# Patient Record
Sex: Male | Born: 1985 | Race: White | Hispanic: No | Marital: Single | Smoking: Never smoker
Health system: Southern US, Community
[De-identification: ages and names within clinical notes are randomized; demographics above are authoritative.]

## PROBLEM LIST (undated history)

## (undated) DIAGNOSIS — F909 Attention-deficit hyperactivity disorder, unspecified type: Secondary | ICD-10-CM

## (undated) DIAGNOSIS — L309 Dermatitis, unspecified: Secondary | ICD-10-CM

## (undated) DIAGNOSIS — J45909 Unspecified asthma, uncomplicated: Secondary | ICD-10-CM

## (undated) HISTORY — DX: Unspecified asthma, uncomplicated: J45.909

## (undated) HISTORY — DX: Dermatitis, unspecified: L30.9

---

## 2007-08-07 HISTORY — PX: WISDOM TOOTH EXTRACTION: SHX21

## 2010-10-07 ENCOUNTER — Emergency Department (HOSPITAL_COMMUNITY)
Admission: EM | Admit: 2010-10-07 | Discharge: 2010-10-07 | Disposition: A | Discharge status: Home / Self Care | Payer: Medicare Other | Attending: Emergency Medicine | Admitting: Emergency Medicine

## 2010-10-07 DIAGNOSIS — F410 Panic disorder [episodic paroxysmal anxiety] without agoraphobia: Secondary | ICD-10-CM | POA: Insufficient documentation

## 2010-10-07 DIAGNOSIS — F79 Unspecified intellectual disabilities: Secondary | ICD-10-CM | POA: Insufficient documentation

## 2010-10-07 DIAGNOSIS — K6289 Other specified diseases of anus and rectum: Secondary | ICD-10-CM | POA: Insufficient documentation

## 2011-07-04 ENCOUNTER — Emergency Department (HOSPITAL_COMMUNITY)
Admission: EM | Admit: 2011-07-04 | Discharge: 2011-07-04 | Disposition: A | Discharge status: Home / Self Care | Payer: Medicare Other | Attending: Emergency Medicine | Admitting: Emergency Medicine

## 2011-07-04 ENCOUNTER — Encounter: Payer: Self-pay | Admitting: *Deleted

## 2011-07-04 DIAGNOSIS — G4700 Insomnia, unspecified: Secondary | ICD-10-CM | POA: Insufficient documentation

## 2011-07-04 DIAGNOSIS — F840 Autistic disorder: Secondary | ICD-10-CM | POA: Insufficient documentation

## 2011-07-04 DIAGNOSIS — F411 Generalized anxiety disorder: Secondary | ICD-10-CM | POA: Insufficient documentation

## 2011-07-04 MED ORDER — ACETAMINOPHEN 325 MG PO TABS
650.0000 mg | ORAL_TABLET | Freq: Once | ORAL | Status: AC
  Administered 2011-07-04: 650 mg via ORAL
  Filled 2011-07-04: qty 2

## 2011-07-04 MED ORDER — LORAZEPAM 1 MG PO TABS
1.0000 mg | ORAL_TABLET | Freq: Once | ORAL | Status: AC
  Administered 2011-07-04: 1 mg via ORAL
  Filled 2011-07-04: qty 1

## 2011-07-04 MED ORDER — LORAZEPAM 1 MG PO TABS: ORAL_TABLET | ORAL | Status: DC

## 2011-07-04 NOTE — ED Provider Notes (Signed)
History     CSN: [id number] Arrival date & time: 07/04/2011 12:45 AM   First MD Initiated Contact with Patient 07/04/11 0103      Chief Complaint  Patient presents with  . Anxiety    (Consider location/radiation/quality/duration/timing/severity/associated sxs/prior treatment) Patient is a 25 y.o. male presenting with anxiety. The history is provided by the patient.  Anxiety This is a recurrent problem. Episode onset: He reports anxiety everyday because he has trouble sleeping at night. The problem occurs daily. The problem has been unchanged. Associated symptoms include headaches. Pertinent negatives include no abdominal pain, chills, fever, nausea or neck pain. The symptoms are aggravated by nothing. Treatments tried: He takes Zoloft daily but reports he does not feel better with regular use of this medication.    Past Medical History  Diagnosis Date  . Autistic disorder     History reviewed. No pertinent past surgical history.  History reviewed. No pertinent family history.  History  Substance Use Topics  . Smoking status: Never Smoker   . Smokeless tobacco: Not on file  . Alcohol Use: No      Review of Systems  Constitutional: Negative.  Negative for fever and chills.  HENT: Positive for rhinorrhea. Negative for neck pain.   Respiratory: Negative.   Cardiovascular: Negative.   Gastrointestinal: Negative.  Negative for nausea and abdominal pain.  Musculoskeletal: Negative.   Skin: Negative.   Neurological: Positive for headaches.  Psychiatric/Behavioral: Positive for sleep disturbance.    Allergies  Eggs or egg-derived products; Peanut-containing drug products; and Shellfish allergy  Home Medications   Current Outpatient Rx  Name Route Sig Dispense Refill  . ZOLOFT PO Oral Take by mouth.        BP 137/88  Pulse 72  Temp(Src) 97.6 F (36.4 C) (Oral)  Resp 18  SpO2 97%  Physical Exam  Constitutional: He is oriented to person, place, and time. He  appears well-developed and well-nourished.  HENT:  Head: Normocephalic.  Nose: Rhinorrhea present.  Eyes: Right conjunctiva is injected.  Neck: Normal range of motion. Neck supple.  Cardiovascular: Normal rate and regular rhythm.   Pulmonary/Chest: Effort normal and breath sounds normal.  Abdominal: Soft. Bowel sounds are normal. There is no tenderness. There is no rebound and no guarding.  Musculoskeletal: Normal range of motion.  Neurological: He is alert and oriented to person, place, and time. No cranial nerve deficit. Coordination normal.  Skin: Skin is warm and dry. No rash noted.  Psychiatric: He has a normal mood and affect.    ED Course  Procedures (including critical care time)  Labs Reviewed - No data to display No results found.   No diagnosis found.    MDM  He appears NAD. He is autistic and a fair historian. His vital signs are normal, he does not appear ill. Feel he can have Tylenol for his headache and will give #3 Ativan 1 mg to aid sleep. He will follow up with his local PCP for further management of difficulty sleeping and anxiety.         Rodena Medin, PA 07/04/11 [phone]

## 2011-07-04 NOTE — ED Notes (Signed)
Pt in stating he had an anxiety attack after a fight with his mom, pt is autistic, in via [place] police

## 2011-07-04 NOTE — ED Provider Notes (Signed)
Medical screening examination/treatment/procedure(s) were performed by non-physician practitioner and as supervising physician I was immediately available for consultation/collaboration.   Vida Roller, MD 07/04/11 [phone]

## 2011-08-21 DIAGNOSIS — F909 Attention-deficit hyperactivity disorder, unspecified type: Secondary | ICD-10-CM | POA: Diagnosis not present

## 2011-08-21 DIAGNOSIS — F411 Generalized anxiety disorder: Secondary | ICD-10-CM | POA: Diagnosis not present

## 2011-08-21 DIAGNOSIS — F848 Other pervasive developmental disorders: Secondary | ICD-10-CM | POA: Diagnosis not present

## 2011-08-28 DIAGNOSIS — F411 Generalized anxiety disorder: Secondary | ICD-10-CM | POA: Diagnosis not present

## 2011-08-28 DIAGNOSIS — F909 Attention-deficit hyperactivity disorder, unspecified type: Secondary | ICD-10-CM | POA: Diagnosis not present

## 2011-08-28 DIAGNOSIS — F848 Other pervasive developmental disorders: Secondary | ICD-10-CM | POA: Diagnosis not present

## 2011-09-04 DIAGNOSIS — F848 Other pervasive developmental disorders: Secondary | ICD-10-CM | POA: Diagnosis not present

## 2011-09-04 DIAGNOSIS — F411 Generalized anxiety disorder: Secondary | ICD-10-CM | POA: Diagnosis not present

## 2011-09-04 DIAGNOSIS — F909 Attention-deficit hyperactivity disorder, unspecified type: Secondary | ICD-10-CM | POA: Diagnosis not present

## 2011-09-11 DIAGNOSIS — F848 Other pervasive developmental disorders: Secondary | ICD-10-CM | POA: Diagnosis not present

## 2011-09-11 DIAGNOSIS — F411 Generalized anxiety disorder: Secondary | ICD-10-CM | POA: Diagnosis not present

## 2011-09-11 DIAGNOSIS — F909 Attention-deficit hyperactivity disorder, unspecified type: Secondary | ICD-10-CM | POA: Diagnosis not present

## 2011-09-13 DIAGNOSIS — F411 Generalized anxiety disorder: Secondary | ICD-10-CM | POA: Diagnosis not present

## 2011-09-13 DIAGNOSIS — F848 Other pervasive developmental disorders: Secondary | ICD-10-CM | POA: Diagnosis not present

## 2011-09-13 DIAGNOSIS — F909 Attention-deficit hyperactivity disorder, unspecified type: Secondary | ICD-10-CM | POA: Diagnosis not present

## 2011-09-18 DIAGNOSIS — F411 Generalized anxiety disorder: Secondary | ICD-10-CM | POA: Diagnosis not present

## 2011-09-18 DIAGNOSIS — F909 Attention-deficit hyperactivity disorder, unspecified type: Secondary | ICD-10-CM | POA: Diagnosis not present

## 2011-09-18 DIAGNOSIS — F848 Other pervasive developmental disorders: Secondary | ICD-10-CM | POA: Diagnosis not present

## 2011-09-24 DIAGNOSIS — F909 Attention-deficit hyperactivity disorder, unspecified type: Secondary | ICD-10-CM | POA: Diagnosis not present

## 2011-09-24 DIAGNOSIS — F411 Generalized anxiety disorder: Secondary | ICD-10-CM | POA: Diagnosis not present

## 2011-09-26 DIAGNOSIS — F848 Other pervasive developmental disorders: Secondary | ICD-10-CM | POA: Diagnosis not present

## 2011-09-26 DIAGNOSIS — F411 Generalized anxiety disorder: Secondary | ICD-10-CM | POA: Diagnosis not present

## 2011-09-26 DIAGNOSIS — F909 Attention-deficit hyperactivity disorder, unspecified type: Secondary | ICD-10-CM | POA: Diagnosis not present

## 2011-10-03 DIAGNOSIS — F411 Generalized anxiety disorder: Secondary | ICD-10-CM | POA: Diagnosis not present

## 2011-10-03 DIAGNOSIS — F848 Other pervasive developmental disorders: Secondary | ICD-10-CM | POA: Diagnosis not present

## 2011-10-03 DIAGNOSIS — F909 Attention-deficit hyperactivity disorder, unspecified type: Secondary | ICD-10-CM | POA: Diagnosis not present

## 2011-10-10 DIAGNOSIS — F909 Attention-deficit hyperactivity disorder, unspecified type: Secondary | ICD-10-CM | POA: Diagnosis not present

## 2011-10-10 DIAGNOSIS — F848 Other pervasive developmental disorders: Secondary | ICD-10-CM | POA: Diagnosis not present

## 2011-10-10 DIAGNOSIS — F411 Generalized anxiety disorder: Secondary | ICD-10-CM | POA: Diagnosis not present

## 2011-10-11 DIAGNOSIS — F909 Attention-deficit hyperactivity disorder, unspecified type: Secondary | ICD-10-CM | POA: Diagnosis not present

## 2011-10-11 DIAGNOSIS — F848 Other pervasive developmental disorders: Secondary | ICD-10-CM | POA: Diagnosis not present

## 2011-10-11 DIAGNOSIS — F411 Generalized anxiety disorder: Secondary | ICD-10-CM | POA: Diagnosis not present

## 2011-10-24 DIAGNOSIS — F909 Attention-deficit hyperactivity disorder, unspecified type: Secondary | ICD-10-CM | POA: Diagnosis not present

## 2011-10-24 DIAGNOSIS — F848 Other pervasive developmental disorders: Secondary | ICD-10-CM | POA: Diagnosis not present

## 2011-10-24 DIAGNOSIS — F411 Generalized anxiety disorder: Secondary | ICD-10-CM | POA: Diagnosis not present

## 2011-10-31 DIAGNOSIS — F411 Generalized anxiety disorder: Secondary | ICD-10-CM | POA: Diagnosis not present

## 2011-10-31 DIAGNOSIS — F848 Other pervasive developmental disorders: Secondary | ICD-10-CM | POA: Diagnosis not present

## 2011-10-31 DIAGNOSIS — F909 Attention-deficit hyperactivity disorder, unspecified type: Secondary | ICD-10-CM | POA: Diagnosis not present

## 2011-11-07 DIAGNOSIS — F848 Other pervasive developmental disorders: Secondary | ICD-10-CM | POA: Diagnosis not present

## 2011-11-07 DIAGNOSIS — F411 Generalized anxiety disorder: Secondary | ICD-10-CM | POA: Diagnosis not present

## 2011-11-07 DIAGNOSIS — F909 Attention-deficit hyperactivity disorder, unspecified type: Secondary | ICD-10-CM | POA: Diagnosis not present

## 2011-11-08 DIAGNOSIS — F411 Generalized anxiety disorder: Secondary | ICD-10-CM | POA: Diagnosis not present

## 2011-11-08 DIAGNOSIS — F909 Attention-deficit hyperactivity disorder, unspecified type: Secondary | ICD-10-CM | POA: Diagnosis not present

## 2011-11-08 DIAGNOSIS — F848 Other pervasive developmental disorders: Secondary | ICD-10-CM | POA: Diagnosis not present

## 2011-11-14 DIAGNOSIS — F411 Generalized anxiety disorder: Secondary | ICD-10-CM | POA: Diagnosis not present

## 2011-11-14 DIAGNOSIS — F848 Other pervasive developmental disorders: Secondary | ICD-10-CM | POA: Diagnosis not present

## 2011-11-14 DIAGNOSIS — F909 Attention-deficit hyperactivity disorder, unspecified type: Secondary | ICD-10-CM | POA: Diagnosis not present

## 2011-11-21 DIAGNOSIS — F909 Attention-deficit hyperactivity disorder, unspecified type: Secondary | ICD-10-CM | POA: Diagnosis not present

## 2011-11-21 DIAGNOSIS — F848 Other pervasive developmental disorders: Secondary | ICD-10-CM | POA: Diagnosis not present

## 2011-11-21 DIAGNOSIS — F411 Generalized anxiety disorder: Secondary | ICD-10-CM | POA: Diagnosis not present

## 2011-11-28 DIAGNOSIS — F411 Generalized anxiety disorder: Secondary | ICD-10-CM | POA: Diagnosis not present

## 2011-11-28 DIAGNOSIS — F909 Attention-deficit hyperactivity disorder, unspecified type: Secondary | ICD-10-CM | POA: Diagnosis not present

## 2011-11-28 DIAGNOSIS — F848 Other pervasive developmental disorders: Secondary | ICD-10-CM | POA: Diagnosis not present

## 2011-12-05 DIAGNOSIS — F848 Other pervasive developmental disorders: Secondary | ICD-10-CM | POA: Diagnosis not present

## 2011-12-05 DIAGNOSIS — F909 Attention-deficit hyperactivity disorder, unspecified type: Secondary | ICD-10-CM | POA: Diagnosis not present

## 2011-12-05 DIAGNOSIS — F411 Generalized anxiety disorder: Secondary | ICD-10-CM | POA: Diagnosis not present

## 2011-12-06 DIAGNOSIS — F848 Other pervasive developmental disorders: Secondary | ICD-10-CM | POA: Diagnosis not present

## 2011-12-06 DIAGNOSIS — F411 Generalized anxiety disorder: Secondary | ICD-10-CM | POA: Diagnosis not present

## 2011-12-06 DIAGNOSIS — F909 Attention-deficit hyperactivity disorder, unspecified type: Secondary | ICD-10-CM | POA: Diagnosis not present

## 2011-12-24 DIAGNOSIS — F909 Attention-deficit hyperactivity disorder, unspecified type: Secondary | ICD-10-CM | POA: Diagnosis not present

## 2011-12-24 DIAGNOSIS — F411 Generalized anxiety disorder: Secondary | ICD-10-CM | POA: Diagnosis not present

## 2012-01-04 DIAGNOSIS — F411 Generalized anxiety disorder: Secondary | ICD-10-CM | POA: Diagnosis not present

## 2012-01-04 DIAGNOSIS — F909 Attention-deficit hyperactivity disorder, unspecified type: Secondary | ICD-10-CM | POA: Diagnosis not present

## 2012-01-09 DIAGNOSIS — F411 Generalized anxiety disorder: Secondary | ICD-10-CM | POA: Diagnosis not present

## 2012-01-09 DIAGNOSIS — F909 Attention-deficit hyperactivity disorder, unspecified type: Secondary | ICD-10-CM | POA: Diagnosis not present

## 2012-01-09 DIAGNOSIS — F988 Other specified behavioral and emotional disorders with onset usually occurring in childhood and adolescence: Secondary | ICD-10-CM | POA: Diagnosis not present

## 2012-01-09 DIAGNOSIS — Z0389 Encounter for observation for other suspected diseases and conditions ruled out: Secondary | ICD-10-CM | POA: Diagnosis not present

## 2012-01-18 DIAGNOSIS — F909 Attention-deficit hyperactivity disorder, unspecified type: Secondary | ICD-10-CM | POA: Diagnosis not present

## 2012-01-18 DIAGNOSIS — F988 Other specified behavioral and emotional disorders with onset usually occurring in childhood and adolescence: Secondary | ICD-10-CM | POA: Diagnosis not present

## 2012-01-18 DIAGNOSIS — F411 Generalized anxiety disorder: Secondary | ICD-10-CM | POA: Diagnosis not present

## 2012-01-18 DIAGNOSIS — Z0389 Encounter for observation for other suspected diseases and conditions ruled out: Secondary | ICD-10-CM | POA: Diagnosis not present

## 2012-02-01 DIAGNOSIS — F988 Other specified behavioral and emotional disorders with onset usually occurring in childhood and adolescence: Secondary | ICD-10-CM | POA: Diagnosis not present

## 2012-02-01 DIAGNOSIS — F411 Generalized anxiety disorder: Secondary | ICD-10-CM | POA: Diagnosis not present

## 2012-02-01 DIAGNOSIS — F909 Attention-deficit hyperactivity disorder, unspecified type: Secondary | ICD-10-CM | POA: Diagnosis not present

## 2012-02-15 DIAGNOSIS — F411 Generalized anxiety disorder: Secondary | ICD-10-CM | POA: Diagnosis not present

## 2012-02-15 DIAGNOSIS — F909 Attention-deficit hyperactivity disorder, unspecified type: Secondary | ICD-10-CM | POA: Diagnosis not present

## 2012-02-15 DIAGNOSIS — F988 Other specified behavioral and emotional disorders with onset usually occurring in childhood and adolescence: Secondary | ICD-10-CM | POA: Diagnosis not present

## 2012-03-10 DIAGNOSIS — Z0000 Encounter for general adult medical examination without abnormal findings: Secondary | ICD-10-CM | POA: Diagnosis not present

## 2012-03-10 DIAGNOSIS — I4510 Unspecified right bundle-branch block: Secondary | ICD-10-CM | POA: Diagnosis not present

## 2012-03-10 DIAGNOSIS — J309 Allergic rhinitis, unspecified: Secondary | ICD-10-CM | POA: Diagnosis not present

## 2012-03-10 DIAGNOSIS — Z13220 Encounter for screening for lipoid disorders: Secondary | ICD-10-CM | POA: Diagnosis not present

## 2012-03-10 DIAGNOSIS — Z79899 Other long term (current) drug therapy: Secondary | ICD-10-CM | POA: Diagnosis not present

## 2012-03-10 DIAGNOSIS — Z23 Encounter for immunization: Secondary | ICD-10-CM | POA: Diagnosis not present

## 2012-03-10 DIAGNOSIS — L2089 Other atopic dermatitis: Secondary | ICD-10-CM | POA: Diagnosis not present

## 2012-03-10 DIAGNOSIS — J45909 Unspecified asthma, uncomplicated: Secondary | ICD-10-CM | POA: Diagnosis not present

## 2012-03-10 DIAGNOSIS — F88 Other disorders of psychological development: Secondary | ICD-10-CM | POA: Diagnosis not present

## 2012-03-10 DIAGNOSIS — K5900 Constipation, unspecified: Secondary | ICD-10-CM | POA: Diagnosis not present

## 2012-03-10 DIAGNOSIS — F909 Attention-deficit hyperactivity disorder, unspecified type: Secondary | ICD-10-CM | POA: Diagnosis not present

## 2012-03-13 DIAGNOSIS — T169XXA Foreign body in ear, unspecified ear, initial encounter: Secondary | ICD-10-CM | POA: Diagnosis not present

## 2012-03-13 DIAGNOSIS — H6120 Impacted cerumen, unspecified ear: Secondary | ICD-10-CM | POA: Diagnosis not present

## 2012-03-13 DIAGNOSIS — Z91010 Allergy to peanuts: Secondary | ICD-10-CM | POA: Diagnosis not present

## 2012-03-14 DIAGNOSIS — F988 Other specified behavioral and emotional disorders with onset usually occurring in childhood and adolescence: Secondary | ICD-10-CM | POA: Diagnosis not present

## 2012-03-14 DIAGNOSIS — F411 Generalized anxiety disorder: Secondary | ICD-10-CM | POA: Diagnosis not present

## 2012-03-20 DIAGNOSIS — F909 Attention-deficit hyperactivity disorder, unspecified type: Secondary | ICD-10-CM | POA: Diagnosis not present

## 2012-03-20 DIAGNOSIS — F411 Generalized anxiety disorder: Secondary | ICD-10-CM | POA: Diagnosis not present

## 2012-03-25 DIAGNOSIS — T7800XA Anaphylactic reaction due to unspecified food, initial encounter: Secondary | ICD-10-CM | POA: Diagnosis not present

## 2012-03-25 DIAGNOSIS — J309 Allergic rhinitis, unspecified: Secondary | ICD-10-CM | POA: Diagnosis not present

## 2012-03-25 DIAGNOSIS — J45909 Unspecified asthma, uncomplicated: Secondary | ICD-10-CM | POA: Diagnosis not present

## 2012-03-28 DIAGNOSIS — F411 Generalized anxiety disorder: Secondary | ICD-10-CM | POA: Diagnosis not present

## 2012-03-28 DIAGNOSIS — F988 Other specified behavioral and emotional disorders with onset usually occurring in childhood and adolescence: Secondary | ICD-10-CM | POA: Diagnosis not present

## 2012-03-28 DIAGNOSIS — T7800XA Anaphylactic reaction due to unspecified food, initial encounter: Secondary | ICD-10-CM | POA: Diagnosis not present

## 2012-03-28 DIAGNOSIS — J45909 Unspecified asthma, uncomplicated: Secondary | ICD-10-CM | POA: Diagnosis not present

## 2012-03-28 DIAGNOSIS — J309 Allergic rhinitis, unspecified: Secondary | ICD-10-CM | POA: Diagnosis not present

## 2012-04-11 DIAGNOSIS — F988 Other specified behavioral and emotional disorders with onset usually occurring in childhood and adolescence: Secondary | ICD-10-CM | POA: Diagnosis not present

## 2012-04-20 DIAGNOSIS — Z23 Encounter for immunization: Secondary | ICD-10-CM | POA: Diagnosis not present

## 2012-04-25 DIAGNOSIS — Z0389 Encounter for observation for other suspected diseases and conditions ruled out: Secondary | ICD-10-CM | POA: Diagnosis not present

## 2012-04-25 DIAGNOSIS — F988 Other specified behavioral and emotional disorders with onset usually occurring in childhood and adolescence: Secondary | ICD-10-CM | POA: Diagnosis not present

## 2012-05-09 DIAGNOSIS — Z0389 Encounter for observation for other suspected diseases and conditions ruled out: Secondary | ICD-10-CM | POA: Diagnosis not present

## 2012-05-09 DIAGNOSIS — F988 Other specified behavioral and emotional disorders with onset usually occurring in childhood and adolescence: Secondary | ICD-10-CM | POA: Diagnosis not present

## 2012-05-27 DIAGNOSIS — J309 Allergic rhinitis, unspecified: Secondary | ICD-10-CM | POA: Diagnosis not present

## 2012-05-27 DIAGNOSIS — J45909 Unspecified asthma, uncomplicated: Secondary | ICD-10-CM | POA: Diagnosis not present

## 2012-05-28 DIAGNOSIS — L259 Unspecified contact dermatitis, unspecified cause: Secondary | ICD-10-CM | POA: Diagnosis not present

## 2012-05-28 DIAGNOSIS — L089 Local infection of the skin and subcutaneous tissue, unspecified: Secondary | ICD-10-CM | POA: Diagnosis not present

## 2012-06-06 DIAGNOSIS — F848 Other pervasive developmental disorders: Secondary | ICD-10-CM | POA: Diagnosis not present

## 2012-06-20 DIAGNOSIS — F848 Other pervasive developmental disorders: Secondary | ICD-10-CM | POA: Diagnosis not present

## 2012-06-20 DIAGNOSIS — Z0389 Encounter for observation for other suspected diseases and conditions ruled out: Secondary | ICD-10-CM | POA: Diagnosis not present

## 2012-08-05 DIAGNOSIS — J45901 Unspecified asthma with (acute) exacerbation: Secondary | ICD-10-CM | POA: Diagnosis not present

## 2012-08-07 DIAGNOSIS — L909 Atrophic disorder of skin, unspecified: Secondary | ICD-10-CM | POA: Diagnosis not present

## 2012-08-07 DIAGNOSIS — H5210 Myopia, unspecified eye: Secondary | ICD-10-CM | POA: Diagnosis not present

## 2012-08-15 DIAGNOSIS — Z0389 Encounter for observation for other suspected diseases and conditions ruled out: Secondary | ICD-10-CM | POA: Diagnosis not present

## 2012-08-15 DIAGNOSIS — F848 Other pervasive developmental disorders: Secondary | ICD-10-CM | POA: Diagnosis not present

## 2012-08-29 DIAGNOSIS — F848 Other pervasive developmental disorders: Secondary | ICD-10-CM | POA: Diagnosis not present

## 2012-08-29 DIAGNOSIS — Z0389 Encounter for observation for other suspected diseases and conditions ruled out: Secondary | ICD-10-CM | POA: Diagnosis not present

## 2012-09-12 DIAGNOSIS — F848 Other pervasive developmental disorders: Secondary | ICD-10-CM | POA: Diagnosis not present

## 2012-09-22 DIAGNOSIS — F909 Attention-deficit hyperactivity disorder, unspecified type: Secondary | ICD-10-CM | POA: Diagnosis not present

## 2012-09-22 DIAGNOSIS — F411 Generalized anxiety disorder: Secondary | ICD-10-CM | POA: Diagnosis not present

## 2012-11-06 DIAGNOSIS — L0233 Carbuncle of buttock: Secondary | ICD-10-CM | POA: Diagnosis not present

## 2012-11-06 DIAGNOSIS — L259 Unspecified contact dermatitis, unspecified cause: Secondary | ICD-10-CM | POA: Diagnosis not present

## 2012-12-22 DIAGNOSIS — F411 Generalized anxiety disorder: Secondary | ICD-10-CM | POA: Diagnosis not present

## 2012-12-22 DIAGNOSIS — F909 Attention-deficit hyperactivity disorder, unspecified type: Secondary | ICD-10-CM | POA: Diagnosis not present

## 2013-03-16 DIAGNOSIS — Q828 Other specified congenital malformations of skin: Secondary | ICD-10-CM | POA: Diagnosis not present

## 2013-03-16 DIAGNOSIS — L218 Other seborrheic dermatitis: Secondary | ICD-10-CM | POA: Diagnosis not present

## 2013-03-16 DIAGNOSIS — L2089 Other atopic dermatitis: Secondary | ICD-10-CM | POA: Diagnosis not present

## 2013-03-18 DIAGNOSIS — F411 Generalized anxiety disorder: Secondary | ICD-10-CM | POA: Diagnosis not present

## 2013-03-18 DIAGNOSIS — F909 Attention-deficit hyperactivity disorder, unspecified type: Secondary | ICD-10-CM | POA: Diagnosis not present

## 2013-03-19 DIAGNOSIS — E785 Hyperlipidemia, unspecified: Secondary | ICD-10-CM | POA: Diagnosis not present

## 2013-03-19 DIAGNOSIS — J45909 Unspecified asthma, uncomplicated: Secondary | ICD-10-CM | POA: Diagnosis not present

## 2013-03-19 DIAGNOSIS — L2089 Other atopic dermatitis: Secondary | ICD-10-CM | POA: Diagnosis not present

## 2013-03-19 DIAGNOSIS — F88 Other disorders of psychological development: Secondary | ICD-10-CM | POA: Diagnosis not present

## 2013-03-19 DIAGNOSIS — I4510 Unspecified right bundle-branch block: Secondary | ICD-10-CM | POA: Diagnosis not present

## 2013-03-19 DIAGNOSIS — Z79899 Other long term (current) drug therapy: Secondary | ICD-10-CM | POA: Diagnosis not present

## 2013-03-19 DIAGNOSIS — Z0000 Encounter for general adult medical examination without abnormal findings: Secondary | ICD-10-CM | POA: Diagnosis not present

## 2013-03-19 DIAGNOSIS — J309 Allergic rhinitis, unspecified: Secondary | ICD-10-CM | POA: Diagnosis not present

## 2013-03-19 DIAGNOSIS — K5900 Constipation, unspecified: Secondary | ICD-10-CM | POA: Diagnosis not present

## 2013-03-20 DIAGNOSIS — Z23 Encounter for immunization: Secondary | ICD-10-CM | POA: Diagnosis not present

## 2013-05-05 DIAGNOSIS — H5210 Myopia, unspecified eye: Secondary | ICD-10-CM | POA: Diagnosis not present

## 2013-05-05 DIAGNOSIS — H52209 Unspecified astigmatism, unspecified eye: Secondary | ICD-10-CM | POA: Diagnosis not present

## 2013-05-21 DIAGNOSIS — K921 Melena: Secondary | ICD-10-CM | POA: Diagnosis not present

## 2013-05-21 DIAGNOSIS — K5900 Constipation, unspecified: Secondary | ICD-10-CM | POA: Diagnosis not present

## 2013-05-29 DIAGNOSIS — K921 Melena: Secondary | ICD-10-CM | POA: Diagnosis not present

## 2013-05-29 DIAGNOSIS — K5900 Constipation, unspecified: Secondary | ICD-10-CM | POA: Diagnosis not present

## 2013-06-14 DIAGNOSIS — J069 Acute upper respiratory infection, unspecified: Secondary | ICD-10-CM | POA: Diagnosis not present

## 2013-06-15 DIAGNOSIS — F411 Generalized anxiety disorder: Secondary | ICD-10-CM | POA: Diagnosis not present

## 2013-06-15 DIAGNOSIS — F909 Attention-deficit hyperactivity disorder, unspecified type: Secondary | ICD-10-CM | POA: Diagnosis not present

## 2013-10-12 DIAGNOSIS — F411 Generalized anxiety disorder: Secondary | ICD-10-CM | POA: Diagnosis not present

## 2013-10-12 DIAGNOSIS — F909 Attention-deficit hyperactivity disorder, unspecified type: Secondary | ICD-10-CM | POA: Diagnosis not present

## 2013-12-12 ENCOUNTER — Encounter (HOSPITAL_COMMUNITY): Payer: Self-pay | Admitting: Emergency Medicine

## 2013-12-12 ENCOUNTER — Emergency Department (HOSPITAL_COMMUNITY)
Admission: EM | Admit: 2013-12-12 | Discharge: 2013-12-12 | Disposition: A | Discharge status: Home / Self Care | Payer: Medicare Other | Attending: Emergency Medicine | Admitting: Emergency Medicine

## 2013-12-12 DIAGNOSIS — F909 Attention-deficit hyperactivity disorder, unspecified type: Secondary | ICD-10-CM | POA: Insufficient documentation

## 2013-12-12 DIAGNOSIS — IMO0002 Reserved for concepts with insufficient information to code with codable children: Secondary | ICD-10-CM | POA: Insufficient documentation

## 2013-12-12 DIAGNOSIS — F840 Autistic disorder: Secondary | ICD-10-CM | POA: Diagnosis not present

## 2013-12-12 DIAGNOSIS — R064 Hyperventilation: Secondary | ICD-10-CM | POA: Diagnosis not present

## 2013-12-12 DIAGNOSIS — Z79899 Other long term (current) drug therapy: Secondary | ICD-10-CM | POA: Diagnosis not present

## 2013-12-12 DIAGNOSIS — F411 Generalized anxiety disorder: Secondary | ICD-10-CM | POA: Diagnosis not present

## 2013-12-12 DIAGNOSIS — F419 Anxiety disorder, unspecified: Secondary | ICD-10-CM

## 2013-12-12 HISTORY — DX: Attention-deficit hyperactivity disorder, unspecified type: F90.9

## 2013-12-12 MED ORDER — LORAZEPAM 0.5 MG PO TABS: 1.0000 mg | ORAL_TABLET | Freq: Three times a day (TID) | ORAL | Status: DC

## 2013-12-12 NOTE — Discharge Instructions (Signed)
Trey PaulaJeff has a new Rx for anxiety. (Ativan 0.5mg  three times per day) Give Jeff 1 tablet of his Clonazepam in the morning, then fill the Ativan prescription and start him on a dose mid day) [place] further instructions.  Panic Attacks Panic attacks are sudden, short feelings of great fear or discomfort. You may have them for no reason when you are relaxed, when you are uneasy (anxious), or when you are sleeping.  HOME CARE  Take all your medicines as told.  Check with your doctor before starting new medicines.  Keep all doctor visits. GET HELP IF:  You are not able to take your medicines as told.  Your symptoms do not get better.  Your symptoms get worse. GET HELP RIGHT AWAY IF:  Your attacks seem different than your normal attacks.  You have thoughts about hurting yourself or others.  You take panic attack medicine and you have a side effect. MAKE SURE YOU:  Understand these instructions.  Will watch your condition.  Will get help right away if you are not doing well or get worse. Document Released: 08/25/2010 Document Revised: 05/13/2013 Document Reviewed: 03/06/2013 [facility] Patient Information 2014 [place] [place].

## 2013-12-12 NOTE — ED Notes (Signed)
Patient is alert and oriented x3.  He is with family that states he has had a "fit" today because he wanted to go  Back to where he lives.  He was visiting family for a weekend visit.  Patient has been having more "fits" since About 2 weeks ago.

## 2013-12-12 NOTE — ED Notes (Signed)
On assessment patient states he has been having dizziness at night and having trouble sleeping. Patient has "fit" tonight father states he was throwing things.

## 2013-12-13 NOTE — ED Provider Notes (Signed)
CSN: [id number]     Arrival date & time 12/12/13  2050 History   First MD Initiated Contact with Patient 12/12/13 2116     Chief Complaint  Patient presents with  . Anxiety      HPI  Patient presents with his mother and father. [place]  His history of autism. He is independent. Takes no medications. He is over the weekend. Has had several outbursts over the last few days and weeks where he becomes very anxious. Place when he is being told no. [place] today that he could not collect [place]lee. He became anxious and hyperventilated. Earlier in the week while at camp he was told he cannot use his eye patch he became anxious and hyperventilated. He is eating and functioning otherwise well. Parents were concerned because this is somewhat new behavior for him  Past Medical History  Diagnosis Date  . Autistic disorder   . ADHD (attention deficit hyperactivity disorder)    History reviewed. No pertinent past surgical history. History reviewed. No pertinent family history. History  Substance Use Topics  . Smoking status: Never Smoker   . Smokeless tobacco: Not on file  . Alcohol Use: No    Review of Systems  Constitutional: Negative for fever, chills, diaphoresis, appetite change and fatigue.  HENT: Negative for mouth sores, sore throat and trouble swallowing.   Eyes: Negative for visual disturbance.  Respiratory: Negative for cough, chest tightness, shortness of breath and wheezing.   Cardiovascular: Negative for chest pain.  Gastrointestinal: Negative for nausea, vomiting, abdominal pain, diarrhea and abdominal distention.  Endocrine: Negative for polydipsia, polyphagia and polyuria.  Genitourinary: Negative for dysuria, frequency and hematuria.  Musculoskeletal: Negative for gait problem.  Skin: Negative for color change, pallor and rash.  Neurological: Negative for dizziness, syncope, light-headedness and headaches.  Hematological: Does not  bruise/bleed easily.  Psychiatric/Behavioral: Negative for behavioral problems and confusion. The patient is nervous/anxious.       Allergies  Eggs or egg-derived products; Peanut-containing drug products; and Shellfish allergy  Home Medications   Prior to Admission medications   Medication Sig Start Date End Date Taking? Authorizing Provider  acetaminophen (TYLENOL) 500 MG tablet Take 1,000 mg by mouth every 6 (six) hours as needed for moderate pain.   Yes Historical Provider, MD  albuterol (PROVENTIL HFA;VENTOLIN HFA) 108 (90 BASE) MCG/ACT inhaler Inhale 2 puffs into the lungs every 6 (six) hours as needed for wheezing or shortness of breath.   Yes Historical Provider, MD  albuterol (PROVENTIL) (2.5 MG/3ML) 0.083% nebulizer solution Take 2.5 mg by nebulization every 6 (six) hours as needed for wheezing or shortness of breath.   Yes Historical Provider, MD  cetirizine (ZYRTEC) 10 MG tablet Take 10 mg by mouth daily.   Yes Historical Provider, MD  clindamycin (CLEOCIN T) 1 % SWAB Apply 1 application topically 2 (two) times daily as needed (rash).   Yes Historical Provider, MD  clonazePAM (KLONOPIN) 0.5 MG tablet Take 0.5 mg by mouth once.   Yes Historical Provider, MD  dexmethylphenidate (FOCALIN XR) 20 MG 24 hr capsule Take 20 mg by mouth every morning.   Yes Historical Provider, MD  docusate sodium (COLACE) 100 MG capsule Take 100 mg by mouth daily as needed for mild constipation.   Yes Historical Provider, MD  EPINEPHrine 0.3 mg/0.3 mL IJ SOAJ injection Inject 0.3 mg into the muscle daily as needed (allergic reaction).   Yes Historical Provider, MD  fluticasone (FLONASE) 50 MCG/ACT  nasal spray Place 2 sprays into both nostrils daily.   Yes Historical Provider, MD  Lactobacillus-Inulin (CULTURELLE DIGESTIVE HEALTH PO) Take 1 capsule by mouth daily.   Yes Historical Provider, MD  montelukast (SINGULAIR) 10 MG tablet Take 10 mg by mouth at bedtime.   Yes Historical Provider, MD  Multiple  Vitamin (MULTIVITAMIN WITH MINERALS) TABS tablet Take 1 tablet by mouth daily.   Yes Historical Provider, MD  Omega-3 Fatty Acids (FISH OIL) 1000 MG CAPS Take 1,000 mg by mouth 2 (two) times daily.   Yes Historical Provider, MD  sertraline (ZOLOFT) 100 MG tablet Take 100 mg by mouth daily.   Yes Historical Provider, MD  vitamin C (ASCORBIC ACID) 500 MG tablet Take 500 mg by mouth daily.   Yes Historical Provider, MD  LORazepam (ATIVAN) 0.5 MG tablet Take 2 tablets (1 mg total) by mouth 3 (three) times daily. 12/12/13   Rolland PorterMark Caylor Cerino, MD   BP 125/79  Pulse 81  Temp(Src) 98.3 F (36.8 C) (Oral)  Resp 15  SpO2 97% Physical Exam  Constitutional: He is oriented to person, place, and time. He appears well-developed and well-nourished. No distress.  He is awake alert. He answers simple questions and follows conversation well. He is somewhat anxious.  HENT:  Head: Normocephalic.  Eyes: Conjunctivae are normal. Pupils are equal, round, and reactive to light. No scleral icterus.  Neck: Normal range of motion. Neck supple. No thyromegaly present.  Cardiovascular: Normal rate and regular rhythm.  Exam reveals no gallop and no friction rub.   No murmur heard. Pulmonary/Chest: Effort normal and breath sounds normal. No respiratory distress. He has no wheezes. He has no rales.  Abdominal: Soft. Bowel sounds are normal. He exhibits no distension. There is no tenderness. There is no rebound.  Musculoskeletal: Normal range of motion.  Neurological: He is alert and oriented to person, place, and time.  Skin: Skin is warm and dry. No rash noted.  Psychiatric: He has a normal mood and affect. His behavior is normal.    ED Course  Procedures (including critical care time) Labs Review Labs Reviewed - No data to display  Imaging Review No results found.   EKG Interpretation None      MDM   Final diagnoses:  Anxiety    Given the patient is 0.5 mg Klonopin approximately 2 hours before his arrival  here he is much much better. His medication he had been prescribed several years ago. He cannot take when necessary medications at his facility. I discussed possibilities with the parents being #1prescribe him 3 times a day low-dose Ativan to hopefully this evening on some of his anxiety and discussed with his urologist on Monday. #2  would be to keep her overnight have a psychiatric evaluation and recommendations done in the morning.  Patient with strong preference to simply give him a medicine for his anxiety to discuss his care with [place]. I think this is perfectly appropriate. I've given him a prescription for 0.5 mg Ativan to take 3 times a day until they can follow him up with his physicians.    Rolland PorterMark Vernel Donlan, MD 12/13/13 [phone]

## 2013-12-16 DIAGNOSIS — F909 Attention-deficit hyperactivity disorder, unspecified type: Secondary | ICD-10-CM | POA: Diagnosis not present

## 2013-12-16 DIAGNOSIS — F411 Generalized anxiety disorder: Secondary | ICD-10-CM | POA: Diagnosis not present

## 2013-12-21 DIAGNOSIS — F909 Attention-deficit hyperactivity disorder, unspecified type: Secondary | ICD-10-CM | POA: Diagnosis not present

## 2014-03-17 DIAGNOSIS — F411 Generalized anxiety disorder: Secondary | ICD-10-CM | POA: Diagnosis not present

## 2014-03-17 DIAGNOSIS — F909 Attention-deficit hyperactivity disorder, unspecified type: Secondary | ICD-10-CM | POA: Diagnosis not present

## 2014-03-23 DIAGNOSIS — E785 Hyperlipidemia, unspecified: Secondary | ICD-10-CM | POA: Diagnosis not present

## 2014-03-23 DIAGNOSIS — F909 Attention-deficit hyperactivity disorder, unspecified type: Secondary | ICD-10-CM | POA: Diagnosis not present

## 2014-03-23 DIAGNOSIS — L2089 Other atopic dermatitis: Secondary | ICD-10-CM | POA: Diagnosis not present

## 2014-03-23 DIAGNOSIS — F88 Other disorders of psychological development: Secondary | ICD-10-CM | POA: Diagnosis not present

## 2014-03-23 DIAGNOSIS — J309 Allergic rhinitis, unspecified: Secondary | ICD-10-CM | POA: Diagnosis not present

## 2014-03-23 DIAGNOSIS — I4510 Unspecified right bundle-branch block: Secondary | ICD-10-CM | POA: Diagnosis not present

## 2014-03-23 DIAGNOSIS — J45909 Unspecified asthma, uncomplicated: Secondary | ICD-10-CM | POA: Diagnosis not present

## 2014-03-23 DIAGNOSIS — F411 Generalized anxiety disorder: Secondary | ICD-10-CM | POA: Diagnosis not present

## 2014-03-23 DIAGNOSIS — Z0000 Encounter for general adult medical examination without abnormal findings: Secondary | ICD-10-CM | POA: Diagnosis not present

## 2014-04-15 DIAGNOSIS — Z23 Encounter for immunization: Secondary | ICD-10-CM | POA: Diagnosis not present

## 2014-07-05 DIAGNOSIS — F71 Moderate intellectual disabilities: Secondary | ICD-10-CM | POA: Diagnosis not present

## 2014-12-06 DIAGNOSIS — F71 Moderate intellectual disabilities: Secondary | ICD-10-CM | POA: Diagnosis not present

## 2015-01-16 DIAGNOSIS — S30862A Insect bite (nonvenomous) of penis, initial encounter: Secondary | ICD-10-CM | POA: Diagnosis not present

## 2015-01-16 DIAGNOSIS — R21 Rash and other nonspecific skin eruption: Secondary | ICD-10-CM | POA: Diagnosis not present

## 2015-01-16 DIAGNOSIS — W57XXXA Bitten or stung by nonvenomous insect and other nonvenomous arthropods, initial encounter: Secondary | ICD-10-CM | POA: Diagnosis not present

## 2015-02-02 DIAGNOSIS — H52203 Unspecified astigmatism, bilateral: Secondary | ICD-10-CM | POA: Diagnosis not present

## 2015-02-02 DIAGNOSIS — H10413 Chronic giant papillary conjunctivitis, bilateral: Secondary | ICD-10-CM | POA: Diagnosis not present

## 2015-02-06 DIAGNOSIS — R21 Rash and other nonspecific skin eruption: Secondary | ICD-10-CM | POA: Diagnosis not present

## 2015-03-16 DIAGNOSIS — F71 Moderate intellectual disabilities: Secondary | ICD-10-CM | POA: Diagnosis not present

## 2015-04-19 DIAGNOSIS — E785 Hyperlipidemia, unspecified: Secondary | ICD-10-CM | POA: Diagnosis not present

## 2015-04-19 DIAGNOSIS — K5900 Constipation, unspecified: Secondary | ICD-10-CM | POA: Diagnosis not present

## 2015-04-19 DIAGNOSIS — Z13220 Encounter for screening for lipoid disorders: Secondary | ICD-10-CM | POA: Diagnosis not present

## 2015-04-19 DIAGNOSIS — L2084 Intrinsic (allergic) eczema: Secondary | ICD-10-CM | POA: Diagnosis not present

## 2015-04-19 DIAGNOSIS — Z131 Encounter for screening for diabetes mellitus: Secondary | ICD-10-CM | POA: Diagnosis not present

## 2015-04-19 DIAGNOSIS — J4520 Mild intermittent asthma, uncomplicated: Secondary | ICD-10-CM | POA: Diagnosis not present

## 2015-04-19 DIAGNOSIS — Z0000 Encounter for general adult medical examination without abnormal findings: Secondary | ICD-10-CM | POA: Diagnosis not present

## 2015-04-19 DIAGNOSIS — I4510 Unspecified right bundle-branch block: Secondary | ICD-10-CM | POA: Diagnosis not present

## 2015-04-19 DIAGNOSIS — F902 Attention-deficit hyperactivity disorder, combined type: Secondary | ICD-10-CM | POA: Diagnosis not present

## 2015-04-19 DIAGNOSIS — J309 Allergic rhinitis, unspecified: Secondary | ICD-10-CM | POA: Diagnosis not present

## 2015-04-19 DIAGNOSIS — Z23 Encounter for immunization: Secondary | ICD-10-CM | POA: Diagnosis not present

## 2015-04-19 DIAGNOSIS — M41119 Juvenile idiopathic scoliosis, site unspecified: Secondary | ICD-10-CM | POA: Diagnosis not present

## 2015-05-14 DIAGNOSIS — L309 Dermatitis, unspecified: Secondary | ICD-10-CM | POA: Diagnosis not present

## 2015-05-14 DIAGNOSIS — L089 Local infection of the skin and subcutaneous tissue, unspecified: Secondary | ICD-10-CM | POA: Diagnosis not present

## 2015-05-18 DIAGNOSIS — L089 Local infection of the skin and subcutaneous tissue, unspecified: Secondary | ICD-10-CM | POA: Diagnosis not present

## 2015-05-18 DIAGNOSIS — L309 Dermatitis, unspecified: Secondary | ICD-10-CM | POA: Diagnosis not present

## 2015-06-08 DIAGNOSIS — L309 Dermatitis, unspecified: Secondary | ICD-10-CM | POA: Diagnosis not present

## 2015-06-13 DIAGNOSIS — L700 Acne vulgaris: Secondary | ICD-10-CM | POA: Diagnosis not present

## 2015-06-13 DIAGNOSIS — L218 Other seborrheic dermatitis: Secondary | ICD-10-CM | POA: Diagnosis not present

## 2015-06-13 DIAGNOSIS — L209 Atopic dermatitis, unspecified: Secondary | ICD-10-CM | POA: Diagnosis not present

## 2015-06-20 DIAGNOSIS — F901 Attention-deficit hyperactivity disorder, predominantly hyperactive type: Secondary | ICD-10-CM | POA: Diagnosis not present

## 2015-06-20 DIAGNOSIS — F419 Anxiety disorder, unspecified: Secondary | ICD-10-CM | POA: Diagnosis not present

## 2015-06-20 DIAGNOSIS — F71 Moderate intellectual disabilities: Secondary | ICD-10-CM | POA: Diagnosis not present

## 2015-10-18 DIAGNOSIS — F411 Generalized anxiety disorder: Secondary | ICD-10-CM | POA: Diagnosis not present

## 2015-10-18 DIAGNOSIS — F902 Attention-deficit hyperactivity disorder, combined type: Secondary | ICD-10-CM | POA: Diagnosis not present

## 2015-10-18 DIAGNOSIS — J309 Allergic rhinitis, unspecified: Secondary | ICD-10-CM | POA: Diagnosis not present

## 2015-10-18 DIAGNOSIS — K5900 Constipation, unspecified: Secondary | ICD-10-CM | POA: Diagnosis not present

## 2015-10-18 DIAGNOSIS — J4520 Mild intermittent asthma, uncomplicated: Secondary | ICD-10-CM | POA: Diagnosis not present

## 2015-10-18 DIAGNOSIS — E781 Pure hyperglyceridemia: Secondary | ICD-10-CM | POA: Diagnosis not present

## 2015-10-18 DIAGNOSIS — L2084 Intrinsic (allergic) eczema: Secondary | ICD-10-CM | POA: Diagnosis not present

## 2015-10-31 DIAGNOSIS — F419 Anxiety disorder, unspecified: Secondary | ICD-10-CM | POA: Diagnosis not present

## 2015-12-14 DIAGNOSIS — K5900 Constipation, unspecified: Secondary | ICD-10-CM | POA: Diagnosis not present

## 2016-01-30 DIAGNOSIS — F419 Anxiety disorder, unspecified: Secondary | ICD-10-CM | POA: Diagnosis not present

## 2016-04-26 DIAGNOSIS — F419 Anxiety disorder, unspecified: Secondary | ICD-10-CM | POA: Diagnosis not present

## 2016-05-01 DIAGNOSIS — Z23 Encounter for immunization: Secondary | ICD-10-CM | POA: Diagnosis not present

## 2016-05-01 DIAGNOSIS — I4510 Unspecified right bundle-branch block: Secondary | ICD-10-CM | POA: Diagnosis not present

## 2016-05-01 DIAGNOSIS — Z0000 Encounter for general adult medical examination without abnormal findings: Secondary | ICD-10-CM | POA: Diagnosis not present

## 2016-05-01 DIAGNOSIS — J309 Allergic rhinitis, unspecified: Secondary | ICD-10-CM | POA: Diagnosis not present

## 2016-05-01 DIAGNOSIS — L2084 Intrinsic (allergic) eczema: Secondary | ICD-10-CM | POA: Diagnosis not present

## 2016-05-01 DIAGNOSIS — E785 Hyperlipidemia, unspecified: Secondary | ICD-10-CM | POA: Diagnosis not present

## 2016-07-26 DIAGNOSIS — F419 Anxiety disorder, unspecified: Secondary | ICD-10-CM | POA: Diagnosis not present

## 2016-10-09 DIAGNOSIS — H10413 Chronic giant papillary conjunctivitis, bilateral: Secondary | ICD-10-CM | POA: Diagnosis not present

## 2016-10-30 DIAGNOSIS — F411 Generalized anxiety disorder: Secondary | ICD-10-CM | POA: Diagnosis not present

## 2016-10-30 DIAGNOSIS — E785 Hyperlipidemia, unspecified: Secondary | ICD-10-CM | POA: Diagnosis not present

## 2016-10-30 DIAGNOSIS — J309 Allergic rhinitis, unspecified: Secondary | ICD-10-CM | POA: Diagnosis not present

## 2016-10-30 DIAGNOSIS — R42 Dizziness and giddiness: Secondary | ICD-10-CM | POA: Diagnosis not present

## 2016-11-01 DIAGNOSIS — F419 Anxiety disorder, unspecified: Secondary | ICD-10-CM | POA: Diagnosis not present

## 2016-12-13 DIAGNOSIS — T07XXXA Unspecified multiple injuries, initial encounter: Secondary | ICD-10-CM | POA: Diagnosis not present

## 2016-12-13 DIAGNOSIS — L0390 Cellulitis, unspecified: Secondary | ICD-10-CM | POA: Diagnosis not present

## 2016-12-13 DIAGNOSIS — S90211A Contusion of right great toe with damage to nail, initial encounter: Secondary | ICD-10-CM | POA: Diagnosis not present

## 2016-12-13 DIAGNOSIS — L309 Dermatitis, unspecified: Secondary | ICD-10-CM | POA: Diagnosis not present

## 2017-01-03 DIAGNOSIS — L02519 Cutaneous abscess of unspecified hand: Secondary | ICD-10-CM | POA: Insufficient documentation

## 2017-01-03 DIAGNOSIS — L0291 Cutaneous abscess, unspecified: Secondary | ICD-10-CM | POA: Diagnosis not present

## 2017-01-03 DIAGNOSIS — L02511 Cutaneous abscess of right hand: Secondary | ICD-10-CM | POA: Diagnosis not present

## 2017-01-08 DIAGNOSIS — L02511 Cutaneous abscess of right hand: Secondary | ICD-10-CM | POA: Diagnosis not present

## 2017-01-28 DIAGNOSIS — F419 Anxiety disorder, unspecified: Secondary | ICD-10-CM | POA: Diagnosis not present

## 2017-03-19 ENCOUNTER — Encounter: Payer: Self-pay | Admitting: Allergy and Immunology

## 2017-03-19 ENCOUNTER — Ambulatory Visit (INDEPENDENT_AMBULATORY_CARE_PROVIDER_SITE_OTHER): Payer: Medicare Other | Admitting: Allergy and Immunology

## 2017-03-19 VITALS — BP 122/78 | HR 76 | Temp 97.9°F | Resp 14 | Ht 68.0 in | Wt 178.2 lb

## 2017-03-19 DIAGNOSIS — J302 Other seasonal allergic rhinitis: Secondary | ICD-10-CM | POA: Insufficient documentation

## 2017-03-19 DIAGNOSIS — J4520 Mild intermittent asthma, uncomplicated: Secondary | ICD-10-CM | POA: Diagnosis not present

## 2017-03-19 DIAGNOSIS — J4530 Mild persistent asthma, uncomplicated: Secondary | ICD-10-CM | POA: Insufficient documentation

## 2017-03-19 DIAGNOSIS — L2089 Other atopic dermatitis: Secondary | ICD-10-CM | POA: Insufficient documentation

## 2017-03-19 DIAGNOSIS — J3089 Other allergic rhinitis: Secondary | ICD-10-CM | POA: Diagnosis not present

## 2017-03-19 DIAGNOSIS — L209 Atopic dermatitis, unspecified: Secondary | ICD-10-CM | POA: Insufficient documentation

## 2017-03-19 DIAGNOSIS — T7800XA Anaphylactic reaction due to unspecified food, initial encounter: Secondary | ICD-10-CM | POA: Insufficient documentation

## 2017-03-19 DIAGNOSIS — T7800XD Anaphylactic reaction due to unspecified food, subsequent encounter: Secondary | ICD-10-CM | POA: Insufficient documentation

## 2017-03-19 MED ORDER — TRIAMCINOLONE ACETONIDE 0.1 % EX OINT: TOPICAL_OINTMENT | CUTANEOUS | 5 refills | Status: DC

## 2017-03-19 MED ORDER — DESONIDE 0.05 % EX OINT: TOPICAL_OINTMENT | CUTANEOUS | 5 refills | Status: DC

## 2017-03-19 MED ORDER — FLUTICASONE PROPIONATE 50 MCG/ACT NA SUSP: 2.0000 | Freq: Every day | NASAL | 5 refills | Status: DC

## 2017-03-19 NOTE — Patient Instructions (Addendum)
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   A lab order form has been provided for serum specific IgE against peanut, peanut component, egg, egg component, and shellfish panel.  Careful avoidance of tree nuts and coconut as discussed.  Until peanut, shellfish, and egg allergy have been been definitively ruled out, continue careful avoidance of these foods as well.  A refill prescription has been provided for epinephrine auto-injector 2 pack along with instructions for proper administration.  A food allergy action plan has been provided and discussed.  Medic Alert identification is recommended.  Atopic dermatitis  Appropriate skin care recommendations have been provided verbally and in written form.  A prescription has been provided for desonide 0.05% ointment sparingly to affected areas twice daily as needed to the face and/or neck. Care is to be taken to avoid the eyes.  A prescription has been provided for triamcinolone 0.1% ointment sparingly to affected areas twice daily as needed below the face and neck. Care is to be taken to avoid the axillae and groin area.  Make note of any foods that trigger symptom flares.  Fingernails are to be kept trimmed.  Other allergic rhinitis  Laboratory order form has been provided for serum specific IgE against aeroallergen panel.  To avoid diminishing benefit with daily use (tachyphylaxis), consider alternating every few months between fexofenadine (Allegra) and cetirizine (Zyrtec).  A prescription has been provided for fluticasone nasal spray, 2 sprays per nostril daily as needed. Proper nasal spray technique has been discussed and demonstrated.  I have also recommended nasal saline spray (i.e. Simply Saline) as needed and prior to medicated nasal sprays.  Mild intermittent asthma Well-controlled, we will stepdown therapy at this time.  Discontinue montelukast.   If lower respiratory  symptoms progress in frequency and/or severity, the patient is to restart montelukast.  Continue albuterol HFA, 1-2 inhalations every 4-6 hours as needed.  Subjective and objective measures of pulmonary function will be followed and the treatment plan will be adjusted accordingly.   When lab results have returned the patient will be called with further recommendations and follow up instructions.

## 2017-03-19 NOTE — Assessment & Plan Note (Signed)
   Appropriate skin care recommendations have been provided verbally and in written form.  A prescription has been provided for desonide 0.05% ointment sparingly to affected areas twice daily as needed to the face and/or neck. Care is to be taken to avoid the eyes.  A prescription has been provided for triamcinolone 0.1% ointment sparingly to affected areas twice daily as needed below the face and neck. Care is to be taken to avoid the axillae and groin area.  Make note of any foods that trigger symptom flares.  Fingernails are to be kept trimmed.

## 2017-03-19 NOTE — Progress Notes (Addendum)
New Patient Note  RE: Francisco Cisneros MRN: [id number] DOB: 1985/11/27 Date of Office Visit: 03/19/2017  Referring provider: Farris Has, MD Primary care provider: Farris Has, MD  Chief Complaint: Allergies (Food); Eczema; and Allergic Rhinitis    History of present illness: Francisco Cisneros is a 31 y.o. male seen today in consultation requested by Farris Has, MD.  He is accompanied today by his parents who assist with the history.  He is approximately 31 years old he had anaphylaxis with the consumption of a cookie containing cashews.  He apparently also had a reaction to peanut, though the details are unclear.  He was skin test positive to peanut and tree nut approximately 4 years ago.  He avoids these foods, as well as eggs and shellfish and has access to epinephrine autoinjectors.  His parents are interested in reassessing his food allergy status.  He currently lives in a group home which is supposed to be "nut free", however his parents are skeptical a carefully this is monitored.  He has had atopic dermatitis since early infancy, primarily involving his upper and lower extremities, back, and posterior neck.  This spring, his eyelids were dry, red, and reticulocyte.  He was given Zaditor, however this seemed to dry out his eyes too much and so he switched to Systane eyedrops. He has a diagnosis of asthma and is taking montelukast over the past 4 years.  His parents report that he has only required albuterol rescue one time over the past 4 years and does not experience nocturnal awakenings due to lower respiratory symptoms.  His nasal allergy symptoms are relatively well-controlled.  He has taken cetirizine daily for many years.    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   A lab order form has been provided for serum specific IgE against peanut, peanut component, egg, egg component, and shellfish  panel.  Careful avoidance of tree nuts and coconut as discussed.  Until peanut, shellfish, and egg allergy have been been definitively ruled out, continue careful avoidance of these foods as well.  A refill prescription has been provided for epinephrine auto-injector 2 pack along with instructions for proper administration.  A food allergy action plan has been provided and discussed.  Medic Alert identification is recommended.  Atopic dermatitis  Appropriate skin care recommendations have been provided verbally and in written form.  A prescription has been provided for desonide 0.05% ointment sparingly to affected areas twice daily as needed to the face and/or neck. Care is to be taken to avoid the eyes.  A prescription has been provided for triamcinolone 0.1% ointment sparingly to affected areas twice daily as needed below the face and neck. Care is to be taken to avoid the axillae and groin area.  Make note of any foods that trigger symptom flares.  Fingernails are to be kept trimmed.  Other allergic rhinitis  Laboratory order form has been provided for serum specific IgE against aeroallergen panel.  To avoid diminishing benefit with daily use (tachyphylaxis), consider alternating every few months between fexofenadine (Allegra) and cetirizine (Zyrtec).  A prescription has been provided for fluticasone nasal spray, 2 sprays per nostril daily as needed. Proper nasal spray technique has been discussed and demonstrated.  I have also recommended nasal saline spray (i.e. Simply Saline) as needed and prior to medicated nasal sprays.  Mild intermittent asthma Well-controlled, we will stepdown therapy at this time.  Discontinue montelukast.   If lower  respiratory symptoms progress in frequency and/or severity, the patient is to restart montelukast.  Continue albuterol HFA, 1-2 inhalations every 4-6 hours as needed.  Subjective and objective measures of pulmonary function will be  followed and the treatment plan will be adjusted accordingly.   Meds ordered this encounter  Medications  . desonide (DESOWEN) 0.05 % ointment    Sig: Apply sparingly to affected areas twice daily as needed to the face and/or neck.    Dispense:  15 g    Refill:  5  . triamcinolone ointment (KENALOG) 0.1 %    Sig: Apply sparingly to affected areas twice daily as needed, below the face.    Dispense:  30 g    Refill:  5  . fluticasone (FLONASE) 50 MCG/ACT nasal spray    Sig: Place 2 sprays into both nostrils daily.    Dispense:  16 g    Refill:  5    Diagnostics: Spirometry: FVC was 3.90 L and FEV1 was 3.02 L with an FEV1 ratio of 93%.  There was not significant post bronchodilator improvement. This study was performed while the patient was asymptomatic.  Please see scanned spirometry results for details. Environmental skin testing: Deferred by his parents. Food allergen skin testing: Positive to almond, hazelnut, and coconut.    Physical examination: Blood pressure 122/78, pulse 76, temperature 97.9 F (36.6 C), temperature source Oral, resp. rate 14, height 5\' 8"  (1.727 m), weight 178 lb 3.2 oz (80.8 kg).  General: Alert, interactive, in no acute distress. HEENT: TMs pearly gray, turbinates moderately edematous with thick discharge, post-pharynx moderately erythematous. Neck: Supple without lymphadenopathy. Lungs: Clear to auscultation without wheezing, rhonchi or rales. CV: Normal S1, S2 without murmurs. Abdomen: Nondistended, nontender. Skin: Dry patches on his posterior neck. Extremities:  No clubbing, cyanosis or edema. Neuro:   Grossly intact.  Review of systems:  Review of systems negative except as noted in HPI / PMHx or noted below: Review of Systems  Constitutional: Negative.   HENT: Negative.   Eyes: Negative.   Respiratory: Negative.   Cardiovascular: Negative.   Gastrointestinal: Negative.   Genitourinary: Negative.   Musculoskeletal: Negative.   Skin:  Negative.   Neurological: Negative.   Endo/Heme/Allergies: Negative.   Psychiatric/Behavioral: Negative.     Past medical history:  Past Medical History:  Diagnosis Date  . ADHD (attention deficit hyperactivity disorder)   . Asthma   . Autistic disorder   . Eczema     Past surgical history:  Past Surgical History:  Procedure Laterality Date  . WISDOM TOOTH EXTRACTION  2009    Family history: Family History  Problem Relation Age of Onset  . Eczema Mother   . Allergic rhinitis Neg Hx   . Angioedema Neg Hx   . Asthma Neg Hx   . Immunodeficiency Neg Hx   . Urticaria Neg Hx     Social history: Social History   Social History  . Marital status: Single    Spouse name: N/A  . Number of children: N/A  . Years of education: N/A   Occupational History  . Not on file.   Social History Main Topics  . Smoking status: Never Smoker  . Smokeless tobacco: Never Used  . Alcohol use No  . Drug use: No  . Sexual activity: Not on file   Other Topics Concern  . Not on file   Social History Narrative  . No narrative on file   Environmental History: The patient lives in a house with  wood laminate floors throughout and central air/heat.  There is a dog in the home which has access to his bedroom.  There are no smokers in the household.  Allergies as of 03/19/2017      Reactions   Eggs Or Egg-derived Products Anaphylaxis   Peanut Oil Anaphylaxis   Peanut-containing Drug Products    Shellfish Allergy    Shellfish-derived Products Rash      Medication List       Accurate as of 03/19/17  6:12 PM. Always use your most recent med list.          acetaminophen 500 MG tablet Commonly known as:  TYLENOL Take 1,000 mg by mouth every 6 (six) hours as needed for moderate pain.   albuterol (2.5 MG/3ML) 0.083% nebulizer solution Commonly known as:  PROVENTIL Take 2.5 mg by nebulization every 6 (six) hours as needed for wheezing or shortness of breath.   PROAIR HFA 108 (90  Base) MCG/ACT inhaler Generic drug:  albuterol Inhale 2 puffs into the lungs every 4 (four) hours as needed for wheezing or shortness of breath.   BEPREVE 1.5 % Soln Generic drug:  Bepotastine Besilate Place 1 drop into both eyes daily as needed.   cetirizine 10 MG tablet Commonly known as:  ZYRTEC Take 10 mg by mouth daily.   CULTURELLE DIGESTIVE HEALTH PO Take 1 capsule by mouth daily.   desonide 0.05 % ointment Commonly known as:  DESOWEN Apply sparingly to affected areas twice daily as needed to the face and/or neck.   dexmethylphenidate 20 MG 24 hr capsule Commonly known as:  FOCALIN XR Take 20 mg by mouth every morning.   docusate sodium 100 MG capsule Commonly known as:  COLACE Take 100 mg by mouth daily as needed for mild constipation.   EPINEPHrine 0.3 mg/0.3 mL Soaj injection Commonly known as:  EPI-PEN Inject 0.3 mg into the muscle daily as needed (allergic reaction).   Fish Oil 1000 MG Caps Take 1,000 mg by mouth 2 (two) times daily.   fluticasone 50 MCG/ACT nasal spray Commonly known as:  FLONASE Place 2 sprays into both nostrils daily.   montelukast 10 MG tablet Commonly known as:  SINGULAIR Take 10 mg by mouth at bedtime.   multivitamin with minerals Tabs tablet Take 1 tablet by mouth daily.   PRESCRIPTION MEDICATION Apply 1 application topically daily as needed.   sertraline 100 MG tablet Commonly known as:  ZOLOFT Take 100 mg by mouth daily.   triamcinolone ointment 0.1 % Commonly known as:  KENALOG Apply sparingly to affected areas twice daily as needed, below the face.   vitamin C 500 MG tablet Commonly known as:  ASCORBIC ACID Take 500 mg by mouth daily.       Known medication allergies: Allergies  Allergen Reactions  . Eggs Or Egg-Derived Products Anaphylaxis  . Peanut Oil Anaphylaxis  . Peanut-Containing Drug Products   . Shellfish Allergy   . Shellfish-Derived Products Rash    I appreciate the opportunity to take part in  Almus's care. Please do not hesitate to contact me with questions.  Sincerely,   R. Jorene Guest, MD

## 2017-03-19 NOTE — Assessment & Plan Note (Addendum)
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   A lab order form has been provided for serum specific IgE against peanut, peanut component, egg, egg component, and shellfish panel.  Careful avoidance of tree nuts and coconut as discussed.  Until peanut, shellfish, and egg allergy have been been definitively ruled out, continue careful avoidance of these foods as well.  A refill prescription has been provided for epinephrine auto-injector 2 pack along with instructions for proper administration.  A food allergy action plan has been provided and discussed.  Medic Alert identification is recommended.

## 2017-03-19 NOTE — Assessment & Plan Note (Signed)
Well-controlled, we will stepdown therapy at this time.  Discontinue montelukast.   If lower respiratory symptoms progress in frequency and/or severity, the patient is to restart montelukast.  Continue albuterol HFA, 1-2 inhalations every 4-6 hours as needed.  Subjective and objective measures of pulmonary function will be followed and the treatment plan will be adjusted accordingly.

## 2017-03-19 NOTE — Assessment & Plan Note (Addendum)
   Laboratory order form has been provided for serum specific IgE against aeroallergen panel.  To avoid diminishing benefit with daily use (tachyphylaxis), consider alternating every few months between fexofenadine (Allegra) and cetirizine (Zyrtec).  A prescription has been provided for fluticasone nasal spray, 2 sprays per nostril daily as needed. Proper nasal spray technique has been discussed and demonstrated.  I have also recommended nasal saline spray (i.e. Simply Saline) as needed and prior to medicated nasal sprays.

## 2017-03-21 ENCOUNTER — Encounter: Payer: Self-pay | Admitting: *Deleted

## 2017-03-22 LAB — IGE+ALLERGENS ZONE 2(30)
Alternaria Alternata IgE: 1.27 kU/L — AB
Amer Sycamore IgE Qn: 0.61 kU/L — AB
Aspergillus Fumigatus IgE: 0.32 kU/L — AB
Bahia Grass IgE: 0.23 kU/L — AB
Bermuda Grass IgE: 0.24 kU/L — AB
Cat Dander IgE: 0.41 kU/L — AB
Cedar, Mountain IgE: 0.33 kU/L — AB
Cladosporium Herbarum IgE: 0.54 kU/L — AB
Cockroach, American IgE: 0.13 kU/L — AB
Common Silver Birch IgE: 0.55 kU/L — AB
D Farinae IgE: 55.9 kU/L — AB
D Pteronyssinus IgE: 41.9 kU/L — AB
Dog Dander IgE: 0.56 kU/L — AB
Elm, American IgE: 0.48 kU/L — AB
Hickory, White IgE: 9.16 kU/L — AB
IgE (Immunoglobulin E), Serum: 6992 IU/mL — ABNORMAL HIGH (ref 0–100)
Johnson Grass IgE: 0.45 kU/L — AB
Maple/Box Elder IgE: 0.46 kU/L — AB
Mucor Racemosus IgE: 0.31 kU/L — AB
Mugwort IgE Qn: 0.16 kU/L — AB
Nettle IgE: 0.46 kU/L — AB
Oak, White IgE: 6.08 kU/L — AB
Penicillium Chrysogen IgE: 0.37 kU/L — AB
Pigweed, Rough IgE: 0.31 kU/L — AB
Plantain, English IgE: 0.27 kU/L — AB
Ragweed, Short IgE: 0.73 kU/L — AB
Sheep Sorrel IgE Qn: 0.3 kU/L — AB
Stemphylium Herbarum IgE: 0.59 kU/L — AB
Sweet gum IgE RAST Ql: 0.88 kU/L — AB
Timothy Grass IgE: 0.23 kU/L — AB
White Mulberry IgE: 0.17 kU/L — AB

## 2017-03-22 LAB — ALLERGEN, PEANUT COMPONENT PANEL
F352-IgE Ara h 8: 0.12 kU/L — AB
F422-IgE Ara h 1: 1.11 kU/L — AB
F423-IgE Ara h 2: 1.11 kU/L — AB
F424-IgE Ara h 3: 0.24 kU/L — AB
F427-IgE Ara h 9: 0.13 kU/L — AB

## 2017-03-22 LAB — ALLERGEN PROFILE, SHELLFISH
Clam IgE: 0.46 kU/L — AB
F023-IgE Crab: <0.1 kU/L
F080-IgE Lobster: 0.16 kU/L — AB
F290-IgE Oyster: 0.17 kU/L — AB
Scallop IgE: 1.95 kU/L — AB
Shrimp IgE: 0.55 kU/L — AB

## 2017-03-22 LAB — ALLERGEN, PEANUT F13: Peanut IgE: 2.53 kU/L — AB

## 2017-03-22 LAB — EGG COMPONENT PANEL
F232-IgE Ovalbumin: 0.32 kU/L — AB
F233-IgE Ovomucoid: 0.11 kU/L — AB

## 2017-03-22 LAB — F245-IGE EGG, WHOLE: Egg, Whole IgE: 0.99 kU/L — AB

## 2017-04-05 ENCOUNTER — Telehealth: Payer: Self-pay

## 2017-04-05 NOTE — Telephone Encounter (Signed)
Had an extensive conversation with mom about Francisco Cisneros's labs,avoidance measures regarding foods especially the nuts. Pt. At the age of 31 years of age pt. Ate crushed cashews in a cookie and about 2 hours later had an anaphylaxis reaction so mom is very concerned bc the facility he is in allows peanut/tree nuts. We also discussed the environmental results and sent copy to the parents home and told mom that I would highlight the molds which the results were low/moderate. Mom stated when she brings Francisco Cisneros in on his next appointment she may want some additional testing done and just reminded the mom to make sure Francisco Cisneros does not have any antihistamines for 3 days.

## 2017-04-24 ENCOUNTER — Emergency Department (HOSPITAL_COMMUNITY)
Admission: EM | Admit: 2017-04-24 | Discharge: 2017-04-25 | Disposition: A | Discharge status: Home / Self Care | Payer: Medicare Other | Attending: Emergency Medicine | Admitting: Emergency Medicine

## 2017-04-24 ENCOUNTER — Emergency Department (HOSPITAL_COMMUNITY): Payer: Medicare Other

## 2017-04-24 ENCOUNTER — Encounter (HOSPITAL_COMMUNITY): Payer: Self-pay | Admitting: Emergency Medicine

## 2017-04-24 DIAGNOSIS — R110 Nausea: Secondary | ICD-10-CM | POA: Diagnosis not present

## 2017-04-24 DIAGNOSIS — F840 Autistic disorder: Secondary | ICD-10-CM | POA: Insufficient documentation

## 2017-04-24 DIAGNOSIS — J4520 Mild intermittent asthma, uncomplicated: Secondary | ICD-10-CM | POA: Insufficient documentation

## 2017-04-24 DIAGNOSIS — F909 Attention-deficit hyperactivity disorder, unspecified type: Secondary | ICD-10-CM | POA: Insufficient documentation

## 2017-04-24 DIAGNOSIS — R4182 Altered mental status, unspecified: Secondary | ICD-10-CM | POA: Insufficient documentation

## 2017-04-24 DIAGNOSIS — I880 Nonspecific mesenteric lymphadenitis: Secondary | ICD-10-CM | POA: Diagnosis not present

## 2017-04-24 DIAGNOSIS — Z79899 Other long term (current) drug therapy: Secondary | ICD-10-CM | POA: Diagnosis not present

## 2017-04-24 DIAGNOSIS — W57XXXA Bitten or stung by nonvenomous insect and other nonvenomous arthropods, initial encounter: Secondary | ICD-10-CM | POA: Diagnosis not present

## 2017-04-24 DIAGNOSIS — R5383 Other fatigue: Secondary | ICD-10-CM

## 2017-04-24 DIAGNOSIS — K760 Fatty (change of) liver, not elsewhere classified: Secondary | ICD-10-CM | POA: Diagnosis not present

## 2017-04-24 LAB — CBC
HCT: 44.5 % (ref 39.0–52.0)
Hemoglobin: 15.7 g/dL (ref 13.0–17.0)
MCH: 29.7 pg (ref 26.0–34.0)
MCHC: 35.3 g/dL (ref 30.0–36.0)
MCV: 84.1 fL (ref 78.0–100.0)
PLATELETS: 172 10*3/uL (ref 150–400)
RBC: 5.29 MIL/uL (ref 4.22–5.81)
RDW: 13.1 % (ref 11.5–15.5)
WBC: 6.8 10*3/uL (ref 4.0–10.5)

## 2017-04-24 LAB — COMPREHENSIVE METABOLIC PANEL
ALK PHOS: 46 U/L (ref 38–126)
ALT: 28 U/L (ref 17–63)
AST: 20 U/L (ref 15–41)
Albumin: 4.2 g/dL (ref 3.5–5.0)
Anion gap: 9 (ref 5–15)
BUN: 20 mg/dL (ref 6–20)
CALCIUM: 9.4 mg/dL (ref 8.9–10.3)
CO2: 27 mmol/L (ref 22–32)
CREATININE: 0.76 mg/dL (ref 0.61–1.24)
Chloride: 104 mmol/L (ref 101–111)
Glucose, Bld: 107 mg/dL — ABNORMAL HIGH (ref 65–99)
Potassium: 3.4 mmol/L — ABNORMAL LOW (ref 3.5–5.1)
Sodium: 140 mmol/L (ref 135–145)
Total Bilirubin: 0.9 mg/dL (ref 0.3–1.2)
Total Protein: 7.3 g/dL (ref 6.5–8.1)

## 2017-04-24 LAB — LIPASE, BLOOD: LIPASE: 17 U/L (ref 11–51)

## 2017-04-24 LAB — CBG MONITORING, ED: GLUCOSE-CAPILLARY: 103 mg/dL — AB (ref 65–99)

## 2017-04-24 MED ORDER — IOPAMIDOL (ISOVUE-300) INJECTION 61%
INTRAVENOUS | Status: AC
  Administered 2017-04-25: 100 mL via INTRAVENOUS
  Filled 2017-04-24: qty 100
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
## 2017-04-24 NOTE — ED Notes (Signed)
Requested urine from patient. 

## 2017-04-24 NOTE — ED Notes (Signed)
Pt made an unsuccessful attempt to provide urine specimen. Will try again.

## 2017-04-24 NOTE — ED Provider Notes (Signed)
[facility]-EMERGENCY DEPT Provider Note   CSN: [id number] Arrival date & time: 04/24/17  1202     History   Chief Complaint Chief Complaint  Patient presents with  . lethargic  . Altered Mental Status    HPI Francisco Cisneros is a 31 y.o. male.  HPI   Since Monday morning has been nauseas, not eating or drinking, sleepy. His eyes appear blood shot and he has been staring more.  Dizzy.  He is normally talkative and happy, however now is very sleepy, not himself. No history of head injury or temperature.  Was home with family for the last 5 days because of the weather--[place]. Sunday night did not sleep well, and Monday was not appearing well prior to returning to farm.  No change in medicine with exception that he was taken off of montelukast after being on it for years. Was on zyrtec for over 10 years and was put on allegra.  No other medication changes, no diet changes.  This year hasn't had confirmed tick bites however [place] farm, and around therapy dog.  He has a difficult time expressing symptoms at baseline however has not reported any pain.   Reports he is walking around house dragging his feet.  No focal numbness or weakness.   Past Medical History:  Diagnosis Date  . ADHD (attention deficit hyperactivity disorder)   . Asthma   . Autistic disorder   . Eczema     Patient Active Problem List   Diagnosis Date Noted  . Food allergy 03/19/2017  . Atopic dermatitis 03/19/2017  . Other allergic rhinitis 03/19/2017  . Mild intermittent asthma 03/19/2017    Past Surgical History:  Procedure Laterality Date  . WISDOM TOOTH EXTRACTION  2009       Home Medications    Prior to Admission medications   Medication Sig Start Date End Date Taking? Authorizing Provider  acetaminophen (TYLENOL) 500 MG tablet Take 1,000 mg by mouth every 6 (six) hours as needed for moderate pain.   Yes [provider]  albuterol (PROAIR HFA) 108 (90 Base)  MCG/ACT inhaler Inhale 2 puffs into the lungs every 4 (four) hours as needed for wheezing or shortness of breath.   Yes [provider]  albuterol (PROVENTIL) (2.5 MG/3ML) 0.083% nebulizer solution Take 2.5 mg by nebulization every 6 (six) hours as needed for wheezing or shortness of breath.   Yes [provider]  desonide (DESOWEN) 0.05 % ointment Apply sparingly to affected areas twice daily as needed to the face and/or neck. 03/19/17  Yes Bobbitt, Heywood Iles, MD  dexmethylphenidate (FOCALIN XR) 20 MG 24 hr capsule Take 20 mg by mouth every morning.   Yes [provider]  dexmethylphenidate (FOCALIN) 10 MG tablet Take 10 mg by mouth daily.   Yes [provider]  docusate sodium (COLACE) 100 MG capsule Take 100 mg by mouth daily as needed for mild constipation.   Yes [provider]  EPINEPHrine 0.3 mg/0.3 mL IJ SOAJ injection Inject 0.3 mg into the muscle daily as needed (allergic reaction).   Yes [provider]  fexofenadine (ALLEGRA) 30 MG tablet Take 30 mg by mouth daily.   Yes [provider]  fluticasone (FLONASE) 50 MCG/ACT nasal spray Place 2 sprays into both nostrils daily. 03/19/17  Yes Bobbitt, Heywood Iles, MD  Lactobacillus-Inulin (CULTURELLE DIGESTIVE HEALTH PO) Take 1 capsule by mouth daily.   Yes [provider]  LORazepam (ATIVAN) 0.5  MG tablet Take 0.5 mg by mouth daily as needed for anxiety.   Yes [provider]  Melatonin 3 MG CAPS Take 3 mg by mouth at bedtime as needed (sleep).   Yes [provider]  Multiple Vitamin (MULTIVITAMIN WITH MINERALS) TABS tablet Take 1 tablet by mouth daily.   Yes [provider]  Omega-3 Fatty Acids (FISH OIL) 645 MG CAPS Take 640 mg by mouth 3 (three) times daily.    Yes [provider]  Polyethyl Glycol-Propyl Glycol (SYSTANE ULTRA OP) Apply 1 drop to eye daily as needed.   Yes [provider]  PRESCRIPTION MEDICATION Apply 1  application topically daily as needed.   Yes [provider]  sertraline (ZOLOFT) 100 MG tablet Take 100 mg by mouth daily.   Yes [provider]  triamcinolone ointment (KENALOG) 0.1 % Apply sparingly to affected areas twice daily as needed, below the face. 03/19/17  Yes Bobbitt, Heywood Iles, MD    Family History Family History  Problem Relation Age of Onset  . Eczema Mother   . Allergic rhinitis Neg Hx   . Angioedema Neg Hx   . Asthma Neg Hx   . Immunodeficiency Neg Hx   . Urticaria Neg Hx     Social History Social History  Substance Use Topics  . Smoking status: Never Smoker  . Smokeless tobacco: Never Used  . Alcohol use No     Allergies   Eggs or egg-derived products; Peanut oil; Peanut-containing drug products; Shellfish allergy; and Shellfish-derived products   Review of Systems Review of Systems  Unable to perform ROS: Mental status change  Constitutional: Positive for appetite change and fatigue. Negative for fever.  Respiratory: Negative for cough and shortness of breath.   Cardiovascular: Negative for chest pain.  Gastrointestinal: Positive for nausea. Negative for abdominal pain, constipation, diarrhea and vomiting.  Skin: Negative for rash and wound.  Neurological: Positive for dizziness (has said this before when he goes to the doctor) and headaches. Negative for facial asymmetry, weakness and numbness.     Physical Exam Updated Vital Signs BP 118/77 (BP Location: Left Arm)   Pulse (!) 54   Temp 97.9 F (36.6 C) (Oral)   Resp 13   Ht 5' 8.5" (1.74 m)   Wt 78 kg (172 lb)   SpO2 100%   BMI 25.77 kg/m   Physical Exam  Constitutional: He appears well-developed and well-nourished. No distress.  HENT:  Head: Normocephalic and atraumatic.  Mouth/Throat: Oropharynx is clear and moist. No oropharyngeal exudate.  Allergic shiners Periorbital eczema Normal TM   Eyes: Pupils are equal, round, and reactive to light. Conjunctivae and  EOM are normal.  Neck: Normal range of motion.  Looking around room no nuchal rigidity  Cardiovascular: Normal rate, regular rhythm, normal heart sounds and intact distal pulses.  Exam reveals no gallop and no friction rub.   No murmur heard. Pulmonary/Chest: Effort normal and breath sounds normal. No respiratory distress. He has no wheezes. He has no rales.  Abdominal: Soft. He exhibits no distension. There is tenderness (RLQ). There is no guarding.  Musculoskeletal: He exhibits no edema.  Lymphadenopathy:    He has no cervical adenopathy.  Neurological: He is alert.  Skin: Skin is warm and dry. He is not diaphoretic.  Nursing note and vitals reviewed.    ED Treatments / Results  Labs (all labs ordered are listed, but only abnormal results are displayed) Labs Reviewed  COMPREHENSIVE METABOLIC PANEL - Abnormal; Notable for  the following:       Result Value   Potassium 3.4 (*)    Glucose, Bld 107 (*)    All other components within normal limits  CBG MONITORING, ED - Abnormal; Notable for the following:    Glucose-Capillary 103 (*)    All other components within normal limits  CBC  LIPASE, BLOOD  URINALYSIS, ROUTINE W REFLEX MICROSCOPIC  RAPID URINE DRUG SCREEN, HOSP PERFORMED  B. BURGDORFI ANTIBODIES    EKG  EKG Interpretation  Date/Time:  Wednesday April 24 2017 21:23:02 EDT Ventricular Rate:  53 PR Interval:    QRS Duration: 120 QT Interval:  416 QTC Calculation: 391 R Axis:   -10 Text Interpretation:  Sinus rhythm Nonspecific intraventricular conduction delay ST elev, probable normal early repol pattern No previous ECGs available Confirmed by Alvira Monday ([id number]) on 04/25/2017 12:24:40 AM       Radiology Ct Head Wo Contrast  Result Date: 04/25/2017 CLINICAL DATA:  Altered mental status EXAM: CT HEAD WITHOUT CONTRAST TECHNIQUE: Contiguous axial images were obtained from the base of the skull through the vertex without intravenous contrast. COMPARISON:   None. FINDINGS: Brain: No mass lesion, intraparenchymal hemorrhage or extra-axial collection. No evidence of acute cortical infarct. The ventricles and extra-axial CSF spaces are mildly enlarged for age. Vascular: No hyperdense vessel or unexpected calcification. Skull: Normal visualized skull base, calvarium and extracranial soft tissues. Sinuses/Orbits: No sinus fluid levels or advanced mucosal thickening. No mastoid effusion. Normal orbits. IMPRESSION: 1. No acute abnormality. 2. Mildly enlarged CSF spaces for age, suggesting mild underlying atrophy. Electronically Signed   By: Deatra Robinson M.D.   On: 04/25/2017 01:56   Ct Abdomen Pelvis W Contrast  Result Date: 04/25/2017 CLINICAL DATA:  Abdominal pain. Increased lethargy and confusion from baseline. History of autistic disorder and ADHD. EXAM: CT ABDOMEN AND PELVIS WITH CONTRAST TECHNIQUE: Multidetector CT imaging of the abdomen and pelvis was performed using the standard protocol following bolus administration of intravenous contrast. CONTRAST:  100 mL Isovue-300 COMPARISON:  None. FINDINGS: Lower chest: The lung bases are clear. Hepatobiliary: Mild diffuse fatty infiltration of the liver. No focal liver lesions. Gallbladder and bile ducts are unremarkable. Pancreas: Unremarkable. No pancreatic ductal dilatation or surrounding inflammatory changes. Spleen: Normal in size without focal abnormality. Adrenals/Urinary Tract: Adrenal glands are unremarkable. Kidneys are normal, without renal calculi, focal lesion, or hydronephrosis. Bladder is unremarkable. Stomach/Bowel: Stomach, small bowel, and colon are not abnormally distended. No wall thickening or inflammatory changes appreciated. Stool diffusely throughout the colon. The appendix is normal. Scattered mesenteric and right lower quadrant lymph nodes without pathologic enlargement may be reactive or inflammatory. Can't exclude mesenteric adenitis. Vascular/Lymphatic: No significant vascular findings are  present. No enlarged abdominal or pelvic lymph nodes. Reproductive: Prostate is unremarkable. Other: Small periumbilical hernia containing fat. No free air or free fluid in the abdomen. Musculoskeletal: No acute or significant osseous findings. IMPRESSION: 1. Normal appendix. 2. Right lower quadrant lymph nodes without pathologic enlargement may indicate reactive nodes or mesenteric adenitis. 3. No evidence of bowel obstruction or inflammation. 4. Mild diffuse fatty infiltration of the liver. Electronically Signed   By: Burman Nieves M.D.   On: 04/25/2017 02:07    Procedures Procedures (including critical care time)  Medications Ordered in ED Medications  iopamidol (ISOVUE-300) 61 % injection 100 mL (100 mLs Intravenous Contrast Given 04/25/17 0151)  haloperidol lactate (HALDOL) injection 3 mg (3 mg Intravenous Given 04/25/17 0057)  sodium chloride 0.9 % bolus 1,000 mL (1,000 mLs Intravenous  New Bag/Given 04/25/17 0210)     Initial Impression / Assessment and Plan / ED Course  I have reviewed the triage vital signs and the nursing notes.  Pertinent labs & imaging results that were available during my care of the patient were reviewed by me and considered in my medical decision making (see chart for details).     31yo male with history of autism, asthma, eczema presents with concern for change in behavior, fatigue, nausea.  CBC without acute abnormalities. CMP without significant changes to suggest etiology of symptoms. Lipase WNL. Urinalysis negative. UDS WNL. CT head shows no acute findings. Given no fever, no leukocytosis, normal head movements, no neck rigidity, symptoms for 3 days, doubt meningitis or RMSF.  Reports possible tick exposure, will send lyme ab and treat empirically if CTabdomen to eval for appy without acute findings. Care signed out as pt receiving IV fluids. If no acute findings on CT, will treat empirically for tick borne illness and consider viral etiology of symptoms, to  receive zofran and doxycycline.   Final Clinical Impressions(s) / ED Diagnoses   Final diagnoses:  Other fatigue  Nausea  Tick bite, initial encounter, suspected  Mesenteric adenitis    New Prescriptions New Prescriptions   No medications on file     Alvira Monday, MD 04/25/17 0430

## 2017-04-25 ENCOUNTER — Emergency Department (HOSPITAL_COMMUNITY): Payer: Medicare Other

## 2017-04-25 ENCOUNTER — Encounter (HOSPITAL_COMMUNITY): Payer: Self-pay

## 2017-04-25 DIAGNOSIS — I880 Nonspecific mesenteric lymphadenitis: Secondary | ICD-10-CM | POA: Diagnosis not present

## 2017-04-25 DIAGNOSIS — K760 Fatty (change of) liver, not elsewhere classified: Secondary | ICD-10-CM | POA: Diagnosis not present

## 2017-04-25 DIAGNOSIS — R4182 Altered mental status, unspecified: Secondary | ICD-10-CM | POA: Diagnosis not present

## 2017-04-25 LAB — URINALYSIS, ROUTINE W REFLEX MICROSCOPIC
BILIRUBIN URINE: NEGATIVE
Glucose, UA: NEGATIVE mg/dL
HGB URINE DIPSTICK: NEGATIVE
Ketones, ur: NEGATIVE mg/dL
Leukocytes, UA: NEGATIVE
Nitrite: NEGATIVE
PH: 5 (ref 5.0–8.0)
Protein, ur: NEGATIVE mg/dL
SPECIFIC GRAVITY, URINE: 1.028 (ref 1.005–1.030)

## 2017-04-25 LAB — RAPID URINE DRUG SCREEN, HOSP PERFORMED
Amphetamines: NOT DETECTED
Barbiturates: NOT DETECTED
Benzodiazepines: NOT DETECTED
COCAINE: NOT DETECTED
Opiates: NOT DETECTED
TETRAHYDROCANNABINOL: NOT DETECTED

## 2017-04-25 MED ORDER — IOPAMIDOL (ISOVUE-300) INJECTION 61%
100.0000 mL | Freq: Once | INTRAVENOUS | Status: AC | PRN
  Administered 2017-04-25: 100 mL via INTRAVENOUS

## 2017-04-25 MED ORDER — DOXYCYCLINE HYCLATE 100 MG PO TABS
100.0000 mg | ORAL_TABLET | Freq: Once | ORAL | Status: AC
  Administered 2017-04-25: 100 mg via ORAL
  Filled 2017-04-25: qty 1

## 2017-04-25 MED ORDER — SODIUM CHLORIDE 0.9 % IV BOLUS (SEPSIS)
1000.0000 mL | Freq: Once | INTRAVENOUS | Status: AC
  Administered 2017-04-25: 1000 mL via INTRAVENOUS

## 2017-04-25 MED ORDER — HALOPERIDOL LACTATE 5 MG/ML IJ SOLN
3.0000 mg | Freq: Once | INTRAMUSCULAR | Status: AC
  Administered 2017-04-25: 3 mg via INTRAVENOUS
  Filled 2017-04-25: qty 1

## 2017-04-25 MED ORDER — DOXYCYCLINE HYCLATE 100 MG PO CAPS: 100.0000 mg | ORAL_CAPSULE | Freq: Two times a day (BID) | ORAL | 0 refills | Status: DC

## 2017-04-25 NOTE — ED Notes (Signed)
Patient transported to CT 

## 2017-04-25 NOTE — ED Provider Notes (Signed)
4:15 AM Patient care assumed from Francisco Monday, MD, at shift change. Patient pending CT scan at change of shift. This has been reviewed which shows mesenteric adenitis without other acute process in the abdomen or pelvis. The patient has been hydrated with IV fluids. Remainder of laboratory workup has been reviewed. This is reassuring.  I have had a long discussion with the family regarding the patient's imaging findings. Have discussed outpatient course of doxycycline as there is high likelihood for tickborne illness. Low suspicion for RMSF given lack of fever. Will cover for Lyme's disease with doxycycline course. I have recommended close outpatient primary care follow-up as well as the use of ibuprofen for any abdominal discomfort. Return precautions discussed and provided. Patient discharged in stable condition; parents with no unaddressed concerns.   Vitals:   04/24/17 2126 04/24/17 2341 04/25/17 0059 04/25/17 0210  BP: 124/80 118/80  118/77  Pulse: (!) 53 (!) 58  (!) 54  Resp: Temp: 97.7 F (36.5 C)   97.9 F (36.6 C)  TempSrc: Oral   Oral  SpO2: 98% 97%  100%  Weight:   78 kg (172 lb)   Height:   5' 8.5" (1.74 m)    Results for orders placed or performed during the hospital encounter of 04/24/17  Comprehensive metabolic panel  Result Value Ref Range   Sodium 140 135 - 145 mmol/L   Potassium 3.4 (L) 3.5 - 5.1 mmol/L   Chloride 104 101 - 111 mmol/L   CO2 27 22 - 32 mmol/L   Glucose, Bld 107 (H) 65 - 99 mg/dL   BUN 20 6 - 20 mg/dL   Creatinine, Ser 1.61 0.61 - 1.24 mg/dL   Calcium 9.4 8.9 - 09.6 mg/dL   Total Protein 7.3 6.5 - 8.1 g/dL   Albumin 4.2 3.5 - 5.0 g/dL   AST 20 15 - 41 U/L   ALT 28 17 - 63 U/L   Alkaline Phosphatase 46 38 - 126 U/L   Total Bilirubin 0.9 0.3 - 1.2 mg/dL   GFR calc non Af Amer >60 >60 mL/min   GFR calc Af Amer >60 >60 mL/min   Anion gap 9 5 - 15  CBC  Result Value Ref Range   WBC 6.8 4.0 - 10.5 K/uL   RBC 5.29 4.22 - 5.81 MIL/uL    Hemoglobin 15.7 13.0 - 17.0 g/dL   HCT 04.5 40.9 - 81.1 %   MCV 84.1 78.0 - 100.0 fL   MCH 29.7 26.0 - 34.0 pg   MCHC 35.3 30.0 - 36.0 g/dL   RDW 91.4 78.2 - 95.6 %   Platelets 172 150 - 400 K/uL  Lipase, blood  Result Value Ref Range   Lipase 17 11 - 51 U/L  Urinalysis, Routine w reflex microscopic  Result Value Ref Range   Color, Urine YELLOW YELLOW   APPearance CLEAR CLEAR   Specific Gravity, Urine 1.028 1.005 - 1.030   pH 5.0 5.0 - 8.0   Glucose, UA NEGATIVE NEGATIVE mg/dL   Hgb urine dipstick NEGATIVE NEGATIVE   Bilirubin Urine NEGATIVE NEGATIVE   Ketones, ur NEGATIVE NEGATIVE mg/dL   Protein, ur NEGATIVE NEGATIVE mg/dL   Nitrite NEGATIVE NEGATIVE   Leukocytes, UA NEGATIVE NEGATIVE  Rapid urine drug screen (hospital performed)  Result Value Ref Range   Opiates NONE DETECTED NONE DETECTED   Cocaine NONE DETECTED NONE DETECTED   Benzodiazepines NONE DETECTED NONE DETECTED   Amphetamines NONE DETECTED NONE DETECTED  Tetrahydrocannabinol NONE DETECTED NONE DETECTED   Barbiturates NONE DETECTED NONE DETECTED  CBG monitoring, ED  Result Value Ref Range   Glucose-Capillary 103 (H) 65 - 99 mg/dL   Ct Head Wo Contrast  Result Date: 04/25/2017 CLINICAL DATA:  Altered mental status EXAM: CT HEAD WITHOUT CONTRAST TECHNIQUE: Contiguous axial images were obtained from the base of the skull through the vertex without intravenous contrast. COMPARISON:  None. FINDINGS: Brain: No mass lesion, intraparenchymal hemorrhage or extra-axial collection. No evidence of acute cortical infarct. The ventricles and extra-axial CSF spaces are mildly enlarged for age. Vascular: No hyperdense vessel or unexpected calcification. Skull: Normal visualized skull base, calvarium and extracranial soft tissues. Sinuses/Orbits: No sinus fluid levels or advanced mucosal thickening. No mastoid effusion. Normal orbits. IMPRESSION: 1. No acute abnormality. 2. Mildly enlarged CSF spaces for age, suggesting mild  underlying atrophy. Electronically Signed   By: Deatra Robinson M.D.   On: 04/25/2017 01:56   Ct Abdomen Pelvis W Contrast  Result Date: 04/25/2017 CLINICAL DATA:  Abdominal pain. Increased lethargy and confusion from baseline. History of autistic disorder and ADHD. EXAM: CT ABDOMEN AND PELVIS WITH CONTRAST TECHNIQUE: Multidetector CT imaging of the abdomen and pelvis was performed using the standard protocol following bolus administration of intravenous contrast. CONTRAST:  100 mL Isovue-300 COMPARISON:  None. FINDINGS: Lower chest: The lung bases are clear. Hepatobiliary: Mild diffuse fatty infiltration of the liver. No focal liver lesions. Gallbladder and bile ducts are unremarkable. Pancreas: Unremarkable. No pancreatic ductal dilatation or surrounding inflammatory changes. Spleen: Normal in size without focal abnormality. Adrenals/Urinary Tract: Adrenal glands are unremarkable. Kidneys are normal, without renal calculi, focal lesion, or hydronephrosis. Bladder is unremarkable. Stomach/Bowel: Stomach, small bowel, and colon are not abnormally distended. No wall thickening or inflammatory changes appreciated. Stool diffusely throughout the colon. The appendix is normal. Scattered mesenteric and right lower quadrant lymph nodes without pathologic enlargement may be reactive or inflammatory. Can't exclude mesenteric adenitis. Vascular/Lymphatic: No significant vascular findings are present. No enlarged abdominal or pelvic lymph nodes. Reproductive: Prostate is unremarkable. Other: Small periumbilical hernia containing fat. No free air or free fluid in the abdomen. Musculoskeletal: No acute or significant osseous findings. IMPRESSION: 1. Normal appendix. 2. Right lower quadrant lymph nodes without pathologic enlargement may indicate reactive nodes or mesenteric adenitis. 3. No evidence of bowel obstruction or inflammation. 4. Mild diffuse fatty infiltration of the liver. Electronically Signed   By: Burman Nieves M.D.   On: 04/25/2017 02:07      Antony Madura, PA-C 04/25/17 4540

## 2017-04-26 LAB — B. BURGDORFI ANTIBODIES

## 2017-04-29 DIAGNOSIS — G4700 Insomnia, unspecified: Secondary | ICD-10-CM | POA: Diagnosis not present

## 2017-04-29 DIAGNOSIS — F419 Anxiety disorder, unspecified: Secondary | ICD-10-CM | POA: Diagnosis not present

## 2017-04-29 DIAGNOSIS — R5383 Other fatigue: Secondary | ICD-10-CM | POA: Diagnosis not present

## 2017-04-29 DIAGNOSIS — F439 Reaction to severe stress, unspecified: Secondary | ICD-10-CM | POA: Diagnosis not present

## 2017-04-29 DIAGNOSIS — D751 Secondary polycythemia: Secondary | ICD-10-CM | POA: Diagnosis not present

## 2017-05-06 DIAGNOSIS — Z0000 Encounter for general adult medical examination without abnormal findings: Secondary | ICD-10-CM | POA: Diagnosis not present

## 2017-05-06 DIAGNOSIS — E785 Hyperlipidemia, unspecified: Secondary | ICD-10-CM | POA: Diagnosis not present

## 2017-05-06 DIAGNOSIS — Z23 Encounter for immunization: Secondary | ICD-10-CM | POA: Diagnosis not present

## 2017-05-06 DIAGNOSIS — K5900 Constipation, unspecified: Secondary | ICD-10-CM | POA: Diagnosis not present

## 2017-05-06 DIAGNOSIS — D751 Secondary polycythemia: Secondary | ICD-10-CM | POA: Diagnosis not present

## 2017-05-27 DIAGNOSIS — F419 Anxiety disorder, unspecified: Secondary | ICD-10-CM | POA: Diagnosis not present

## 2017-06-24 DIAGNOSIS — H5213 Myopia, bilateral: Secondary | ICD-10-CM | POA: Diagnosis not present

## 2017-06-24 DIAGNOSIS — H10413 Chronic giant papillary conjunctivitis, bilateral: Secondary | ICD-10-CM | POA: Diagnosis not present

## 2017-07-23 ENCOUNTER — Ambulatory Visit: Payer: Self-pay | Admitting: Allergy and Immunology

## 2017-09-18 DIAGNOSIS — J309 Allergic rhinitis, unspecified: Secondary | ICD-10-CM | POA: Diagnosis not present

## 2017-09-18 DIAGNOSIS — Z91010 Allergy to peanuts: Secondary | ICD-10-CM | POA: Diagnosis not present

## 2017-09-24 ENCOUNTER — Encounter: Payer: Self-pay | Admitting: Allergy and Immunology

## 2017-09-24 ENCOUNTER — Ambulatory Visit (INDEPENDENT_AMBULATORY_CARE_PROVIDER_SITE_OTHER): Payer: Medicare Other | Admitting: Allergy and Immunology

## 2017-09-24 ENCOUNTER — Ambulatory Visit: Payer: Self-pay | Admitting: Allergy and Immunology

## 2017-09-24 DIAGNOSIS — J4521 Mild intermittent asthma with (acute) exacerbation: Secondary | ICD-10-CM

## 2017-09-24 DIAGNOSIS — T7800XD Anaphylactic reaction due to unspecified food, subsequent encounter: Secondary | ICD-10-CM | POA: Diagnosis not present

## 2017-09-24 DIAGNOSIS — L2089 Other atopic dermatitis: Secondary | ICD-10-CM

## 2017-09-24 DIAGNOSIS — J3089 Other allergic rhinitis: Secondary | ICD-10-CM | POA: Diagnosis not present

## 2017-09-24 MED ORDER — FLUTICASONE PROPIONATE 50 MCG/ACT NA SUSP: 2.0000 | Freq: Every day | NASAL | 5 refills | Status: DC

## 2017-09-24 MED ORDER — FLUTICASONE PROPIONATE HFA 110 MCG/ACT IN AERO
2.0000 | INHALATION_SPRAY | Freq: Two times a day (BID) | RESPIRATORY_TRACT | 5 refills | Status: DC
Start: 2017-09-24 — End: 2017-10-15

## 2017-09-24 NOTE — Progress Notes (Signed)
Follow-up Note  RE: Francisco Cisneros MRN: [id number] DOB: 04-14-1986 Date of Office Visit: 09/24/2017  Primary care provider: Farris Has, MD Referring provider: Farris Has, MD  History of present illness: Francisco Cisneros is a 32 y.o. male with allergic rhinitis, atopic dermatitis, food allergy, and intermittent asthma presenting today for a sick visit.  He was previously seen in this clinic for his initial evaluation on March 19, 2017.  He is accompanied today by his mother who provides the history.  Over the past week, he has been experiencing nasal congestion, rhinorrhea, and coughing.  The coughing has become frequent and severe.  Just mother believes that the respiratory symptoms have been triggered by a virus because several days after Francisco Cisneros developed the nasal symptoms, his father developed similar symptoms.  He had tried switching from cetirizine to Allegra, however he began "acting not himself" requiring evaluation and imaging of the head in the emergency department.  No abnormalities were found.  His mother was concerned that the mental status change was due to abrupt discontinuation of montelukast, or starting the fexofenadine.  She has decided not to restart the fexofenadine.  His eczema has been relatively stable.  He avoids peanuts, egg, and shellfish and his caregivers have access to epinephrine autoinjectors.   Assessment and plan: Mild intermittent asthma Mild exacerbation, likely secondary to viral syndrome.  For now, and during respiratory tract infections or asthma flares, add Flovent 110g 2 inhalations 2 times per day until symptoms have returned to baseline.  To maximize pulmonary deposition, a spacer has been provided along with instructions for its proper administration with an HFA inhaler.  Continue albuterol HFA, 1-2 inhalations every 4-6 hours as needed.  The patient's mother has been asked to contact me if his symptoms persist or progress. Otherwise, he may  return for follow up in 6 months.  Other allergic rhinitis  Continue appropriate allergen avoidance measures, cetirizine 10 mg daily as needed, fluticasone nasal spray daily as needed, and nasal saline irrigation if needed.  Atopic dermatitis  Continue appropriate skin care measures, desonide twice daily to the face/neck if needed, and triamcinolone 0.1% ointment sparingly to affected areas on the body below the neck twice daily if needed.  Food allergy Lab work revealed serum specific IgE elevation against peanut, egg, and shellfish.  Continue meticulous avoidance of nuts, shellfish, and egg and have access to epinephrine autoinjector 2 pack in case of accidental ingestion.  Food allergy action plan is in place.   Meds ordered this encounter  Medications  . fluticasone (FLOVENT HFA) 110 MCG/ACT inhaler    Sig: Inhale 2 puffs into the lungs 2 (two) times daily.    Dispense:  1 Inhaler    Refill:  5  . fluticasone (FLONASE) 50 MCG/ACT nasal spray    Sig: Place 2 sprays into both nostrils daily.    Dispense:  16 g    Refill:  5    Diagnostics: Pre-bronchodilator spirometry was uninterpretable because he was unable to perform the study without coughing.  Postbronchodilator spirometry revealed an FVC of 3.48 L and an FEV1 of 3.05 L (72% predicted).  Please see scanned spirometry results for details.    Physical examination: There were no vitals taken for this visit.  General: Alert, interactive, in no acute distress. HEENT: TMs pearly gray, turbinates moderately edematous with thick discharge, post-pharynx moderately erythematous. Neck: Supple without lymphadenopathy. Lungs: Mildly decreased breath sounds with expiratory wheezing bilaterally. CV: Normal S1, S2 without murmurs. Skin: Warm and  dry, without lesions or rashes.  The following portions of the patient's history were reviewed and updated as appropriate: allergies, current medications, past family history, past medical  history, past social history, past surgical history and problem list.  Allergies as of 09/24/2017      Reactions   Eggs Or Egg-derived Products Anaphylaxis   Peanut Oil Anaphylaxis   Coconut Oil    Other    Allergic to ALL NUTS   Peanut-containing Drug Products    Shellfish Allergy       Medication List        Accurate as of 09/24/17  5:26 PM. Always use your most recent med list.          acetaminophen 500 MG tablet Commonly known as:  TYLENOL Take 1,000 mg by mouth every 6 (six) hours as needed for moderate pain.   albuterol (2.5 MG/3ML) 0.083% nebulizer solution Commonly known as:  PROVENTIL Take 2.5 mg by nebulization every 6 (six) hours as needed for wheezing or shortness of breath.   PROAIR HFA 108 (90 Base) MCG/ACT inhaler Generic drug:  albuterol Inhale 2 puffs into the lungs every 4 (four) hours as needed for wheezing or shortness of breath.   cetirizine 10 MG tablet Commonly known as:  ZYRTEC Take 10 mg by mouth daily.   CULTURELLE DIGESTIVE HEALTH PO Take 1 capsule by mouth daily.   desonide 0.05 % ointment Commonly known as:  DESOWEN Apply sparingly to affected areas twice daily as needed to the face and/or neck.   dexmethylphenidate 10 MG tablet Commonly known as:  FOCALIN Take 10 mg by mouth daily.   dexmethylphenidate 20 MG 24 hr capsule Commonly known as:  FOCALIN XR Take 20 mg by mouth every morning.   docusate sodium 100 MG capsule Commonly known as:  COLACE Take 100 mg by mouth daily as needed for mild constipation.   EPINEPHrine 0.3 mg/0.3 mL Soaj injection Commonly known as:  EPI-PEN Inject 0.3 mg into the muscle daily as needed (allergic reaction).   fexofenadine 30 MG tablet Commonly known as:  ALLEGRA Take 30 mg by mouth daily.   Fish Oil 645 MG Caps Take 640 mg by mouth 3 (three) times daily.   fluticasone 110 MCG/ACT inhaler Commonly known as:  FLOVENT HFA Inhale 2 puffs into the lungs 2 (two) times daily.   fluticasone 50  MCG/ACT nasal spray Commonly known as:  FLONASE Place 2 sprays into both nostrils daily.   LORazepam 0.5 MG tablet Commonly known as:  ATIVAN Take 0.5 mg by mouth daily as needed for anxiety.   Melatonin 3 MG Caps Take 3 mg by mouth at bedtime as needed (sleep).   multivitamin with minerals Tabs tablet Take 1 tablet by mouth daily.   PRESCRIPTION MEDICATION Apply 1 application topically daily as needed.   sertraline 100 MG tablet Commonly known as:  ZOLOFT Take 100 mg by mouth daily.   SYSTANE ULTRA OP Apply 1 drop to eye daily as needed.   triamcinolone ointment 0.1 % Commonly known as:  KENALOG Apply sparingly to affected areas twice daily as needed, below the face.       Allergies  Allergen Reactions  . Eggs Or Egg-Derived Products Anaphylaxis  . Peanut Oil Anaphylaxis  . Coconut Oil   . Other     Allergic to ALL NUTS  . Peanut-Containing Drug Products   . Shellfish Allergy    Review of systems: Review of systems negative except as noted in HPI /  PMHx or noted below: Constitutional: Negative.  HENT: Negative.   Eyes: Negative.  Respiratory: Negative.   Cardiovascular: Negative.  Gastrointestinal: Negative.  Genitourinary: Negative.  Musculoskeletal: Negative.  Neurological: Negative.  Endo/Heme/Allergies: Negative.  Cutaneous: Negative.  Past Medical History:  Diagnosis Date  . ADHD (attention deficit hyperactivity disorder)   . Asthma   . Autistic disorder   . Eczema     Family History  Problem Relation Age of Onset  . Eczema Mother   . Allergic rhinitis Neg Hx   . Angioedema Neg Hx   . Asthma Neg Hx   . Immunodeficiency Neg Hx   . Urticaria Neg Hx     Social History   Socioeconomic History  . Marital status: Single    Spouse name: Not on file  . Number of children: Not on file  . Years of education: Not on file  . Highest education level: Not on file  Social Needs  . Financial resource strain: Not on file  . Food insecurity -  worry: Not on file  . Food insecurity - inability: Not on file  . Transportation needs - medical: Not on file  . Transportation needs - non-medical: Not on file  Occupational History  . Not on file  Tobacco Use  . Smoking status: Never Smoker  . Smokeless tobacco: Never Used  Substance and Sexual Activity  . Alcohol use: No  . Drug use: No  . Sexual activity: Not on file  Other Topics Concern  . Not on file  Social History Narrative  . Not on file    I appreciate the opportunity to take part in Francisco Cisneros's care. Please do not hesitate to contact me with questions.  Sincerely,   R. Jorene Guest, MD

## 2017-09-24 NOTE — Assessment & Plan Note (Addendum)
Lab work revealed serum specific IgE elevation against peanut, egg, and shellfish.  Continue meticulous avoidance of nuts, shellfish, and egg and have access to epinephrine autoinjector 2 pack in case of accidental ingestion.  Food allergy action plan is in place.

## 2017-09-24 NOTE — Assessment & Plan Note (Signed)
   Continue appropriate skin care measures, desonide twice daily to the face/neck if needed, and triamcinolone 0.1% ointment sparingly to affected areas on the body below the neck twice daily if needed. 

## 2017-09-24 NOTE — Assessment & Plan Note (Signed)
Mild exacerbation, likely secondary to viral syndrome.  For now, and during respiratory tract infections or asthma flares, add Flovent 110g 2 inhalations 2 times per day until symptoms have returned to baseline.  To maximize pulmonary deposition, a spacer has been provided along with instructions for its proper administration with an HFA inhaler.  Continue albuterol HFA, 1-2 inhalations every 4-6 hours as needed.  The patient's mother has been asked to contact me if his symptoms persist or progress. Otherwise, he may return for follow up in 6 months.

## 2017-09-24 NOTE — Patient Instructions (Addendum)
Mild intermittent asthma Mild exacerbation, likely secondary to viral syndrome.  For now, and during respiratory tract infections or asthma flares, add Flovent 110g 2 inhalations 2 times per day until symptoms have returned to baseline.  To maximize pulmonary deposition, a spacer has been provided along with instructions for its proper administration with an HFA inhaler.  Continue albuterol HFA, 1-2 inhalations every 4-6 hours as needed.  The patient's mother has been asked to contact me if his symptoms persist or progress. Otherwise, he may return for follow up in 6 months.  Other allergic rhinitis  Continue appropriate allergen avoidance measures, cetirizine 10 mg daily as needed, fluticasone nasal spray daily as needed, and nasal saline irrigation if needed.  Atopic dermatitis  Continue appropriate skin care measures, desonide twice daily to the face/neck if needed, and triamcinolone 0.1% ointment sparingly to affected areas on the body below the neck twice daily if needed.  Food allergy Lab work revealed serum specific IgE elevation against peanut, egg, and shellfish.  Continue meticulous avoidance of nuts, shellfish, and egg and have access to epinephrine autoinjector 2 pack in case of accidental ingestion.  Food allergy action plan is in place.   Return in about 6 months (around 03/24/2018), or if symptoms worsen or fail to improve.

## 2017-09-24 NOTE — Assessment & Plan Note (Signed)
   Continue appropriate allergen avoidance measures, cetirizine 10 mg daily as needed, fluticasone nasal spray daily as needed, and nasal saline irrigation if needed.

## 2017-09-25 DIAGNOSIS — J45901 Unspecified asthma with (acute) exacerbation: Secondary | ICD-10-CM | POA: Diagnosis not present

## 2017-09-25 DIAGNOSIS — L309 Dermatitis, unspecified: Secondary | ICD-10-CM | POA: Diagnosis not present

## 2017-10-09 DIAGNOSIS — L309 Dermatitis, unspecified: Secondary | ICD-10-CM | POA: Diagnosis not present

## 2017-10-09 DIAGNOSIS — L218 Other seborrheic dermatitis: Secondary | ICD-10-CM | POA: Diagnosis not present

## 2017-10-10 ENCOUNTER — Telehealth: Payer: Self-pay | Admitting: Allergy and Immunology

## 2017-10-10 NOTE — Telephone Encounter (Signed)
Left message for parent to call office

## 2017-10-10 NOTE — Telephone Encounter (Signed)
Mom called and said Francisco Cisneros was seen 09-24-17 for wheezing and cough. He was prescribed Flovent and zyrtec. He has stopped Flovent and mom doesn't hear anymore wheezing, but he still has a cough. He is still taking Zyrtec. She would like to talk to a nurse to see if this is normal. She also made an appointment on 10-15-17, just in case he needed to be seen.

## 2017-10-14 NOTE — Telephone Encounter (Signed)
Called mom and she mentioned that he is still coughing and does have some thrush, she only gave him Flovent for a week. She stated he has an appt tomorrow. I told her to lets just follow up tomorrow and go from there.

## 2017-10-15 ENCOUNTER — Ambulatory Visit (INDEPENDENT_AMBULATORY_CARE_PROVIDER_SITE_OTHER): Payer: Medicare Other | Admitting: Allergy and Immunology

## 2017-10-15 ENCOUNTER — Encounter: Payer: Self-pay | Admitting: Allergy and Immunology

## 2017-10-15 VITALS — BP 128/74 | HR 92 | Resp 20

## 2017-10-15 DIAGNOSIS — T7800XD Anaphylactic reaction due to unspecified food, subsequent encounter: Secondary | ICD-10-CM | POA: Diagnosis not present

## 2017-10-15 DIAGNOSIS — J4530 Mild persistent asthma, uncomplicated: Secondary | ICD-10-CM | POA: Diagnosis not present

## 2017-10-15 DIAGNOSIS — R05 Cough: Secondary | ICD-10-CM

## 2017-10-15 DIAGNOSIS — J3089 Other allergic rhinitis: Secondary | ICD-10-CM | POA: Diagnosis not present

## 2017-10-15 DIAGNOSIS — L2089 Other atopic dermatitis: Secondary | ICD-10-CM | POA: Diagnosis not present

## 2017-10-15 DIAGNOSIS — J4521 Mild intermittent asthma with (acute) exacerbation: Secondary | ICD-10-CM

## 2017-10-15 DIAGNOSIS — R053 Chronic cough: Secondary | ICD-10-CM | POA: Insufficient documentation

## 2017-10-15 MED ORDER — GUAIFENESIN 400 MG PO TABS: 400.0000 mg | ORAL_TABLET | ORAL | 3 refills | Status: DC

## 2017-10-15 MED ORDER — FLUTICASONE PROPIONATE HFA 110 MCG/ACT IN AERO: 2.0000 | INHALATION_SPRAY | Freq: Two times a day (BID) | RESPIRATORY_TRACT | 5 refills | Status: DC

## 2017-10-15 NOTE — Patient Instructions (Addendum)
Mild persistent asthma Currently with suboptimal control.  Add Flovent 110 g, 2 inhalations via spacer device twice daily.  Continue albuterol HFA, 1-2 inhalations every 4-6 hours as needed.  Subjective and objective measures of pulmonary function will be followed and the treatment plan will be adjusted accordingly.  Other allergic rhinitis  Continue appropriate allergen avoidance measures.  A prescription has been provided for guaifenesin 400 mg every 4-6 hours if needed with adequate hydration.   I recommend using the fluticasone nasal spray at least 2 sprays per nostril once daily.  Nasal saline spray (i.e. Simply Saline) is recommended prior to medicated nasal sprays and as needed.  Cough, persistent The most common causes of chronic cough include the following: upper airway cough syndrome (UACS) which is caused by variety of rhinosinus conditions; asthma; gastroesophageal reflux disease (GERD); chronic bronchitis from cigarette smoking or other inhaled environmental irritants; non-asthmatic eosinophilic bronchitis; and bronchiectasis. In prospective studies, these conditions have accounted for up to 94% of the causes of chronic cough in immunocompetent adults. The history and physical examination suggest that his cough is multifactorial with contribution from postnasal drainage and bronchial hyperresponsiveness. We will address these issues at this time.   Treatment plan as outlined above.    If the cough persists or progresses, we will refer to otolaryngology for further evaluation and treatment.  Atopic dermatitis  Continue appropriate skin care measures, desonide twice daily to the face/neck if needed, and triamcinolone 0.1% ointment sparingly to affected areas on the body below the neck twice daily if needed.  Food allergy  Continue careful avoidance of nuts, shellfish, and egg and have access to epinephrine autoinjector 2 pack in case of accidental ingestion.  Food  allergy action plan is in place.   Return in 2 months or sooner if needed.

## 2017-10-15 NOTE — Assessment & Plan Note (Signed)
Currently with suboptimal control.  Add Flovent 110 g, 2 inhalations via spacer device twice daily.  Continue albuterol HFA, 1-2 inhalations every 4-6 hours as needed.  Subjective and objective measures of pulmonary function will be followed and the treatment plan will be adjusted accordingly.

## 2017-10-15 NOTE — Assessment & Plan Note (Signed)
The most common causes of chronic cough include the following: upper airway cough syndrome (UACS) which is caused by variety of rhinosinus conditions; asthma; gastroesophageal reflux disease (GERD); chronic bronchitis from cigarette smoking or other inhaled environmental irritants; non-asthmatic eosinophilic bronchitis; and bronchiectasis. In prospective studies, these conditions have accounted for up to 94% of the causes of chronic cough in immunocompetent adults. The history and physical examination suggest that his cough is multifactorial with contribution from postnasal drainage and bronchial hyperresponsiveness. We will address these issues at this time.   Treatment plan as outlined above.    If the cough persists or progresses, we will refer to otolaryngology for further evaluation and treatment.

## 2017-10-15 NOTE — Assessment & Plan Note (Addendum)
   Continue appropriate allergen avoidance measures.  A prescription has been provided for guaifenesin 400 mg every 4-6 hours if needed with adequate hydration.   I recommend using the fluticasone nasal spray at least 2 sprays per nostril once daily.  Nasal saline spray (i.e. Simply Saline) is recommended prior to medicated nasal sprays and as needed.

## 2017-10-15 NOTE — Progress Notes (Signed)
Follow-up Note  RE: Francisco Cisneros MRN: [id number] DOB: March 29, 1986 Date of Office Visit: 10/15/2017  Primary care provider: Farris Has, MD Referring provider: Farris Has, MD  History of present illness: Francisco Cisneros is a 32 y.o. male with allergic rhinitis, atopic dermatitis, food allergy, and intermittent asthma presenting today for a sick visit.  He was last seen in this clinic on September 24, 2017.  He is accompanied today by his parents who provide the history.  He is still experiencing a frequent/severe cough.  His mother reports that his coughing improved somewhat while on Flovent, however she discontinued this medication after 7 days because she was worried about thrush.  He had not shown signs or symptoms of thrush, however she had read the package insert and became concerned about this potential issue.  It should be noted that the cough did persist, just to a lesser degree, while on the Flovent.  Montelukast had been discontinued in October because of his history of anxiety and depression.  Francisco Cisneros does not know if he Cisneros ever experienced heartburn.  His mother is not interested in the prospect of attempting a antacid therapeutic trial.  He Cisneros been experiencing some nasal congestion and postnasal drainage.  He Cisneros many questions today regarding allergen avoidance measures at the group home where he lives.   Assessment and plan: Mild persistent asthma Currently with suboptimal control.  Add Flovent 110 g, 2 inhalations via spacer device twice daily.  Continue albuterol HFA, 1-2 inhalations every 4-6 hours as needed.  Subjective and objective measures of pulmonary function will be followed and the treatment plan will be adjusted accordingly.  Other allergic rhinitis  Continue appropriate allergen avoidance measures.  A prescription Cisneros been provided for guaifenesin 400 mg every 4-6 hours if needed with adequate hydration.   I recommend using the fluticasone nasal  spray at least 2 sprays per nostril once daily.  Nasal saline spray (i.e. Simply Saline) is recommended prior to medicated nasal sprays and as needed.  Cough, persistent The most common causes of chronic cough include the following: upper airway cough syndrome (UACS) which is caused by variety of rhinosinus conditions; asthma; gastroesophageal reflux disease (GERD); chronic bronchitis from cigarette smoking or other inhaled environmental irritants; non-asthmatic eosinophilic bronchitis; and bronchiectasis. In prospective studies, these conditions have accounted for up to 94% of the causes of chronic cough in immunocompetent adults. The history and physical examination suggest that his cough is multifactorial with contribution from postnasal drainage and bronchial hyperresponsiveness. We will address these issues at this time.   Treatment plan as outlined above.    If the cough persists or progresses, we will refer to otolaryngology for further evaluation and treatment.  Atopic dermatitis  Continue appropriate skin care measures, desonide twice daily to the face/neck if needed, and triamcinolone 0.1% ointment sparingly to affected areas on the body below the neck twice daily if needed.  Food allergy  Continue careful avoidance of nuts, shellfish, and egg and have access to epinephrine autoinjector 2 pack in case of accidental ingestion.  Food allergy action plan is in place.   Meds ordered this encounter  Medications  . guaifenesin (HUMIBID E) 400 MG TABS tablet    Sig: Take 1 tablet (400 mg total) by mouth every 4 (four) hours.    Dispense:  84 tablet    Refill:  3  . fluticasone (FLOVENT HFA) 110 MCG/ACT inhaler    Sig: Inhale 2 puffs into the lungs 2 (two) times daily.  Dispense:  1 Inhaler    Refill:  5    Diagnostics: Spirometry reveals an FVC of 3.12 L and an FEV1 of 2.79 L (71% predicted) with 80 mL (3%) post bronchodilator improvement.  Please see scanned spirometry  results for details.    Physical examination: Blood pressure 128/74, pulse 92, resp. rate 20.  General: Alert, interactive, in no acute distress. HEENT: TMs pearly gray, turbinates moderately edematous with thick discharge, post-pharynx erythematous. Neck: Supple without lymphadenopathy. Lungs: Clear to auscultation without wheezing, rhonchi or rales. CV: Normal S1, S2 without murmurs. Skin: Warm and dry, without lesions or rashes.  The following portions of the patient's history were reviewed and updated as appropriate: allergies, current medications, past family history, past medical history, past social history, past surgical history and problem list.  Allergies as of 10/15/2017      Reactions   Eggs Or Egg-derived Products Anaphylaxis   Peanut Oil Anaphylaxis   Coconut Oil    Other    Allergic to ALL NUTS   Peanut-containing Drug Products    Shellfish Allergy       Medication List        Accurate as of 10/15/17  5:42 PM. Always use your most recent med list.          acetaminophen 500 MG tablet Commonly known as:  TYLENOL Take 1,000 mg by mouth every 6 (six) hours as needed for moderate pain.   albuterol (2.5 MG/3ML) 0.083% nebulizer solution Commonly known as:  PROVENTIL Take 2.5 mg by nebulization every 6 (six) hours as needed for wheezing or shortness of breath.   PROAIR HFA 108 (90 Base) MCG/ACT inhaler Generic drug:  albuterol Inhale 2 puffs into the lungs every 4 (four) hours as needed for wheezing or shortness of breath.   cetirizine 10 MG tablet Commonly known as:  ZYRTEC Take 10 mg by mouth daily.   CULTURELLE DIGESTIVE HEALTH PO Take 1 capsule by mouth daily.   desonide 0.05 % ointment Commonly known as:  DESOWEN Apply sparingly to affected areas twice daily as needed to the face and/or neck.   dexmethylphenidate 10 MG tablet Commonly known as:  FOCALIN Take 10 mg by mouth daily.   dexmethylphenidate 20 MG 24 hr capsule Commonly known as:   FOCALIN XR Take 20 mg by mouth every morning.   docusate sodium 100 MG capsule Commonly known as:  COLACE Take 100 mg by mouth daily as needed for mild constipation.   EPINEPHrine 0.3 mg/0.3 mL Soaj injection Commonly known as:  EPI-PEN Inject 0.3 mg into the muscle daily as needed (allergic reaction).   Fish Oil 645 MG Caps Take 640 mg by mouth 3 (three) times daily.   fluticasone 110 MCG/ACT inhaler Commonly known as:  FLOVENT HFA Inhale 2 puffs into the lungs 2 (two) times daily.   fluticasone 50 MCG/ACT nasal spray Commonly known as:  FLONASE Place 2 sprays into both nostrils daily.   guaifenesin 400 MG Tabs tablet Commonly known as:  HUMIBID E Take 1 tablet (400 mg total) by mouth every 4 (four) hours.   LORazepam 0.5 MG tablet Commonly known as:  ATIVAN Take 0.5 mg by mouth daily as needed for anxiety.   Melatonin 3 MG Caps Take 3 mg by mouth at bedtime as needed (sleep).   PRESCRIPTION MEDICATION Apply 1 application topically daily as needed.   sertraline 100 MG tablet Commonly known as:  ZOLOFT Take 100 mg by mouth daily.   SYSTANE ULTRA OP Apply  1 drop to eye daily as needed.   triamcinolone ointment 0.1 % Commonly known as:  KENALOG Apply sparingly to affected areas twice daily as needed, below the face.       Allergies  Allergen Reactions  . Eggs Or Egg-Derived Products Anaphylaxis  . Peanut Oil Anaphylaxis  . Coconut Oil   . Other     Allergic to ALL NUTS  . Peanut-Containing Drug Products   . Shellfish Allergy    Review of systems: Review of systems negative except as noted in HPI / PMHx or noted below: Constitutional: Negative.  HENT: Negative.   Eyes: Negative.  Respiratory: Negative.   Cardiovascular: Negative.  Gastrointestinal: Negative.  Genitourinary: Negative.  Musculoskeletal: Negative.  Neurological: Negative.  Endo/Heme/Allergies: Negative.  Cutaneous: Negative.  Past Medical History:  Diagnosis Date  . ADHD  (attention deficit hyperactivity disorder)   . Asthma   . Autistic disorder   . Eczema     Family History  Problem Relation Age of Onset  . Eczema Mother   . Allergic rhinitis Neg Hx   . Angioedema Neg Hx   . Asthma Neg Hx   . Immunodeficiency Neg Hx   . Urticaria Neg Hx     Social History   Socioeconomic History  . Marital status: Single    Spouse name: Not on file  . Number of children: Not on file  . Years of education: Not on file  . Highest education level: Not on file  Social Needs  . Financial resource strain: Not on file  . Food insecurity - worry: Not on file  . Food insecurity - inability: Not on file  . Transportation needs - medical: Not on file  . Transportation needs - non-medical: Not on file  Occupational History  . Not on file  Tobacco Use  . Smoking status: Never Smoker  . Smokeless tobacco: Never Used  Substance and Sexual Activity  . Alcohol use: No  . Drug use: No  . Sexual activity: Not on file  Other Topics Concern  . Not on file  Social History Narrative  . Not on file    I appreciate the opportunity to take part in Tracie's care. Please do not hesitate to contact me with questions.  Sincerely,   R. Jorene Guest, MD

## 2017-10-15 NOTE — Assessment & Plan Note (Signed)
   Continue appropriate skin care measures, desonide twice daily to the face/neck if needed, and triamcinolone 0.1% ointment sparingly to affected areas on the body below the neck twice daily if needed. 

## 2017-10-15 NOTE — Assessment & Plan Note (Signed)
   Continue careful avoidance of nuts, shellfish, and egg and have access to epinephrine autoinjector 2 pack in case of accidental ingestion.  Food allergy action plan is in place.

## 2017-10-18 ENCOUNTER — Other Ambulatory Visit: Payer: Self-pay

## 2017-10-18 MED ORDER — AEROCHAMBER Z-STAT PLUS CHAMBR MISC: 1.0000 | Freq: Two times a day (BID) | 0 refills | Status: DC

## 2017-10-24 ENCOUNTER — Telehealth: Payer: Self-pay | Admitting: Allergy and Immunology

## 2017-10-24 NOTE — Telephone Encounter (Signed)
Patient was seen last week Given a plan/protocol to follow with meds and things Patient cough is improving Mom has questions about what to do now, and which parts of the protocol to follow and stop

## 2017-10-24 NOTE — Telephone Encounter (Signed)
Mom advised to continue plan as outlined by Dr. Nunzio CobbsBobbitt. Pt is feeling better, so mom will stop the Guifenasin and continue with Zyrtec for allergy symptoms.

## 2017-11-11 DIAGNOSIS — E785 Hyperlipidemia, unspecified: Secondary | ICD-10-CM | POA: Diagnosis not present

## 2017-11-11 DIAGNOSIS — J309 Allergic rhinitis, unspecified: Secondary | ICD-10-CM | POA: Diagnosis not present

## 2017-11-11 DIAGNOSIS — F411 Generalized anxiety disorder: Secondary | ICD-10-CM | POA: Diagnosis not present

## 2017-11-26 ENCOUNTER — Telehealth: Payer: Self-pay | Admitting: Allergy and Immunology

## 2017-11-26 DIAGNOSIS — R8290 Unspecified abnormal findings in urine: Secondary | ICD-10-CM | POA: Diagnosis not present

## 2017-11-26 NOTE — Telephone Encounter (Signed)
Patient was seen for breathing issues Given FLOVENT Patient has been taking it 2 times daily for a month for cough Patient is better and mother wants to know if patient can stop the medication all together Patient has an appt in 2 weeks to follow up on meds and breathing issues

## 2017-11-26 NOTE — Telephone Encounter (Signed)
Spoke to mother advised to keep using Flovent 2 puffs twice daily until appointment. Mother verbalized understanding.

## 2017-11-28 DIAGNOSIS — F419 Anxiety disorder, unspecified: Secondary | ICD-10-CM | POA: Diagnosis not present

## 2017-12-10 ENCOUNTER — Encounter: Payer: Self-pay | Admitting: Allergy and Immunology

## 2017-12-10 ENCOUNTER — Ambulatory Visit (INDEPENDENT_AMBULATORY_CARE_PROVIDER_SITE_OTHER): Payer: Medicare Other | Admitting: Allergy and Immunology

## 2017-12-10 VITALS — BP 100/60 | HR 76 | Temp 98.4°F | Resp 16 | Ht 67.0 in | Wt 163.0 lb

## 2017-12-10 DIAGNOSIS — R05 Cough: Secondary | ICD-10-CM | POA: Diagnosis not present

## 2017-12-10 DIAGNOSIS — L2089 Other atopic dermatitis: Secondary | ICD-10-CM | POA: Diagnosis not present

## 2017-12-10 DIAGNOSIS — T7800XD Anaphylactic reaction due to unspecified food, subsequent encounter: Secondary | ICD-10-CM | POA: Diagnosis not present

## 2017-12-10 DIAGNOSIS — J4530 Mild persistent asthma, uncomplicated: Secondary | ICD-10-CM

## 2017-12-10 DIAGNOSIS — J3089 Other allergic rhinitis: Secondary | ICD-10-CM | POA: Diagnosis not present

## 2017-12-10 DIAGNOSIS — B356 Tinea cruris: Secondary | ICD-10-CM | POA: Diagnosis not present

## 2017-12-10 DIAGNOSIS — R053 Chronic cough: Secondary | ICD-10-CM

## 2017-12-10 DIAGNOSIS — L739 Follicular disorder, unspecified: Secondary | ICD-10-CM | POA: Diagnosis not present

## 2017-12-10 DIAGNOSIS — B353 Tinea pedis: Secondary | ICD-10-CM | POA: Diagnosis not present

## 2017-12-10 MED ORDER — TRIAMCINOLONE ACETONIDE 0.1 % EX OINT: TOPICAL_OINTMENT | CUTANEOUS | 5 refills | Status: DC

## 2017-12-10 MED ORDER — EPINEPHRINE 0.3 MG/0.3ML IJ SOAJ: 0.3000 mg | Freq: Every day | INTRAMUSCULAR | 2 refills | Status: DC | PRN

## 2017-12-10 MED ORDER — DESONIDE 0.05 % EX OINT: TOPICAL_OINTMENT | CUTANEOUS | 5 refills | Status: DC

## 2017-12-10 MED ORDER — FLUTICASONE PROPIONATE HFA 110 MCG/ACT IN AERO
1.0000 | INHALATION_SPRAY | Freq: Two times a day (BID) | RESPIRATORY_TRACT | 5 refills | Status: DC
Start: 2017-12-10 — End: 2018-11-17

## 2017-12-10 NOTE — Progress Notes (Signed)
Follow-up Note  RE: Francisco Cisneros MRN: [id number] DOB: [birth year] Date of Office Visit: 12/10/2017  Primary care provider: Farris Has, MD Referring provider: Farris Has, MD  History of present illness: Francisco Cisneros is a 32 y.o. male with allergic rhinitis, atopic dermatitis, food allergy, and persistent asthma presented today for follow-up.  He was last seen in this clinic on October 15, 2017.  He is accompanied today by his mother who assists with the history.  Apparently, after starting Flovent 110 g, 2 inhalations via spacer device twice daily, his cough resolved and has not recurred.  In general, his asthma has been well controlled while taking the Flovent.  His eczema has been well controlled since he has been avoiding egg.  There are no nasal allergy symptom complaints today.  He avoids nuts, shellfish, and eggs and his caregivers have access to epinephrine autoinjectors.  Assessment and plan: Mild persistent asthma Well-controlled, we will stepdown therapy at this time.  Decrease Flovent 110 g to 1 inhalation via spacer device twice daily.  If after 2 months his asthma still well controlled he may again stepdown therapy to 1 inhalation via spacer device once daily.  If lower respiratory symptoms progress in frequency and/or severity, the patient is to resume the previous dose.  Continue albuterol HFA, 1 to 2 inhalations every 6 hours if needed.  Subjective and objective measures of pulmonary function will be followed and the treatment plan will be adjusted accordingly.  Other allergic rhinitis  Continue appropriate allergen avoidance measures, guaifenesin 400 mg every 6-8 hours if needed, fluticasone nasal spray daily, and nasal saline spray if needed.  Atopic dermatitis  Continue appropriate skin care measures, desonide twice daily to the face/neck if needed, and triamcinolone 0.1% ointment sparingly to affected areas on the body below the neck twice daily if  needed.  Food allergy  Continue meticulous avoidance of nuts, shellfish, and egg and have access to epinephrine autoinjector 2 pack in case of accidental ingestion.  Food allergy action plan is in place.  Cough, persistent Currently quiescent.    Treatment plan as outlined above.   Meds ordered this encounter  Medications  . fluticasone (FLOVENT HFA) 110 MCG/ACT inhaler    Sig: Inhale 1 puff into the lungs 2 (two) times daily.    Dispense:  1 Inhaler    Refill:  5  . triamcinolone ointment (KENALOG) 0.1 %    Sig: Apply sparingly to affected areas twice daily as needed, below the face.    Dispense:  30 g    Refill:  5    Pt request to have on hold due to ample amount of this meds at home  . desonide (DESOWEN) 0.05 % ointment    Sig: Apply sparingly to affected areas twice daily as needed to the face and/or neck.    Dispense:  15 g    Refill:  5  . EPINEPHrine 0.3 mg/0.3 mL IJ SOAJ injection    Sig: Inject 0.3 mLs (0.3 mg total) into the muscle daily as needed (allergic reaction).    Dispense:  1 Device    Refill:  2    Pt doesn't want filled but hold on file.    Diagnostics: Spirometry:  Normal with an FEV1 of 98% predicted.  Please see scanned spirometry results for details.    Physical examination: Blood pressure 100/60, pulse 76, temperature 98.4 F (36.9 C), temperature source Oral, resp. rate 16, height  (1.702 m), weight 163 lb (73.9 kg),  SpO2 98 %.  General: Alert, interactive, in no acute distress. HEENT: TMs pearly gray, turbinates mildly edematous with crusty discharge, post-pharynx mildly erythematous. Neck: Supple without lymphadenopathy. Lungs: Clear to auscultation without wheezing, rhonchi or rales. CV: Normal S1, S2 without murmurs. Skin: Warm and dry, without lesions or rashes.  The following portions of the patient's history were reviewed and updated as appropriate: allergies, current medications, past family history, past medical history, past  social history, past surgical history and problem list.  Allergies as of 12/10/2017      Reactions   Eggs Or Egg-derived Products Anaphylaxis   Peanut Oil Anaphylaxis   Coconut Oil    Other    Allergic to ALL NUTS   Peanut-containing Drug Products    Shellfish Allergy       Medication List        Accurate as of 12/10/17  6:33 PM. Always use your most recent med list.          acetaminophen 500 MG tablet Commonly known as:  TYLENOL Take 1,000 mg by mouth every 6 (six) hours as needed for moderate pain.   AEROCHAMBER Z-STAT PLUS CHAMBR Misc 1 each by Does not apply route 2 (two) times daily.   albuterol (2.5 MG/3ML) 0.083% nebulizer solution Commonly known as:  PROVENTIL Take 2.5 mg by nebulization every 6 (six) hours as needed for wheezing or shortness of breath.   PROAIR HFA 108 (90 Base) MCG/ACT inhaler Generic drug:  albuterol Inhale 2 puffs into the lungs every 4 (four) hours as needed for wheezing or shortness of breath.   cetirizine 10 MG tablet Commonly known as:  ZYRTEC Take 10 mg by mouth daily.   Ciclopirox 1 % shampoo Apply 1 Bottle topically at bedtime.   clindamycin 1 % gel Commonly known as:  CLINDAGEL Apply topically 2 (two) times daily.   CULTURELLE DIGESTIVE HEALTH PO Take 1 capsule by mouth daily.   desonide 0.05 % ointment Commonly known as:  DESOWEN Apply sparingly to affected areas twice daily as needed to the face and/or neck.   dexmethylphenidate 10 MG tablet Commonly known as:  FOCALIN Take 10 mg by mouth daily.   dexmethylphenidate 20 MG 24 hr capsule Commonly known as:  FOCALIN XR Take 20 mg by mouth every morning.   docusate sodium 100 MG capsule Commonly known as:  COLACE Take 100 mg by mouth daily as needed for mild constipation.   doxycycline 100 MG capsule Commonly known as:  VIBRAMYCIN Take 100 mg by mouth 2 (two) times daily.   EPINEPHrine 0.3 mg/0.3 mL Soaj injection Commonly known as:  EPI-PEN Inject 0.3 mLs (0.3 mg  total) into the muscle daily as needed (allergic reaction).   Fish Oil 645 MG Caps Take 640 mg by mouth 3 (three) times daily.   fluticasone 110 MCG/ACT inhaler Commonly known as:  FLOVENT HFA Inhale 1 puff into the lungs 2 (two) times daily.   fluticasone 50 MCG/ACT nasal spray Commonly known as:  FLONASE Place 2 sprays into both nostrils daily.   guaifenesin 400 MG Tabs tablet Commonly known as:  HUMIBID E Take 1 tablet (400 mg total) by mouth every 4 (four) hours.   LORazepam 0.5 MG tablet Commonly known as:  ATIVAN Take 0.5 mg by mouth daily as needed for anxiety.   Melatonin 3 MG Caps Take 3 mg by mouth at bedtime as needed (sleep).   PRESCRIPTION MEDICATION Apply 1 application topically daily as needed.   sertraline 100 MG tablet  Commonly known as:  ZOLOFT Take 100 mg by mouth daily.   SYSTANE ULTRA OP Apply 1 drop to eye daily as needed.   terbinafine 1 % cream Commonly known as:  LAMISIL Apply 1 application topically 2 (two) times daily.   triamcinolone ointment 0.1 % Commonly known as:  KENALOG Apply sparingly to affected areas twice daily as needed, below the face.       Allergies  Allergen Reactions  . Eggs Or Egg-Derived Products Anaphylaxis  . Peanut Oil Anaphylaxis  . Coconut Oil   . Other     Allergic to ALL NUTS  . Peanut-Containing Drug Products   . Shellfish Allergy    Review of systems: Review of systems negative except as noted in HPI / PMHx or noted below: Constitutional: Negative.  HENT: Negative.   Eyes: Negative.  Respiratory: Negative.   Cardiovascular: Negative.  Gastrointestinal: Negative.  Genitourinary: Negative.  Musculoskeletal: Negative.  Neurological: Negative.  Endo/Heme/Allergies: Negative.  Cutaneous: Negative.  Past Medical History:  Diagnosis Date  . ADHD (attention deficit hyperactivity disorder)   . Asthma   . Autistic disorder   . Eczema     Family History  Problem Relation Age of Onset  . Eczema  Mother   . Allergic rhinitis Neg Hx   . Angioedema Neg Hx   . Asthma Neg Hx   . Immunodeficiency Neg Hx   . Urticaria Neg Hx     Social History   Socioeconomic History  . Marital status: Single    Spouse name: Not on file  . Number of children: Not on file  . Years of education: Not on file  . Highest education level: Not on file  Occupational History  . Not on file  Social Needs  . Financial resource strain: Not on file  . Food insecurity:    Worry: Not on file    Inability: Not on file  . Transportation needs:    Medical: Not on file    Non-medical: Not on file  Tobacco Use  . Smoking status: Never Smoker  . Smokeless tobacco: Never Used  Substance and Sexual Activity  . Alcohol use: No  . Drug use: No  . Sexual activity: Not on file  Lifestyle  . Physical activity:    Days per week: Not on file    Minutes per session: Not on file  . Stress: Not on file  Relationships  . Social connections:    Talks on phone: Not on file    Gets together: Not on file    Attends religious service: Not on file    Active member of club or organization: Not on file    Attends meetings of clubs or organizations: Not on file    Relationship status: Not on file  . Intimate partner violence:    Fear of current or ex partner: Not on file    Emotionally abused: Not on file    Physically abused: Not on file    Forced sexual activity: Not on file  Other Topics Concern  . Not on file  Social History Narrative  . Not on file    I appreciate the opportunity to take part in Rodrigus's care. Please do not hesitate to contact me with questions.  Sincerely,   R. Jorene Guest, MD

## 2017-12-10 NOTE — Assessment & Plan Note (Signed)
Well-controlled, we will stepdown therapy at this time.  Decrease Flovent 110 g to 1 inhalation via spacer device twice daily.  If after 2 months his asthma still well controlled he may again stepdown therapy to 1 inhalation via spacer device once daily.  If lower respiratory symptoms progress in frequency and/or severity, the patient is to resume the previous dose.  Continue albuterol HFA, 1 to 2 inhalations every 6 hours if needed.  Subjective and objective measures of pulmonary function will be followed and the treatment plan will be adjusted accordingly.

## 2017-12-10 NOTE — Patient Instructions (Addendum)
Mild persistent asthma Well-controlled, we will stepdown therapy at this time.  Decrease Flovent 110 g to 1 inhalation via spacer device twice daily.  If after 2 months his asthma still well controlled he may again stepdown therapy to 1 inhalation via spacer device once daily.  If lower respiratory symptoms progress in frequency and/or severity, the patient is to resume the previous dose.  Continue albuterol HFA, 1 to 2 inhalations every 6 hours if needed.  Subjective and objective measures of pulmonary function will be followed and the treatment plan will be adjusted accordingly.  Other allergic rhinitis  Continue appropriate allergen avoidance measures, guaifenesin 400 mg every 6-8 hours if needed, fluticasone nasal spray daily, and nasal saline spray if needed.  Atopic dermatitis  Continue appropriate skin care measures, desonide twice daily to the face/neck if needed, and triamcinolone 0.1% ointment sparingly to affected areas on the body below the neck twice daily if needed.  Food allergy  Continue meticulous avoidance of nuts, shellfish, and egg and have access to epinephrine autoinjector 2 pack in case of accidental ingestion.  Food allergy action plan is in place.  Cough, persistent Currently quiescent.    Treatment plan as outlined above.   Return in about 6 months (around 06/12/2018), or if symptoms worsen or fail to improve.

## 2017-12-10 NOTE — Assessment & Plan Note (Signed)
   Continue meticulous avoidance of nuts, shellfish, and egg and have access to epinephrine autoinjector 2 pack in case of accidental ingestion.  Food allergy action plan is in place.

## 2017-12-10 NOTE — Assessment & Plan Note (Signed)
   Continue appropriate skin care measures, desonide twice daily to the face/neck if needed, and triamcinolone 0.1% ointment sparingly to affected areas on the body below the neck twice daily if needed. 

## 2017-12-10 NOTE — Assessment & Plan Note (Signed)
   Continue appropriate allergen avoidance measures, guaifenesin 400 mg every 6-8 hours if needed, fluticasone nasal spray daily, and nasal saline spray if needed.

## 2017-12-10 NOTE — Assessment & Plan Note (Signed)
Currently quiescent.  Treatment plan as outlined above. 

## 2018-01-21 IMAGING — CT CT ABD-PELV W/ CM
2 of 4 series · 15 of 46 positions shown, 17 images · IV contrast (agent unspecified)
Comparison: None.

CLINICAL DATA: Abdominal pain. Increased lethargy and confusion
from baseline. History of autistic disorder and ADHD.

EXAM:
CT ABDOMEN AND PELVIS WITH CONTRAST
TECHNIQUE: Multidetector CT imaging of the abdomen and pelvis was performed
using the standard protocol following bolus administration of
intravenous contrast.
CONTRAST:  100 mL Ksovue-R00

[Series 2: abd/pel with · axial · 0.66mm/px · z∈[+504,+988]mm · 12 of 107 slices shown, 14 images]
[im 5/107  soft-tissue]
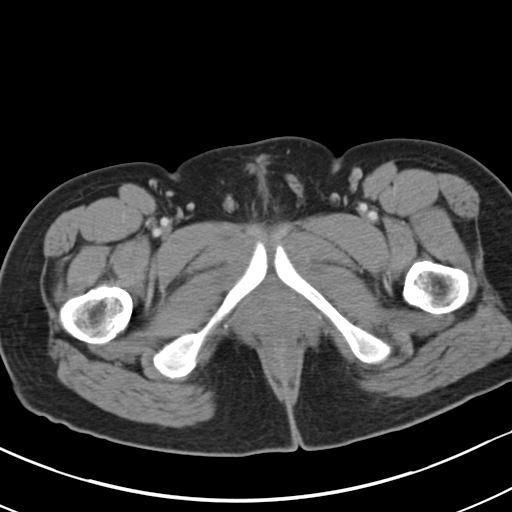
[im 5/107  bone]
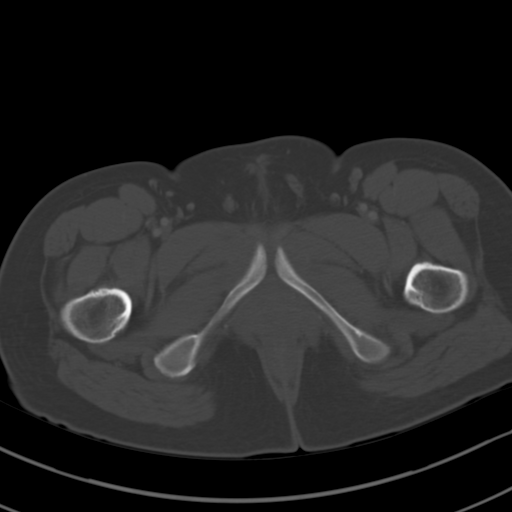
[im 15/107  soft-tissue]
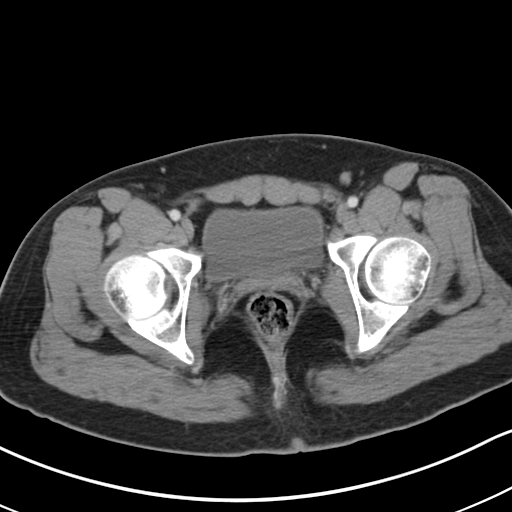
[im 25/107  soft-tissue]
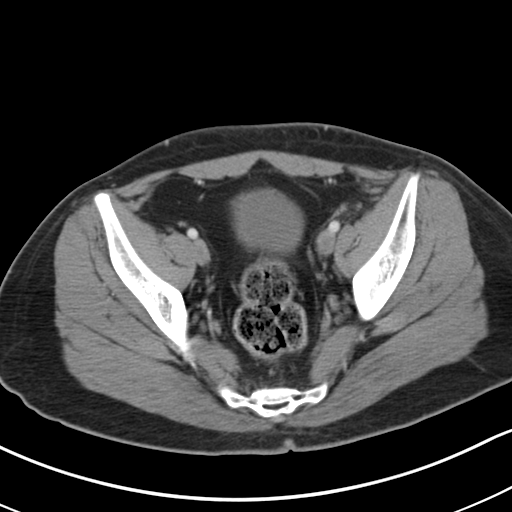
[im 34/107  soft-tissue]
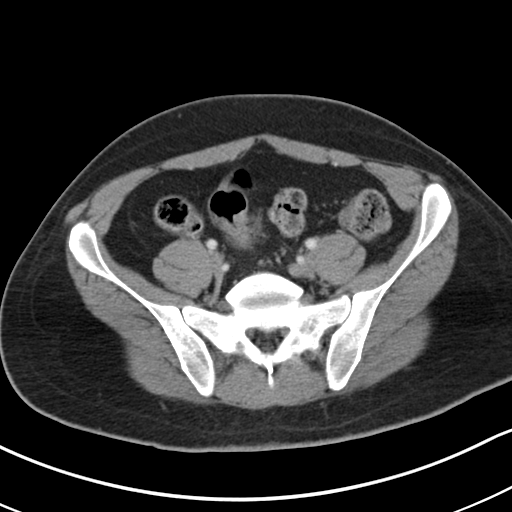
[im 39/107  soft-tissue]
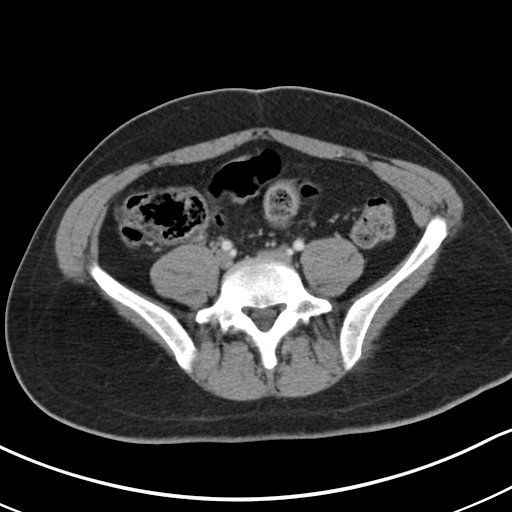
[im 49/107  soft-tissue]
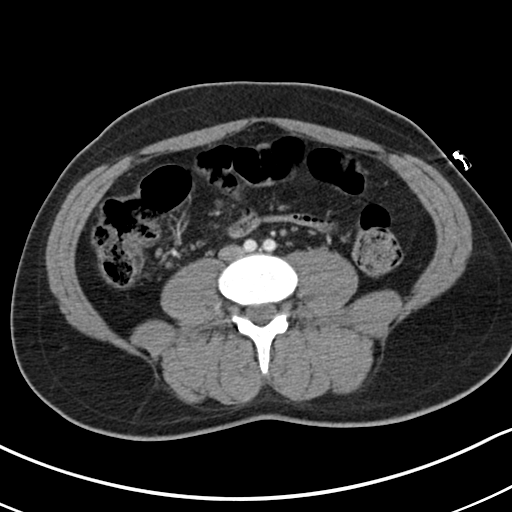
[im 58/107  soft-tissue]
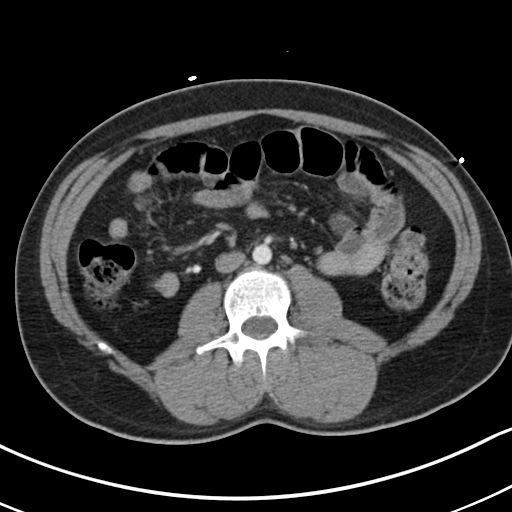
[im 68/107  soft-tissue]
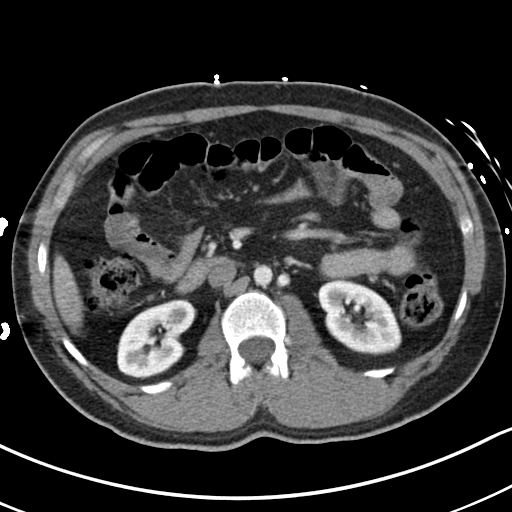
[im 73/107  soft-tissue]
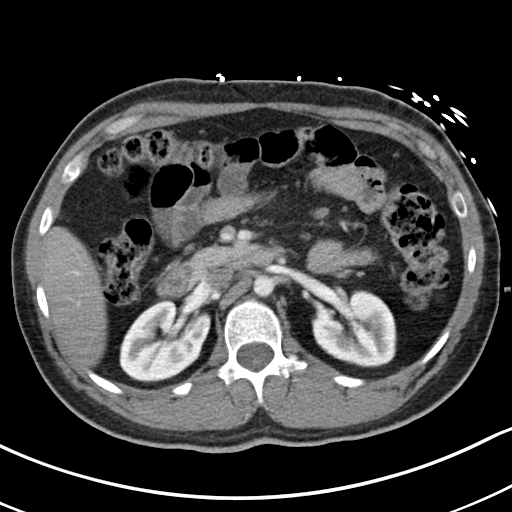
[im 73/107  bone]
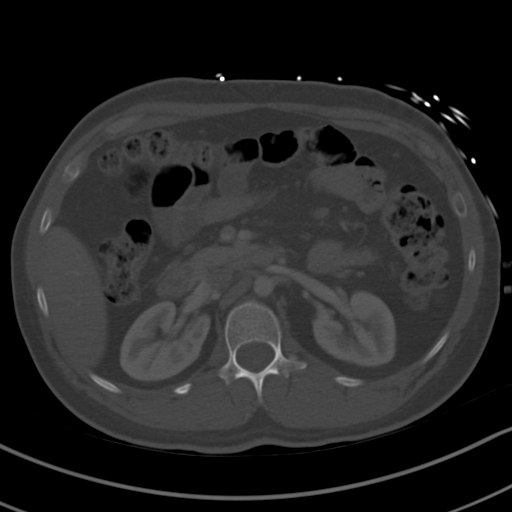
[im 82/107  soft-tissue]
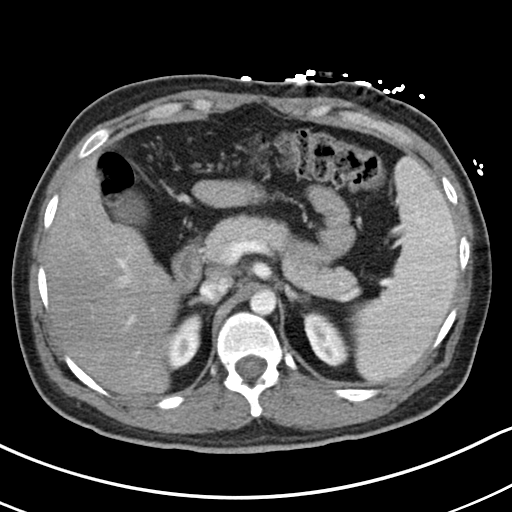
[im 92/107  soft-tissue]
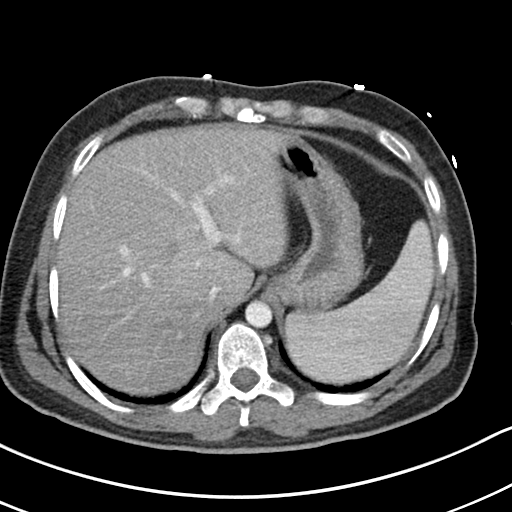
[im 102/107  soft-tissue]
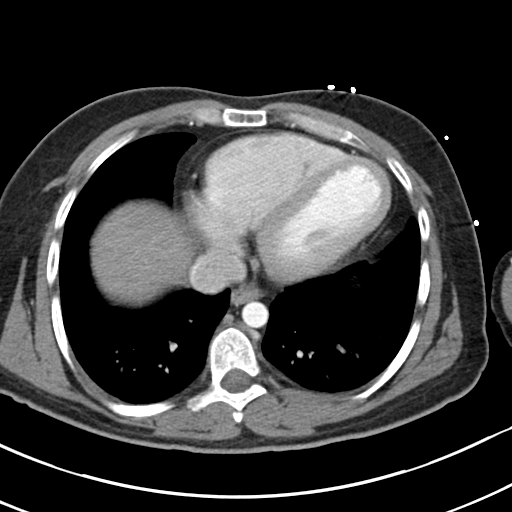

[Series 4: coronal a/|p · coronal · 0.62mm/px · 3 of 118 slices shown]
[im 40/118  soft-tissue]
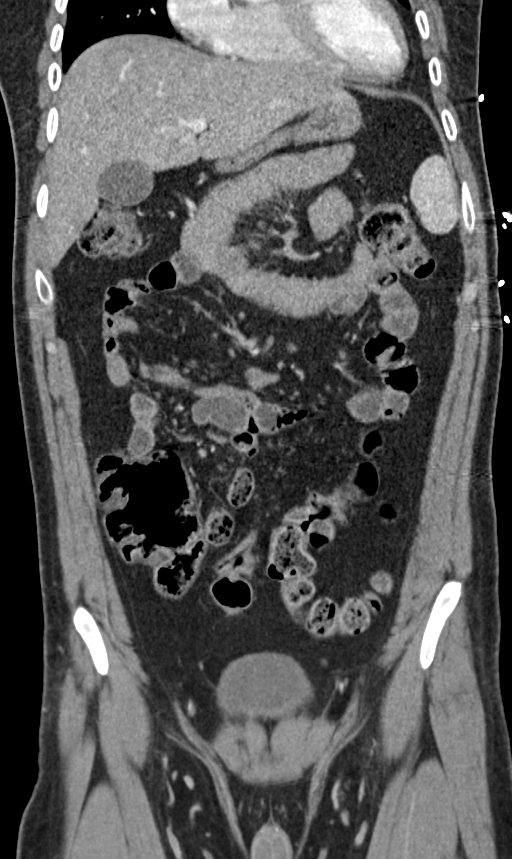
[im 53/118  soft-tissue]
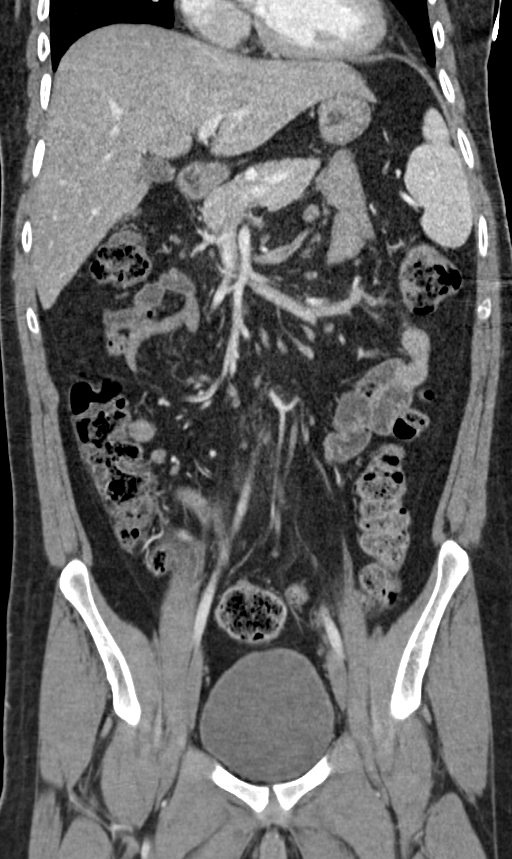
[im 66/118  soft-tissue]
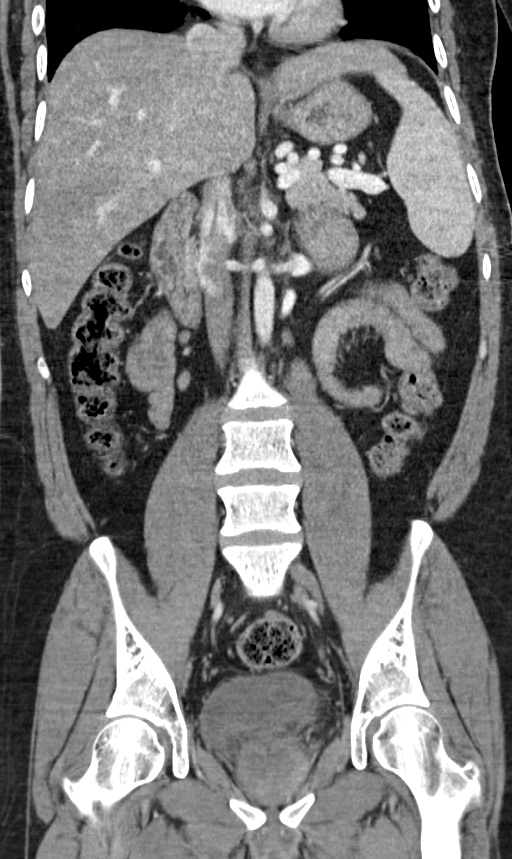

[15 of 46 positions shown; findings below may reference images not displayed]

FINDINGS: Lower chest: The lung bases are clear.

Hepatobiliary: Mild diffuse fatty infiltration of the liver. No
focal liver lesions. Gallbladder and bile ducts are unremarkable.

Pancreas: Unremarkable. No pancreatic ductal dilatation or
surrounding inflammatory changes.

Spleen: Normal in size without focal abnormality.

Adrenals/Urinary Tract: Adrenal glands are unremarkable. Kidneys are
normal, without renal calculi, focal lesion, or hydronephrosis.
Bladder is unremarkable.

Stomach/Bowel: Stomach, small bowel, and colon are not abnormally
distended. No wall thickening or inflammatory changes appreciated.
Stool diffusely throughout the colon. The appendix is normal.
Scattered mesenteric and right lower quadrant lymph nodes without
pathologic enlargement may be reactive or inflammatory. Can't
exclude mesenteric adenitis.

Vascular/Lymphatic: No significant vascular findings are present. No
enlarged abdominal or pelvic lymph nodes.

Reproductive: Prostate is unremarkable.

Other: Small periumbilical hernia containing fat. No free air or
free fluid in the abdomen.

Musculoskeletal: No acute or significant osseous findings.
IMPRESSION: 1. Normal appendix.
2. Right lower quadrant lymph nodes without pathologic enlargement
may indicate reactive nodes or mesenteric adenitis.
3. No evidence of bowel obstruction or inflammation.
4. Mild diffuse fatty infiltration of the liver.

## 2018-02-26 DIAGNOSIS — F419 Anxiety disorder, unspecified: Secondary | ICD-10-CM | POA: Diagnosis not present

## 2018-04-29 DIAGNOSIS — Z23 Encounter for immunization: Secondary | ICD-10-CM | POA: Diagnosis not present

## 2018-06-03 DIAGNOSIS — F419 Anxiety disorder, unspecified: Secondary | ICD-10-CM | POA: Diagnosis not present

## 2018-06-10 ENCOUNTER — Ambulatory Visit: Payer: Self-pay | Admitting: Allergy and Immunology

## 2018-06-10 DIAGNOSIS — Z792 Long term (current) use of antibiotics: Secondary | ICD-10-CM | POA: Diagnosis not present

## 2018-06-10 DIAGNOSIS — R531 Weakness: Secondary | ICD-10-CM | POA: Diagnosis not present

## 2018-06-10 DIAGNOSIS — R109 Unspecified abdominal pain: Secondary | ICD-10-CM | POA: Diagnosis not present

## 2018-06-10 DIAGNOSIS — F909 Attention-deficit hyperactivity disorder, unspecified type: Secondary | ICD-10-CM | POA: Diagnosis not present

## 2018-06-10 DIAGNOSIS — Z91018 Allergy to other foods: Secondary | ICD-10-CM | POA: Diagnosis not present

## 2018-06-10 DIAGNOSIS — F840 Autistic disorder: Secondary | ICD-10-CM | POA: Diagnosis not present

## 2018-06-10 DIAGNOSIS — Z7951 Long term (current) use of inhaled steroids: Secondary | ICD-10-CM | POA: Diagnosis not present

## 2018-06-10 DIAGNOSIS — F411 Generalized anxiety disorder: Secondary | ICD-10-CM | POA: Diagnosis not present

## 2018-06-10 DIAGNOSIS — Z91013 Allergy to seafood: Secondary | ICD-10-CM | POA: Diagnosis not present

## 2018-06-10 DIAGNOSIS — Z91012 Allergy to eggs: Secondary | ICD-10-CM | POA: Diagnosis not present

## 2018-06-10 DIAGNOSIS — R4689 Other symptoms and signs involving appearance and behavior: Secondary | ICD-10-CM | POA: Diagnosis not present

## 2018-06-10 DIAGNOSIS — R001 Bradycardia, unspecified: Secondary | ICD-10-CM | POA: Diagnosis not present

## 2018-06-10 DIAGNOSIS — R0989 Other specified symptoms and signs involving the circulatory and respiratory systems: Secondary | ICD-10-CM | POA: Diagnosis not present

## 2018-06-10 DIAGNOSIS — I864 Gastric varices: Secondary | ICD-10-CM | POA: Diagnosis not present

## 2018-06-10 DIAGNOSIS — R161 Splenomegaly, not elsewhere classified: Secondary | ICD-10-CM | POA: Diagnosis not present

## 2018-06-10 DIAGNOSIS — R410 Disorientation, unspecified: Secondary | ICD-10-CM | POA: Diagnosis not present

## 2018-06-10 DIAGNOSIS — J4520 Mild intermittent asthma, uncomplicated: Secondary | ICD-10-CM | POA: Diagnosis not present

## 2018-06-10 DIAGNOSIS — R4182 Altered mental status, unspecified: Secondary | ICD-10-CM | POA: Diagnosis not present

## 2018-06-10 DIAGNOSIS — Z79899 Other long term (current) drug therapy: Secondary | ICD-10-CM | POA: Diagnosis not present

## 2018-06-10 DIAGNOSIS — M41119 Juvenile idiopathic scoliosis, site unspecified: Secondary | ICD-10-CM | POA: Diagnosis not present

## 2018-06-11 ENCOUNTER — Ambulatory Visit (INDEPENDENT_AMBULATORY_CARE_PROVIDER_SITE_OTHER): Payer: Medicare Other | Admitting: Allergy and Immunology

## 2018-06-11 ENCOUNTER — Encounter: Payer: Self-pay | Admitting: Allergy and Immunology

## 2018-06-11 VITALS — BP 106/70 | HR 72 | Temp 98.1°F | Resp 16 | Ht 69.0 in | Wt 165.3 lb

## 2018-06-11 DIAGNOSIS — T7800XD Anaphylactic reaction due to unspecified food, subsequent encounter: Secondary | ICD-10-CM | POA: Diagnosis not present

## 2018-06-11 DIAGNOSIS — L2089 Other atopic dermatitis: Secondary | ICD-10-CM

## 2018-06-11 DIAGNOSIS — J4530 Mild persistent asthma, uncomplicated: Secondary | ICD-10-CM | POA: Diagnosis not present

## 2018-06-11 DIAGNOSIS — J3089 Other allergic rhinitis: Secondary | ICD-10-CM | POA: Diagnosis not present

## 2018-06-11 MED ORDER — DESONIDE 0.05 % EX OINT: TOPICAL_OINTMENT | CUTANEOUS | 5 refills | Status: DC

## 2018-06-11 MED ORDER — TRIAMCINOLONE ACETONIDE 0.1 % EX OINT: TOPICAL_OINTMENT | CUTANEOUS | 5 refills | Status: DC

## 2018-06-11 MED ORDER — IOPAMIDOL (ISOVUE-370) INJECTION 76%
100.00 | INTRAVENOUS | Status: DC
Start: ? — End: 2018-06-11

## 2018-06-11 NOTE — Assessment & Plan Note (Signed)
   Continue appropriate allergen avoidance measures, guaifenesin 400 mg every 6-8 hours if needed, fluticasone nasal spray daily, and nasal saline spray if needed. 

## 2018-06-11 NOTE — Patient Instructions (Signed)
Mild persistent asthma  For now, continue Flovent 110 g, 1 inhalation via spacer device daily, and albuterol HFA, 1-2 elations every 4-6 hours if needed.  Subjective and objective measures of pulmonary function will be followed and the treatment plan will be adjusted accordingly.  Other allergic rhinitis  Continue appropriate allergen avoidance measures, guaifenesin 400 mg every 6-8 hours if needed, fluticasone nasal spray daily, and nasal saline spray if needed.  Atopic dermatitis  Continue appropriate skin care measures, desonide twice daily to the face/neck if needed, and triamcinolone 0.1% ointment sparingly to affected areas on the body below the neck twice daily if needed.   Return in about 1 month (around 07/11/2018), or if symptoms worsen or fail to improve.

## 2018-06-11 NOTE — Assessment & Plan Note (Addendum)
   For now, continue Flovent 110 g, 1 inhalation via spacer device daily, and albuterol HFA, 1-2 elations every 4-6 hours if needed.  During respiratory tract infections or asthma flares, increase Flovent 110g to 3 inhalations via spacer device 2 times per day until symptoms have returned to baseline.  Subjective and objective measures of pulmonary function will be followed and the treatment plan will be adjusted accordingly.

## 2018-06-11 NOTE — Assessment & Plan Note (Signed)
   Continue appropriate skin care measures, desonide twice daily to the face/neck if needed, and triamcinolone 0.1% ointment sparingly to affected areas on the body below the neck twice daily if needed. 

## 2018-06-11 NOTE — Progress Notes (Signed)
Follow-up Note  RE: Francisco Cisneros MRN: [id number] DOB: Oct 09, 1985 Date of Office Visit: 06/11/2018  Primary care provider: Farris Has, MD Referring provider: Farris Has, MD  History of present illness: Francisco Cisneros is a 32 y.o. male with persistent asthma and allergic rhinitis presenting today for follow-up.  He was last seen in this clinic on Dec 10, 2017.  He is accompanied today by his parents who assist with the history.  In the interval since his previous visit his asthma has been well controlled.  He has rarely required albuterol rescue and does not experience limitations in normal daily activities due to asthma symptoms.  In addition, his nasal symptoms have been well controlled.  He was taken to the emergency department yesterday for altered mental status.  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  He will be seen a gastroenterologist in the next day or 2 for further evaluation.  His parents report that he is still somnolent and not himself.   Assessment and plan: Mild persistent asthma  For now, continue Flovent 110 g, 1 inhalation via spacer device daily, and albuterol HFA, 1-2 elations every 4-6 hours if needed.  During respiratory tract infections or asthma flares, increase Flovent 110g to 3 inhalations via spacer device 2 times per day until symptoms have returned to baseline.  Subjective and objective measures of pulmonary function will be followed and the treatment plan will be adjusted accordingly.  Other allergic rhinitis  Continue appropriate allergen avoidance measures, guaifenesin 400 mg every 6-8 hours if needed, fluticasone nasal spray daily, and nasal saline spray if needed.  Atopic dermatitis  Continue appropriate skin care measures, desonide twice daily to the face/neck if needed, and triamcinolone 0.1% ointment sparingly to affected areas on the body below the neck twice daily if needed.  Food allergy  Continue meticulous avoidance of nuts,  shellfish, and egg and have access to epinephrine autoinjector 2 pack in case of accidental ingestion.  Food allergy action plan is in place.   Meds ordered this encounter  Medications  . desonide (DESOWEN) 0.05 % ointment    Sig: Apply sparingly to affected areas twice daily as needed to the face and/or neck.    Dispense:  15 g    Refill:  5  . triamcinolone ointment (KENALOG) 0.1 %    Sig: Apply sparingly to affected areas twice daily as needed, below the face.    Dispense:  30 g    Refill:  5    Pt request to have on hold due to ample amount of this meds at home    Diagnostics: Spirometry reveals an FVC of 2.77 L and an FEV1 of 2.53 L (58% predicted) with an FEV1 ratio of 111%.  FVC and FEV1 are significantly lower than previous studies, however the patient claimed to be tired and did not to put forth appropriate effort.  Please see scanned spirometry results for details.    Physical examination: Blood pressure 106/70, pulse 72, temperature 98.1 F (36.7 C), temperature source Oral, resp. rate 16, height 5\' 9"  (1.753 m), weight 165 lb 5.5 oz (75 kg), SpO2 97 %.  General: Fatigued, in no acute distress. HEENT: TMs pearly gray, turbinates mildly edematous without discharge, post-pharynx unremarkable. Neck: Supple without lymphadenopathy. Lungs: Clear to auscultation without wheezing, rhonchi or rales. CV: Normal S1, S2 without murmurs. Skin: Warm and dry, without lesions or rashes.  The following portions of the patient's history were reviewed and updated as appropriate: allergies, current medications, past family history,  past medical history, past social history, past surgical history and problem list.  Allergies as of 06/11/2018      Reactions   Eggs Or Egg-derived Products Anaphylaxis   Other Anaphylaxis   Allergic to ALL NUTS   Peanut Oil Anaphylaxis   Shellfish Allergy Anaphylaxis   Coconut Oil Rash   Peanut-containing Drug Products       Medication List         Accurate as of 06/11/18  5:20 PM. Always use your most recent med list.          acetaminophen 500 MG tablet Commonly known as:  TYLENOL Take 1,000 mg by mouth every 6 (six) hours as needed for moderate pain.   AEROCHAMBER Z-STAT PLUS CHAMBR Misc 1 each by Does not apply route 2 (two) times daily.   albuterol (2.5 MG/3ML) 0.083% nebulizer solution Commonly known as:  PROVENTIL Take 2.5 mg by nebulization every 6 (six) hours as needed for wheezing or shortness of breath.   PROAIR HFA 108 (90 Base) MCG/ACT inhaler Generic drug:  albuterol Inhale 2 puffs into the lungs every 4 (four) hours as needed for wheezing or shortness of breath.   cetirizine 10 MG tablet Commonly known as:  ZYRTEC Take 10 mg by mouth daily.   Ciclopirox 1 % shampoo Apply 1 Bottle topically at bedtime.   clindamycin 1 % gel Commonly known as:  CLINDAGEL Apply topically 2 (two) times daily.   CULTURELLE DIGESTIVE HEALTH PO Take 1 capsule by mouth daily.   desonide 0.05 % ointment Commonly known as:  DESOWEN Apply sparingly to affected areas twice daily as needed to the face and/or neck.   dexmethylphenidate 10 MG tablet Commonly known as:  FOCALIN Take 10 mg by mouth daily.   dexmethylphenidate 20 MG 24 hr capsule Commonly known as:  FOCALIN XR Take 20 mg by mouth every morning.   docusate sodium 100 MG capsule Commonly known as:  COLACE Take 100 mg by mouth daily as needed for mild constipation.   EPINEPHrine 0.3 mg/0.3 mL Soaj injection Commonly known as:  EPI-PEN Inject 0.3 mLs (0.3 mg total) into the muscle daily as needed (allergic reaction).   Fish Oil 645 MG Caps Take 640 mg by mouth 3 (three) times daily.   fluticasone 110 MCG/ACT inhaler Commonly known as:  FLOVENT HFA Inhale 1 puff into the lungs 2 (two) times daily.   fluticasone 50 MCG/ACT nasal spray Commonly known as:  FLONASE Place 2 sprays into both nostrils daily.   LORazepam 0.5 MG tablet Commonly known as:   ATIVAN Take 0.5 mg by mouth daily as needed for anxiety.   Melatonin 3 MG Caps Take 3 mg by mouth at bedtime as needed (sleep).   sertraline 100 MG tablet Commonly known as:  ZOLOFT Take 100 mg by mouth daily.   SYSTANE ULTRA OP Apply 1 drop to eye daily as needed.   terbinafine 1 % cream Commonly known as:  LAMISIL Apply 1 application topically 2 (two) times daily.   triamcinolone ointment 0.1 % Commonly known as:  KENALOG Apply sparingly to affected areas twice daily as needed, below the face.       Allergies  Allergen Reactions  . Eggs Or Egg-Derived Products Anaphylaxis  . Other Anaphylaxis    Allergic to ALL NUTS  . Peanut Oil Anaphylaxis  . Shellfish Allergy Anaphylaxis  . Coconut Oil Rash  . Peanut-Containing Drug Products     I appreciate the opportunity to take part in Reiner's  care. Please do not hesitate to contact me with questions.  Sincerely,   R. Edgar Frisk, MD

## 2018-06-11 NOTE — Assessment & Plan Note (Signed)
   Continue meticulous avoidance of nuts, shellfish, and egg and have access to epinephrine autoinjector 2 pack in case of accidental ingestion.  Food allergy action plan is in place. 

## 2018-06-13 DIAGNOSIS — R161 Splenomegaly, not elsewhere classified: Secondary | ICD-10-CM | POA: Diagnosis not present

## 2018-06-13 DIAGNOSIS — R933 Abnormal findings on diagnostic imaging of other parts of digestive tract: Secondary | ICD-10-CM | POA: Diagnosis not present

## 2018-06-13 DIAGNOSIS — F840 Autistic disorder: Secondary | ICD-10-CM | POA: Diagnosis not present

## 2018-06-15 DIAGNOSIS — R9389 Abnormal findings on diagnostic imaging of other specified body structures: Secondary | ICD-10-CM | POA: Diagnosis not present

## 2018-06-17 ENCOUNTER — Telehealth: Payer: Self-pay | Admitting: Hematology and Oncology

## 2018-06-17 NOTE — Telephone Encounter (Signed)
Pt has been scheduled to see Dr. Caron Presume on 11/19 at 11am. Pt's mom is aware to arrive 30 minutes early.

## 2018-06-18 DIAGNOSIS — R9389 Abnormal findings on diagnostic imaging of other specified body structures: Secondary | ICD-10-CM | POA: Diagnosis not present

## 2018-06-18 DIAGNOSIS — J45909 Unspecified asthma, uncomplicated: Secondary | ICD-10-CM | POA: Diagnosis not present

## 2018-06-18 DIAGNOSIS — Z9889 Other specified postprocedural states: Secondary | ICD-10-CM | POA: Insufficient documentation

## 2018-06-18 DIAGNOSIS — F419 Anxiety disorder, unspecified: Secondary | ICD-10-CM | POA: Diagnosis not present

## 2018-06-18 DIAGNOSIS — E785 Hyperlipidemia, unspecified: Secondary | ICD-10-CM | POA: Diagnosis not present

## 2018-06-18 DIAGNOSIS — R933 Abnormal findings on diagnostic imaging of other parts of digestive tract: Secondary | ICD-10-CM | POA: Diagnosis not present

## 2018-06-18 DIAGNOSIS — F909 Attention-deficit hyperactivity disorder, unspecified type: Secondary | ICD-10-CM | POA: Diagnosis not present

## 2018-06-18 DIAGNOSIS — I868 Varicose veins of other specified sites: Secondary | ICD-10-CM | POA: Diagnosis not present

## 2018-06-24 ENCOUNTER — Inpatient Hospital Stay: Payer: Medicare Other | Attending: Hematology and Oncology | Admitting: Hematology and Oncology

## 2018-06-24 ENCOUNTER — Encounter: Payer: Self-pay | Admitting: Hematology and Oncology

## 2018-06-24 ENCOUNTER — Inpatient Hospital Stay: Payer: Medicare Other

## 2018-06-24 VITALS — BP 128/83 | HR 75 | Temp 97.4°F | Resp 18 | Ht 69.0 in | Wt 163.7 lb

## 2018-06-24 DIAGNOSIS — F419 Anxiety disorder, unspecified: Secondary | ICD-10-CM | POA: Insufficient documentation

## 2018-06-24 DIAGNOSIS — D751 Secondary polycythemia: Secondary | ICD-10-CM

## 2018-06-24 DIAGNOSIS — Z79899 Other long term (current) drug therapy: Secondary | ICD-10-CM | POA: Diagnosis not present

## 2018-06-24 DIAGNOSIS — K7460 Unspecified cirrhosis of liver: Secondary | ICD-10-CM

## 2018-06-24 DIAGNOSIS — K5900 Constipation, unspecified: Secondary | ICD-10-CM | POA: Diagnosis not present

## 2018-06-24 DIAGNOSIS — F840 Autistic disorder: Secondary | ICD-10-CM | POA: Diagnosis not present

## 2018-06-24 DIAGNOSIS — R197 Diarrhea, unspecified: Secondary | ICD-10-CM | POA: Diagnosis not present

## 2018-06-24 DIAGNOSIS — K760 Fatty (change of) liver, not elsewhere classified: Secondary | ICD-10-CM | POA: Insufficient documentation

## 2018-06-24 DIAGNOSIS — J45909 Unspecified asthma, uncomplicated: Secondary | ICD-10-CM | POA: Insufficient documentation

## 2018-06-24 DIAGNOSIS — E785 Hyperlipidemia, unspecified: Secondary | ICD-10-CM | POA: Diagnosis not present

## 2018-06-24 DIAGNOSIS — F909 Attention-deficit hyperactivity disorder, unspecified type: Secondary | ICD-10-CM | POA: Insufficient documentation

## 2018-06-24 DIAGNOSIS — I8500 Esophageal varices without bleeding: Secondary | ICD-10-CM

## 2018-06-24 DIAGNOSIS — I4510 Unspecified right bundle-branch block: Secondary | ICD-10-CM | POA: Insufficient documentation

## 2018-06-24 DIAGNOSIS — R161 Splenomegaly, not elsewhere classified: Secondary | ICD-10-CM | POA: Diagnosis not present

## 2018-06-24 DIAGNOSIS — R634 Abnormal weight loss: Secondary | ICD-10-CM | POA: Diagnosis not present

## 2018-06-24 LAB — COMPREHENSIVE METABOLIC PANEL
ALK PHOS: 55 U/L (ref 38–126)
ALT: 15 U/L (ref 0–44)
AST: 17 U/L (ref 15–41)
Albumin: 4.5 g/dL (ref 3.5–5.0)
Anion gap: 10 (ref 5–15)
BUN: 11 mg/dL (ref 6–20)
CALCIUM: 9.9 mg/dL (ref 8.9–10.3)
CO2: 30 mmol/L (ref 22–32)
Chloride: 102 mmol/L (ref 98–111)
Creatinine, Ser: 0.92 mg/dL (ref 0.61–1.24)
GLUCOSE: 78 mg/dL (ref 70–99)
POTASSIUM: 4 mmol/L (ref 3.5–5.1)
Sodium: 142 mmol/L (ref 135–145)
TOTAL PROTEIN: 7.8 g/dL (ref 6.5–8.1)
Total Bilirubin: 0.9 mg/dL (ref 0.3–1.2)

## 2018-06-24 LAB — CBC WITH DIFFERENTIAL (CANCER CENTER ONLY)
ABS IMMATURE GRANULOCYTES: 0.01 10*3/uL (ref 0.00–0.07)
BASOS ABS: 0.1 10*3/uL (ref 0.0–0.1)
Basophils Relative: 1 %
Eosinophils Absolute: 0.2 10*3/uL (ref 0.0–0.5)
Eosinophils Relative: 5 %
HEMATOCRIT: 49.1 % (ref 39.0–52.0)
HEMOGLOBIN: 16.8 g/dL (ref 13.0–17.0)
Immature Granulocytes: 0 %
LYMPHS ABS: 1.5 10*3/uL (ref 0.7–4.0)
LYMPHS PCT: 33 %
MCH: 29.1 pg (ref 26.0–34.0)
MCHC: 34.2 g/dL (ref 30.0–36.0)
MCV: 84.9 fL (ref 80.0–100.0)
Monocytes Absolute: 0.3 10*3/uL (ref 0.1–1.0)
Monocytes Relative: 7 %
NEUTROS ABS: 2.4 10*3/uL (ref 1.7–7.7)
Neutrophils Relative %: 54 %
Platelet Count: 200 10*3/uL (ref 150–400)
RBC: 5.78 MIL/uL (ref 4.22–5.81)
RDW: 12.4 % (ref 11.5–15.5)
WBC: 4.4 10*3/uL (ref 4.0–10.5)
nRBC: 0 % (ref 0.0–0.2)

## 2018-06-24 LAB — IRON AND TIBC
IRON: 75 ug/dL (ref 42–163)
Saturation Ratios: 23 % (ref 20–55)
TIBC: 323 ug/dL (ref 202–409)
UIBC: 248 ug/dL (ref 117–376)

## 2018-06-24 LAB — FERRITIN: Ferritin: 84 ng/mL (ref 24–336)

## 2018-06-24 LAB — DIRECT ANTIGLOBULIN TEST (NOT AT ARMC)
DAT, IgG: NEGATIVE
DAT, complement: NEGATIVE

## 2018-06-24 LAB — LACTATE DEHYDROGENASE: LDH: 140 U/L (ref 98–192)

## 2018-06-24 NOTE — Patient Instructions (Signed)
We discussed in detail the previous laboratory studies which show a normal white blood cell and platelet count.  The red blood cells are slightly elevated.  Because of splenomegaly, laboratory studies were obtained to exclude an underlying connective tissue disorder, infectious explanation, premature destruction of cells, or underlying bone marrow disorder.  Barring any unforeseen complications, your next scheduled doctor visit to discuss those results is on December 5.  Please do not hesitate to call in the interim should any new or untoward problems arise.  Thank you! Happy Thanksgiving  Debbe Odeaichard Daion Ginsberg, MD Hematology/Oncology

## 2018-06-24 NOTE — Progress Notes (Signed)
Outpatient Hematology/Oncology Initial Consultation  Patient Name:  Francisco Cisneros  DOB: Dec 14, 1985   Date of Service: June 24, 2018  Referring Provider: Farris Has, MD [address] [place], [place] [address]   Consulting Physician: Toni Arthurs, MD Hematology/Oncology  Reason for Referral: In the setting of splenomegaly of unclear etiology, he presents now for further diagnostic and therapeutic recommendations.  History Present Illness: Francisco Cisneros is a 32 year old resident of [place] whose past medical history is significant for attention deficit hyperactivity disorder; autism; allergic rhinitis; anxiety disorder/panic attacks; juvenile idiopathic scoliosis; bronchial asthma; hyperlipidemia; fatty liver; right bundle branch block; allergic eczema; and chronic constipation.  He is currently living with his parents.  [facility].  On November 5, he suddenly disappeared and went off into the woods for most of the day.  When he was discovered by the police, he was having hallucinations and memory difficulty.  At that time he stated that he was "hurting all over and wanted to rest."  He originally presented to the [facility] emergency department on November 5 with altered mental status.  On June 10, 2018: A complete blood count showed hemoglobin 16.8 hematocrit 48.6 MCV 85 MCH 29.5 RDW 38.8 WBC 5.7 with 54% neutrophils 32% lymphocytes 6% monocytes 6% eosinophils 1% basophil; platelets 169,000. CT imaging of the head without intravenous contrast showed no acute intracranial abnormality.  Both salicylate and acetaminophen levels were low.  CT imaging of the abdomen and pelvis with intravenous contrast revealed a normal liver, pancreas, adrenal glands, and kidneys.  There is no bowel obstruction or evidence of acute appendicitis. The spleen was enlarged measuring 14 x 7 cm. Upper  abdominal varices raised the suspicion for portal hypertension.  His primary care physician is Dr. Farris Has.  He was also seen and followed by Dr. Serita Grammes, [facility], gastroenterology.  On November 13, an upper endoscopic evaluation was performed.  Those results revealed a regular Z-line 40 cm from the incisors. The esophagus, cardia, fundus of the stomach, body of the stomach, antrum, duodenal bulb, 2nd part of the duodenum and ampullary region appeared normal.  Endoscopically, random biopsies were performed from the stomach and duodenum. No evidence of gastroesophageal varices. A gastric biopsy for Helicobacter pylori was obtained.  A duodenal biopsy was obtained for celiac disease. Biopsies of the stomach revealed oxyntic and antral mucosa with no significant pathologic diagnosis.  Helicobacter pylori stains were negative by H&E.  The duodenal mucosa showed no significant pathologic diagnosis. There is no villous blunting or intraepithelial lymphocytosis seen.    His appetite is relatively stable.  He has lost 4 pounds over the past several weeks.  He denies visual changes or hearing deficit.  He has no thyroid disease.  There is no cough, sore throat, orthopnea.  He denies dyspnea either at rest or on minimal exertion.  He has no pain or difficulty in swallowing.  No fever, shaking chills, sweats, or flulike symptoms are evident.  He has no heartburn or indigestion.  There is alternating diarrhea and constipation with a tendency toward constipation since childhood.  He has no viral hepatitis, inflammatory bowel disease, or known diverticulosis.  He denies melena or bright red blood per rectum.  There is no urinary frequency, urgency, hematuria, dysuria.  He has no swelling of his ankles.  There are no new arthralgias or myalgias.  He has no bleeding tendency or easy bruisability.  He has  no prior history of peripheral arterial or venous thromboembolic disease.  It is with this background he  presents now for further diagnostic and therapeutic recommendations in the setting of splenomegaly as outlined above.  Past Medical History:  Diagnosis Date  . ADHD (attention deficit hyperactivity disorder)   . Asthma   . Autistic disorder   . Eczema    Past Surgical History:  Procedure Laterality Date  . WISDOM TOOTH EXTRACTION  2009   Family History  Problem Relation Age of Onset  . Eczema Mother   . Allergic rhinitis Neg Hx   . Angioedema Neg Hx   . Asthma Neg Hx   . Immunodeficiency Neg Hx   . Urticaria Neg Hx   Mother: Age 7 years: Osteoporosis Father: Age 31 years: Prostate cancer He has no biological brothers or sisters  Social History   Socioeconomic History  . Marital status: Single    Spouse name: Not on file  . Number of children: Not on file  . Years of education: Not on file  . Highest education level: Not on file  Occupational History  . Not on file  Social Needs  . Financial resource strain: Not on file  . Food insecurity:    Worry: Not on file    Inability: Not on file  . Transportation needs:    Medical: Not on file    Non-medical: Not on file  Tobacco Use  . Smoking status: Never Smoker  . Smokeless tobacco: Never Used  Substance and Sexual Activity  . Alcohol use: No  . Drug use: No  . Sexual activity: Not on file  Lifestyle  . Physical activity:    Days per week: Not on file    Minutes per session: Not on file  . Stress: Not on file  Relationships  . Social connections:    Talks on phone: Not on file    Gets together: Not on file    Attends religious service: Not on file    Active member of club or organization: Not on file    Attends meetings of clubs or organizations: Not on file    Relationship status: Not on file  . Intimate partner violence:    Fear of current or ex partner: Not on file    Emotionally abused: Not on file    Physically abused: Not on file    Forced sexual activity: Not on file  Other Topics Concern  . Not  on file  Social History Narrative  . Not on file  Gertrude is single, never married. He worked previously part-time in [employer]. He has no children. He is a lifetime non-smoker. He reports no use of pipe, cigars, chewing tobacco. His alcohol intake consist of wine on "special occasions." Reports no recreational drug use.  Transfusion History: No prior transfusion  Exposure History: He has no known exposure to toxic chemicals, radiation, or pesticides.  Allergies  Allergen Reactions  . Eggs Or Egg-Derived Products Anaphylaxis  . Other Anaphylaxis    Allergic to ALL NUTS  . Peanut Oil Anaphylaxis  . Shellfish Allergy Anaphylaxis  . Coconut Oil Rash  . Peanut-Containing Drug Products   He has nonspecific seasonal allergies  Current Outpatient Medications on File Prior to Visit  Medication Sig  . acetaminophen (TYLENOL) 500 MG tablet Take 1,000 mg by mouth every 6 (six) hours as needed for moderate pain.  Marland Kitchen albuterol (PROAIR HFA) 108 (90 Base) MCG/ACT inhaler Inhale 2 puffs into the lungs every 4 (  four) hours as needed for wheezing or shortness of breath.  Marland Kitchen albuterol (PROVENTIL) (2.5 MG/3ML) 0.083% nebulizer solution Take 2.5 mg by nebulization every 6 (six) hours as needed for wheezing or shortness of breath.  . cetirizine (ZYRTEC) 10 MG tablet Take 10 mg by mouth daily.  . Ciclopirox 1 % shampoo Apply 1 Bottle topically at bedtime.  . clindamycin (CLINDAGEL) 1 % gel Apply topically 2 (two) times daily.  Marland Kitchen desonide (DESOWEN) 0.05 % ointment Apply sparingly to affected areas twice daily as needed to the face and/or neck.  Marland Kitchen dexmethylphenidate (FOCALIN XR) 20 MG 24 hr capsule Take 20 mg by mouth every morning.  Marland Kitchen dexmethylphenidate (FOCALIN) 10 MG tablet Take 10 mg by mouth daily.  Marland Kitchen docusate sodium (COLACE) 100 MG capsule Take 100 mg by mouth daily as needed for mild constipation.  Marland Kitchen EPINEPHrine 0.3 mg/0.3 mL IJ SOAJ injection Inject 0.3 mLs (0.3 mg total) into the muscle  daily as needed (allergic reaction).  . fluticasone (FLONASE) 50 MCG/ACT nasal spray Place 2 sprays into both nostrils daily.  . fluticasone (FLOVENT HFA) 110 MCG/ACT inhaler Inhale 1 puff into the lungs 2 (two) times daily. (Patient taking differently: Inhale 1 puff into the lungs daily. )  . Lactobacillus-Inulin (CULTURELLE DIGESTIVE HEALTH PO) Take 1 capsule by mouth daily.  Marland Kitchen LORazepam (ATIVAN) 0.5 MG tablet Take 0.5 mg by mouth daily as needed for anxiety.  . Melatonin 3 MG CAPS Take 3 mg by mouth at bedtime as needed (sleep).  . Omega-3 Fatty Acids (FISH OIL) 645 MG CAPS Take 640 mg by mouth 3 (three) times daily.   Bertram Gala Glycol-Propyl Glycol (SYSTANE ULTRA OP) Apply 1 drop to eye daily as needed.  . sertraline (ZOLOFT) 100 MG tablet Take 100 mg by mouth daily.  Marland Kitchen Spacer/Aero-Holding Chambers (AEROCHAMBER Z-STAT PLUS CHAMBR) MISC 1 each by Does not apply route 2 (two) times daily.  Marland Kitchen terbinafine (LAMISIL) 1 % cream Apply 1 application topically 2 (two) times daily.  Marland Kitchen triamcinolone ointment (KENALOG) 0.1 % Apply sparingly to affected areas twice daily as needed, below the face.   No current facility-administered medications on file prior to visit.     Review of Systems: Constitutional: No fever, sweats, or shaking chills. appetite fair; recent 4 pound weight deficit. Skin: No rash, scaling, sores, lumps, or jaundice; HEENT: No visual changes or hearing deficit; allergic rhinitis. Pulmonary: No unusual cough, sore throat, or orthopnea. Cardiovascular: No coronary artery disease, angina, or myocardial infarction. No cardiac dysrhythmia or hypertension.  Dyslipidemia.  Right bundle branch block. Gastrointestinal: No indigestion, dysphagia, abdominal pain. Frequent constipation and occasional diarrhea.  No nausea or vomiting.  No melena or bright red blood per rectum. Genitourinary: No urinary frequency, urgency, hematuria, or dysuria. Musculoskeletal: No arthralgias or myalgias; no  joint swelling, pain, or instability. Hematologic: No bleeding tendency or easy bruisability. Endocrine: No intolerance to hot or cold; no thyroid disease or diabetes mellitus. Vascular: No peripheral arterial or venous thromboembolic disease. Psychological: Anxiety disorder/panic attack; chronic depression; ADHD; autism. Neurological: No dizziness, lightheadedness, syncope, or near syncopal episodes; no numbness or tingling in the fingers or toes.  Physical Examination: Vital Signs: Body surface area is 1.9 meters squared.  Vitals:   06/24/18 1107  BP: 128/83  Pulse: 75  Resp: 18  Temp: (!) 97.4 F (36.3 C)  SpO2: 99%    Filed Weights   06/24/18 1107  Weight: 163 lb 11.2 oz (74.3 kg)   ECOG PERFORMANCE STATUS: 1 Constitutional:  Orville Mena is fully nourished and developed.  He looks age appropriate.  He is friendly and cooperative without respiratory compromise at rest. Skin: No rashes, scaling, dryness, jaundice, or itching. HEENT: Head is normocephalic and atraumatic.  Pupils are equal round and reactive to light and accommodation.  Sclerae are anicteric.  Conjunctivae are pink.  No sinus tenderness nor oropharyngeal lesions.  Lips without cracking or peeling; tongue without mass, inflammation, or nodularity.  Mucous membranes are moist. Neck: Supple and symmetric.  No jugular venous distention or thyromegaly.  Trachea is midline. Lymphatics: No cervical or supraclavicular lymphadenopathy.  No epitrochlear, axillary, or inguinal lymphadenopathy is appreciated. Respiratory/chest: Thorax is symmetrical.  Breath sounds are clear to auscultation and percussion.  Normal excursion and respiratory effort. Back: Symmetric without deformity or tenderness. Cardiovascular: Heart rate and rhythm are regular without murmurs. Gastrointestinal: Abdomen is soft, nontender; no organomegaly. Bowel sounds are normoactive.  No masses are appreciated. Extremities: In the lower extremities, there  is no asymmetric swelling, erythema, tenderness, or cord formation.  No clubbing, cyanosis, nor edema. Hematologic: No petechiae, hematomas, or ecchymoses. Psychological:  He is oriented to person, place, and time; anxious, mildly agitated, and loquacious. Neurological: There are no gross neurologic deficits.  Laboratory Results: June 10, 2018 Mononucleosis screen: Negative Ammonia level 57 Hepatitis A IgM: Negative  Hepatitis B surface antigen: Negative Hepatitis B core IgM antibody: Negative Hepatitis C virus antibody <0.1  June 24, 2018  Ref Range & Units 13:22 39yr ago  WBC Count 4.0 - 10.5 K/uL 4.4  6.8   RBC 4.22 - 5.81 MIL/uL 5.78  5.29   Hemoglobin 13.0 - 17.0 g/dL 57.8  46.9   HCT 62.9 - 52.0 % 49.1  44.5   MCV 80.0 - 100.0 fL 84.9  84.1 R  MCH 26.0 - 34.0 pg 29.1  29.7   MCHC 30.0 - 36.0 g/dL 52.8  41.3   RDW 24.4 - 15.5 % 12.4  13.1   Platelet Count 150 - 400 K/uL 200  172   nRBC 0.0 - 0.2 % 0.0    Neutrophils Relative % % 54    Neutro Abs 1.7 - 7.7 K/uL 2.4    Lymphocytes Relative % 33    Lymphs Abs 0.7 - 4.0 K/uL 1.5    Monocytes Relative % 7    Monocytes Absolute 0.1 - 1.0 K/uL 0.3    Eosinophils Relative % 5    Eosinophils Absolute 0.0 - 0.5 K/uL 0.2    Basophils Relative % 1    Basophils Absolute 0.0 - 0.1 K/uL 0.1    Immature Granulocytes % 0    Abs Immature Granulocytes 0.00 - 0.07 K/uL 0.01      Ref Range & Units 13:22 35yr ago  Sodium 135 - 145 mmol/L 142  140   Potassium 3.5 - 5.1 mmol/L 4.0  3.4Low    Chloride 98 - 111 mmol/L 102  104 R  CO2 22 - 32 mmol/L 30  27   Glucose, Bld 70 - 99 mg/dL 78  [id number]  R  BUN 6 - 20 mg/dL 11  20   Creatinine, Ser 0.61 - 1.24 mg/dL 3.66  4.40   Calcium 8.9 - 10.3 mg/dL 9.9  9.4   Total Protein 6.5 - 8.1 g/dL 7.8  7.3   Albumin 3.5 - 5.0 g/dL 4.5  4.2   AST 15 - 41 U/L 17  20   ALT 0 - 44 U/L 15  28 R  Alkaline Phosphatase 38 -  126 U/L 55  46   Total Bilirubin 0.3 - 1.2 mg/dL 0.9  0.9   GFR calc non Af  Amer >60 mL/min >60  >60   GFR calc Af Amer >60 mL/min >60  >60 CM  Comment: (NOTE)   Ferritin 84 Iron/TIBC 75/325 Iron saturation 23% Reticulocyte count LDH 140  Diagnostic/Imaging Studies: June 10, 2018 [facility] Radiology  CT imaging of the abdomen and pelvis with intravenous contrast revealed a normal liver.  There were no acute gallbladder findings.  The pancreas adrenal glands, and kidneys were normal.  The spleen measured 14 x 7 cm.  There were upper abdominal varices identified.  There is no evidence of small or large bowel obstruction.  There is no evidence of acute appendicitis.  No free fluid or pneumoperitoneum was identified within the peritoneal cavity.  No enlarged lymphadenopathy was seen.  There were no acute vascular abnormalities.  Within the pelvis there were no acute abnormalities or destructive osseous processes.  April 25, 2017 CT ABDOMEN AND PELVIS WITH CONTRAST  TECHNIQUE: Multidetector CT imaging of the abdomen and pelvis was performed using the standard protocol following bolus administration of intravenous contrast.  CONTRAST:  100 mL Isovue-300  COMPARISON:  None.  FINDINGS: Lower chest: The lung bases are clear.  Hepatobiliary: Mild diffuse fatty infiltration of the liver. No focal liver lesions. Gallbladder and bile ducts are unremarkable.  Pancreas: Unremarkable. No pancreatic ductal dilatation or surrounding inflammatory changes.  Spleen: Normal in size without focal abnormality.  Adrenals/Urinary Tract: Adrenal glands are unremarkable. Kidneys are normal, without renal calculi, focal lesion, or hydronephrosis. Bladder is unremarkable.  Stomach/Bowel: Stomach, small bowel, and colon are not abnormally distended. No wall thickening or inflammatory changes appreciated. Stool diffusely throughout the colon. The appendix is normal. Scattered mesenteric and right lower quadrant lymph nodes without pathologic enlargement may  be reactive or inflammatory. Can't exclude mesenteric adenitis.  Vascular/Lymphatic: No significant vascular findings are present. No enlarged abdominal or pelvic lymph nodes.  Reproductive: Prostate is unremarkable.  Other: Small periumbilical hernia containing fat. No free air or free fluid in the abdomen.  Musculoskeletal: No acute or significant osseous findings.  IMPRESSION: 1. Normal appendix. 2. Right lower quadrant lymph nodes without pathologic enlargement may indicate reactive nodes or mesenteric adenitis. 3. No evidence of bowel obstruction or inflammation. 4. Mild diffuse fatty infiltration of the liver.  Burman Nieves M.D. 04/25/2017 02:07  Summary/Assessment: In the setting of splenomegaly of unclear etiology, he presents now for further diagnostic and therapeutic recommendations.    On November 5, he suddenly disappeared and went off into the woods for most of the day.  When he was discovered by the police, he was having hallucinations and memory difficulty.  At that time he stated that he was "hurting all over and wanted to rest."  He originally presented to the [facility] emergency department on November 5 with altered mental status.  On June 10, 2018: A complete blood count shows hemoglobin 16.8 hematocrit 48.6 MCV 85 MCH 29.5 RDW 38.8 WBC 5.7 with 54% neutrophils 32% lymphocytes 6% monocytes 6% eosinophils 1% basophil; platelets 169,000. CT imaging of the head without intravenous contrast showed no acute intracranial abnormality.  Both salicylate and acetaminophen levels were low.  CT imaging of the abdomen and pelvis with intravenous contrast revealed a normal liver, pancreas, adrenal glands, and kidneys.  There is no bowel obstruction or evidence of acute appendicitis.  The spleen was enlarged measuring 14 x 7 cm. Upper  abdominal varices raised the suspicion for portal hypertension. There is diffuse fatty infiltration of the liver.   He  was also seen and followed by     Under the direction of Dr. Serita Grammes, [facility], gastroenterology an upper endoscopic evaluation was performed on November 13 which revealed a regular Z-line 40 cm from the incisors. The esophagus, cardia, fundus of the stomach, body of the stomach, antrum, duodenal bulb, 2nd part of the duodenum and ampullary region appeared normal.  Endoscopically, random biopsies were performed from the stomach and duodenum. No evidence of gastroesophageal varices. A gastric biopsy for Helicobacter pylori was obtained.  A duodenal biopsy was obtained for celiac disease. Biopsies of the stomach revealed oxyntic and antral mucosa with no significant pathologic diagnosis.  Helicobacter pylori stains were negative by H&E.  The duodenal mucosa showed no significant pathologic diagnosis. There is no villous blunting or intraepithelial lymphocytosis seen.    His appetite is relatively stable.  He has lost 4 pounds over the past several weeks.  He denies visual changes or hearing deficit.  He has no thyroid disease.  There is no cough, sore throat, orthopnea.  He denies dyspnea either at rest or on minimal exertion.  He has no pain or difficulty in swallowing.  No fever, shaking chills, sweats, or flulike symptoms are evident.  He has no heartburn or indigestion.  There is alternating diarrhea and constipation with a tendency toward constipation since childhood.  He has no viral hepatitis, inflammatory bowel disease, or known diverticulosis.  He denies melena or bright red blood per rectum.  There is no urinary frequency, urgency, hematuria, dysuria.  He has no swelling of his ankles.  There are no new arthralgias or myalgias.  He has no bleeding tendency or easy bruisability.  He has no prior history of peripheral arterial or venous thromboembolic disease.    His other comorbid problems include attention deficit hyperactivity disorder; autism; allergic rhinitis; anxiety disorder/panic  attacks; juvenile idiopathic scoliosis; bronchial asthma; hyperlipidemia; fatty liver; right bundle branch block; allergic eczema; and chronic constipation.  He is currently living with his parents.  [facility].    Recommendation/Plan: The results of his laboratory studies were not available at the time of discharge.  Some of those results are detailed above.  Although his white blood cell and platelet count were normal, his hemoglobin/hematocrit were slightly elevated.  He is a never smoker.  A review of his imaging studies from November 5 suggest a modestly increased spleen size.  The splenic volume was not measured.  He has no cytopenias.  Although there are abdominal varices, there was no suggestion of gastric varices. In addition there is no evidence on CT imaging of pathologic lymphadenopathy to suggest a lymphoproliferative disease.  A beta-2 microglobulin was requested.  Taking a rather conservative approach initially, laboratory studies were obtained to exclude an underlying hyperferritinemia, hemolytic process, metabolic anomaly, connective tissue disorder, or myeloproliferative process.  Those results are pending.    Barring any unforeseen complications, his next scheduled doctor visit to discuss those results are on July 10, 2018.  The total time spent discussing the previous laboratory studies, CT imaging, methodology for evaluating splenomegaly of unclear etiology, preliminary considerations with recommendations was 60 minutes.  At least 50% of that time was spent in discussion, counseling, and answering questions. All questions were answered to his satisfaction.   This note was dictated using voice activated technology/software.  Unfortunately, typographical  errors are not uncommon, and transcription is subject to mistakes and regrettably misinterpretation.  If necessary, clarification of the above information can be  discussed with me at any time.  Thank you Dr. Kateri PlummerMorrow for allowing my participation in the care of Christen ButterJeffrey Bhullar. I will keep you closely informed as the results of his preliminary laboratory data become available.  Please do not hesitate to call should any questions arise regarding this initial consultation and discussion.  FOLLOW UP: AS DIRECTED   cc:                   Farris HasAaron Morrow, MD                         Serita GrammesLaura Patwa MD   Toni Arthursichard H Ayron Fillinger, MD  Hematology/Oncology [facility] [address] [place], [place] [address] Office: [phone] Main: [phone]

## 2018-06-25 LAB — PATHOLOGIST SMEAR REVIEW

## 2018-06-25 LAB — BETA 2 MICROGLOBULIN, SERUM: Beta-2 Microglobulin: 1.8 mg/L (ref 0.6–2.4)

## 2018-06-25 LAB — ANTINUCLEAR ANTIBODIES, IFA: ANA Ab, IFA: NEGATIVE

## 2018-06-25 LAB — ERYTHROPOIETIN: Erythropoietin: 8.8 m[IU]/mL (ref 2.6–18.5)

## 2018-06-25 LAB — HAPTOGLOBIN: HAPTOGLOBIN: 32 mg/dL — AB (ref 34–200)

## 2018-06-30 DIAGNOSIS — R4182 Altered mental status, unspecified: Secondary | ICD-10-CM | POA: Diagnosis not present

## 2018-06-30 DIAGNOSIS — R161 Splenomegaly, not elsewhere classified: Secondary | ICD-10-CM | POA: Diagnosis not present

## 2018-06-30 DIAGNOSIS — H10413 Chronic giant papillary conjunctivitis, bilateral: Secondary | ICD-10-CM | POA: Diagnosis not present

## 2018-06-30 DIAGNOSIS — K5900 Constipation, unspecified: Secondary | ICD-10-CM | POA: Diagnosis not present

## 2018-06-30 DIAGNOSIS — H5213 Myopia, bilateral: Secondary | ICD-10-CM | POA: Diagnosis not present

## 2018-06-30 DIAGNOSIS — E785 Hyperlipidemia, unspecified: Secondary | ICD-10-CM | POA: Diagnosis not present

## 2018-06-30 DIAGNOSIS — Z131 Encounter for screening for diabetes mellitus: Secondary | ICD-10-CM | POA: Diagnosis not present

## 2018-06-30 DIAGNOSIS — I8500 Esophageal varices without bleeding: Secondary | ICD-10-CM | POA: Diagnosis not present

## 2018-06-30 DIAGNOSIS — Z0000 Encounter for general adult medical examination without abnormal findings: Secondary | ICD-10-CM | POA: Diagnosis not present

## 2018-06-30 DIAGNOSIS — F411 Generalized anxiety disorder: Secondary | ICD-10-CM | POA: Diagnosis not present

## 2018-07-08 DIAGNOSIS — K760 Fatty (change of) liver, not elsewhere classified: Secondary | ICD-10-CM | POA: Diagnosis not present

## 2018-07-11 ENCOUNTER — Inpatient Hospital Stay: Payer: Medicare Other | Attending: Hematology and Oncology | Admitting: Hematology and Oncology

## 2018-07-11 VITALS — BP 136/99 | HR 62 | Temp 97.9°F | Resp 18 | Ht 69.0 in | Wt 163.9 lb

## 2018-07-11 DIAGNOSIS — F840 Autistic disorder: Secondary | ICD-10-CM | POA: Diagnosis not present

## 2018-07-11 DIAGNOSIS — R161 Splenomegaly, not elsewhere classified: Secondary | ICD-10-CM | POA: Diagnosis not present

## 2018-07-11 DIAGNOSIS — I4510 Unspecified right bundle-branch block: Secondary | ICD-10-CM | POA: Diagnosis not present

## 2018-07-11 DIAGNOSIS — K5900 Constipation, unspecified: Secondary | ICD-10-CM | POA: Diagnosis not present

## 2018-07-11 DIAGNOSIS — F909 Attention-deficit hyperactivity disorder, unspecified type: Secondary | ICD-10-CM | POA: Diagnosis not present

## 2018-07-11 DIAGNOSIS — E785 Hyperlipidemia, unspecified: Secondary | ICD-10-CM | POA: Diagnosis not present

## 2018-07-11 DIAGNOSIS — Z79899 Other long term (current) drug therapy: Secondary | ICD-10-CM | POA: Insufficient documentation

## 2018-07-11 DIAGNOSIS — K760 Fatty (change of) liver, not elsewhere classified: Secondary | ICD-10-CM | POA: Diagnosis not present

## 2018-07-11 DIAGNOSIS — M41119 Juvenile idiopathic scoliosis, site unspecified: Secondary | ICD-10-CM | POA: Diagnosis not present

## 2018-07-11 DIAGNOSIS — F419 Anxiety disorder, unspecified: Secondary | ICD-10-CM | POA: Insufficient documentation

## 2018-07-13 ENCOUNTER — Encounter: Payer: Self-pay | Admitting: Hematology and Oncology

## 2018-07-13 NOTE — Progress Notes (Signed)
Outpatient Progress Note Hematology/Oncology  Patient Name:  Francisco Cisneros  DOB: Aug 04, 1986   Date of Service: December 6 , 2019  Referring Provider: Farris Has, MD [address] [place], [place] [address]   Consulting Physician: Toni Arthurs, MD Hematology/Oncology  Reason for Visit: In the setting of previously described splenomegaly, Francisco Cisneros presents now for the results of his preliminary lab work and recommendations.  Brief History: Francisco Cisneros is a 32 year old resident of [place] whose past medical history is significant for attention deficit hyperactivity disorder; autism; allergic rhinitis; anxiety disorder/panic attacks; juvenile idiopathic scoliosis; bronchial asthma; hyperlipidemia; fatty liver; right bundle branch block; allergic eczema; and chronic constipation.  Francisco Cisneros is currently living with his parents.  Francisco Cisneros [facility] with [facility].  On November 5, Francisco Cisneros suddenly disappeared and went off into the woods for most of the day.  When Francisco Cisneros was discovered by the police, Francisco Cisneros was having hallucinations and memory difficulty.  At that time Francisco Cisneros stated that Francisco Cisneros was "hurting all over and wanted to rest."  Francisco Cisneros originally presented to the [facility] emergency department on November 5 with altered mental status.  On June 10, 2018: A complete blood count showed hemoglobin 16.8 hematocrit 48.6 MCV 85 MCH 29.5 RDW 38.8 WBC 5.7 with 54% neutrophils 32% lymphocytes 6% monocytes 6% eosinophils 1% basophil; platelets 169,000. CT imaging of the head without intravenous contrast showed no acute intracranial abnormality.  Both salicylate and acetaminophen levels were low.  CT imaging of the abdomen and pelvis with intravenous contrast revealed a normal liver, pancreas, adrenal glands, and kidneys.  There is no bowel obstruction or evidence of acute appendicitis. The spleen was enlarged measuring 14 x 7 cm. Upper abdominal  varices raised the suspicion for portal hypertension.  His primary care physician is Dr. Farris Has.  Francisco Cisneros was also seen and followed by Dr. Serita Grammes, [facility], gastroenterology.  On November 13, an upper endoscopic evaluation was performed.  Those results revealed a regular Z-line 40 cm from the incisors. The esophagus, cardia, fundus of the stomach, body of the stomach, antrum, duodenal bulb, 2nd part of the duodenum and ampullary region appeared normal.  Endoscopically, random biopsies were performed from the stomach and duodenum. No evidence of gastroesophageal varices. A gastric biopsy for Helicobacter pylori was obtained.  A duodenal biopsy was obtained for celiac disease. Biopsies of the stomach revealed oxyntic and antral mucosa with no significant pathologic diagnosis.  Helicobacter pylori stains were negative by H&E.  The duodenal mucosa showed no significant pathologic diagnosis. There is no villous blunting or intraepithelial lymphocytosis seen. It is with this background Francisco Cisneros presents now for the results of his preliminary lab work and recommendations.  Interval History: In the interim since his last visit, Francisco Cisneros reports no new problems or complaints.  Francisco Cisneros is on no new medications.  His appetite is relatively stable. Francisco Cisneros has lost 4 pounds over the past several weeks, now stable.  Francisco Cisneros has no epistaxis or hemoptysis.  Francisco Cisneros has no unusual headaches, dizziness, lightheadedness, syncope, or near syncopal episodes. There is no rash or itching.  Francisco Cisneros denies visual changes or hearing deficit.  Francisco Cisneros has no thyroid disease. There is no cough, sore throat, orthopnea. Francisco Cisneros denies dyspnea either at rest or on minimal exertion.  Francisco Cisneros has no pain or difficulty in swallowing.  No fever, shaking chills, sweats, or flulike symptoms are evident.  Francisco Cisneros has no heartburn or indigestion. There is alternating diarrhea and  constipation with a tendency toward constipation since childhood. Francisco Cisneros denies melena or bright red blood per  rectum.  There is no urinary frequency, urgency, hematuria, dysuria.  Francisco Cisneros has no swelling of his ankles. There are no new arthralgias or myalgias.  Francisco Cisneros has no bleeding tendency or easy bruisability.  Francisco Cisneros has no prior history of peripheral arterial or venous thromboembolic disease.   Past Medical History:  Diagnosis Date  . ADHD (attention deficit hyperactivity disorder)   . Asthma   . Autistic disorder   . Eczema    Past Surgical History:  Procedure Laterality Date  . WISDOM TOOTH EXTRACTION  2009   Allergies  Allergen Reactions  . Eggs Or Egg-Derived Products Anaphylaxis  . Other Anaphylaxis    Allergic to ALL NUTS  . Peanut Oil Anaphylaxis  . Shellfish Allergy Anaphylaxis  . Coconut Oil Rash  . Peanut-Containing Drug Products   Francisco Cisneros has nonspecific seasonal allergies  Current Outpatient Medications on File Prior to Visit  Medication Sig  . acetaminophen (TYLENOL) 500 MG tablet Take 1,000 mg by mouth every 6 (six) hours as needed for moderate pain.  Marland Kitchen albuterol (PROAIR HFA) 108 (90 Base) MCG/ACT inhaler Inhale 2 puffs into the lungs every 4 (four) hours as needed for wheezing or shortness of breath.  Marland Kitchen albuterol (PROVENTIL) (2.5 MG/3ML) 0.083% nebulizer solution Take 2.5 mg by nebulization every 6 (six) hours as needed for wheezing or shortness of breath.  . cetirizine (ZYRTEC) 10 MG tablet Take 10 mg by mouth daily.  . Ciclopirox 1 % shampoo Apply 1 Bottle topically at bedtime.  . clindamycin (CLINDAGEL) 1 % gel Apply topically 2 (two) times daily.  Marland Kitchen desonide (DESOWEN) 0.05 % ointment Apply sparingly to affected areas twice daily as needed to the face and/or neck.  Marland Kitchen dexmethylphenidate (FOCALIN XR) 20 MG 24 hr capsule Take 20 mg by mouth every morning.  Marland Kitchen dexmethylphenidate (FOCALIN) 10 MG tablet Take 10 mg by mouth daily.  Marland Kitchen docusate sodium (COLACE) 100 MG capsule Take 100 mg by mouth daily as needed for mild constipation.  Marland Kitchen EPINEPHrine 0.3 mg/0.3 mL IJ SOAJ injection Inject 0.3  mLs (0.3 mg total) into the muscle daily as needed (allergic reaction).  . fluticasone (FLONASE) 50 MCG/ACT nasal spray Place 2 sprays into both nostrils daily.  . fluticasone (FLOVENT HFA) 110 MCG/ACT inhaler Inhale 1 puff into the lungs 2 (two) times daily. (Patient taking differently: Inhale 1 puff into the lungs daily. )  . Lactobacillus-Inulin (CULTURELLE DIGESTIVE HEALTH PO) Take 1 capsule by mouth daily.  Marland Kitchen LORazepam (ATIVAN) 0.5 MG tablet Take 0.5 mg by mouth daily as needed for anxiety.  . Melatonin 3 MG CAPS Take 3 mg by mouth at bedtime as needed (sleep).  . Omega-3 Fatty Acids (FISH OIL) 645 MG CAPS Take 640 mg by mouth 3 (three) times daily.   Bertram Gala Glycol-Propyl Glycol (SYSTANE ULTRA OP) Apply 1 drop to eye daily as needed.  . sertraline (ZOLOFT) 100 MG tablet Take 100 mg by mouth daily.  Marland Kitchen Spacer/Aero-Holding Chambers (AEROCHAMBER Z-STAT PLUS CHAMBR) MISC 1 each by Does not apply route 2 (two) times daily.  Marland Kitchen terbinafine (LAMISIL) 1 % cream Apply 1 application topically 2 (two) times daily.  Marland Kitchen triamcinolone ointment (KENALOG) 0.1 % Apply sparingly to affected areas twice daily as needed, below the face.   No current facility-administered medications on file prior to visit.     Review of Systems: Constitutional: No fever, sweats, or shaking chills. appetite fair; recent  4 pound weight deficit. Skin: No rash, scaling, sores, lumps, or jaundice; HEENT: No visual changes or hearing deficit; allergic rhinitis. Pulmonary: No unusual cough, sore throat, or orthopnea. Cardiovascular: No coronary artery disease, angina, or myocardial infarction. No cardiac dysrhythmia or hypertension.  Dyslipidemia.  Right bundle branch block. Gastrointestinal: No indigestion, dysphagia, abdominal pain. Frequent constipation and occasional diarrhea.  No nausea or vomiting.  No melena or bright red blood per rectum. Genitourinary: No urinary frequency, urgency, hematuria, or  dysuria. Musculoskeletal: No arthralgias or myalgias; no joint swelling, pain, or instability. Hematologic: No bleeding tendency or easy bruisability. Endocrine: No intolerance to hot or cold; no thyroid disease or diabetes mellitus. Vascular: No peripheral arterial or venous thromboembolic disease. Psychological: Anxiety disorder/panic attack; chronic depression; ADHD; autism. Neurological: No dizziness, lightheadedness, syncope, or near syncopal episodes; no numbness or tingling in the fingers or toes.  Physical Examination: Vital Signs: Body surface area is 1.9 meters squared.  Vitals:   07/11/18 1634  BP: (!) 136/99  Pulse: 62  Resp: 18  Temp: 97.9 F (36.6 C)  SpO2: 99%    Filed Weights   07/11/18 1634  Weight: 163 lb 14.4 oz (74.3 kg)  ECOG PERFORMANCE STATUS: 1 Constitutional:  Francisco Cisneros is fully nourished and developed.  Francisco Cisneros looks age appropriate.  Francisco Cisneros is friendly and cooperative without respiratory compromise at rest. Skin: No rashes, scaling, dryness, jaundice, or itching. HEENT: Head is normocephalic and atraumatic.  Pupils are equal round and reactive to light and accommodation.  Sclerae are anicteric.  Conjunctivae are pink.  No sinus tenderness nor oropharyngeal lesions.  Lips without cracking or peeling; tongue without mass, inflammation, or nodularity.  Mucous membranes are moist. Neck: Supple and symmetric.  No jugular venous distention or thyromegaly.  Trachea is midline. Lymphatics: No cervical or supraclavicular lymphadenopathy.  No epitrochlear, axillary, or inguinal lymphadenopathy is appreciated. Respiratory/chest: Thorax is symmetrical.  Breath sounds are clear to auscultation and percussion.  Normal excursion and respiratory effort. Back: Symmetric without deformity or tenderness. Cardiovascular: Heart rate and rhythm are regular without murmurs. Gastrointestinal: Abdomen is soft, nontender; no organomegaly. Bowel sounds are normoactive.  No masses are  appreciated. Extremities: In the lower extremities, there is no asymmetric swelling, erythema, tenderness, or cord formation.  No clubbing, cyanosis, nor edema. Hematologic: No petechiae, hematomas, or ecchymoses. Psychological:  Francisco Cisneros is oriented to person, place, and time; anxious, mildly agitated, and loquacious. Neurological: There are no gross neurologic deficits.  Laboratory Results: June 24, 2018  Ref Range & Units 2wk ago 11yr ago  WBC Count 4.0 - 10.5 K/uL 4.4  6.8   RBC 4.22 - 5.81 MIL/uL 5.78  5.29   Hemoglobin 13.0 - 17.0 g/dL 53.6  64.4   HCT 03.4 - 52.0 % 49.1  44.5   MCV 80.0 - 100.0 fL 84.9  84.1 R  MCH 26.0 - 34.0 pg 29.1  29.7   MCHC 30.0 - 36.0 g/dL 74.2  59.5   RDW 63.8 - 15.5 % 12.4  13.1   Platelet Count 150 - 400 K/uL 200  172   nRBC 0.0 - 0.2 % 0.0    Neutrophils Relative % % 54    Neutro Abs 1.7 - 7.7 K/uL 2.4    Lymphocytes Relative % 33    Lymphs Abs 0.7 - 4.0 K/uL 1.5    Monocytes Relative % 7    Monocytes Absolute 0.1 - 1.0 K/uL 0.3    Eosinophils Relative % 5    Eosinophils Absolute 0.0 -  0.5 K/uL 0.2    Basophils Relative % 1    Basophils Absolute 0.0 - 0.1 K/uL 0.1    Immature Granulocytes % 0    Abs Immature Granulocytes 0.00 - 0.07 K/uL 0.01      Ref Range & Units 2wk ago 3956yr ago  Sodium 135 - 145 mmol/L 142  140   Potassium 3.5 - 5.1 mmol/L 4.0  3.4Low    Chloride 98 - 111 mmol/L 102  104 R  CO2 22 - 32 mmol/L 30  27   Glucose, Bld 70 - 99 mg/dL 78  [id number]  R  BUN 6 - 20 mg/dL 11  20   Creatinine, Ser 0.61 - 1.24 mg/dL 4.540.92  0.980.76   Calcium 8.9 - 10.3 mg/dL 9.9  9.4   Total Protein 6.5 - 8.1 g/dL 7.8  7.3   Albumin 3.5 - 5.0 g/dL 4.5  4.2   AST 15 - 41 U/L 17  20   ALT 0 - 44 U/L 15  28 R  Alkaline Phosphatase 38 - 126 U/L 55  46   Total Bilirubin 0.3 - 1.2 mg/dL 0.9  0.9   GFR calc non Af Amer >60 mL/min >60  >60   GFR calc Af Amer >60 mL/min >60  >60 CM  LDH 140 Haptoglobin 32 (18 - 40 years: 17 - 317) Beta-2 microglobulin  1.8 ANA: Negative Erythropoietin 8.8 JAK2 V617F, CALR, MPL: Not identified Ferritin 84 Iron/TIBC 75/325 Iron saturation 23% Reticulocyte count  June 10, 2018 Mononucleosis screen: Negative Ammonia level 57 Hepatitis A IgM: Negative  Hepatitis B surface antigen: Negative Hepatitis B core IgM antibody: Negative Hepatitis C virus antibody <0.1  Diagnostic/Imaging Studies: June 10, 2018 [facility]  CT imaging of the abdomen and pelvis with intravenous contrast revealed a normal liver.  There were no acute gallbladder findings.  The pancreas adrenal glands, and kidneys were normal.  The spleen measured 14 x 7 cm.  There were upper abdominal varices identified.  There is no evidence of small or large bowel obstruction.  There is no evidence of acute appendicitis.  No free fluid or pneumoperitoneum was identified within the peritoneal cavity.  No enlarged lymphadenopathy was seen.  There were no acute vascular abnormalities.  Within the pelvis there were no acute abnormalities or destructive osseous processes.  April 25, 2017 CT ABDOMEN AND PELVIS WITH CONTRAST  TECHNIQUE: Multidetector CT imaging of the abdomen and pelvis was performed using the standard protocol following bolus administration of intravenous contrast.  CONTRAST:  100 mL Isovue-300  COMPARISON:  None.  FINDINGS: Lower chest: The lung bases are clear.  Hepatobiliary: Mild diffuse fatty infiltration of the liver. No focal liver lesions. Gallbladder and bile ducts are unremarkable.  Pancreas: Unremarkable. No pancreatic ductal dilatation or surrounding inflammatory changes.  Spleen: Normal in size without focal abnormality.  Adrenals/Urinary Tract: Adrenal glands are unremarkable. Kidneys are normal, without renal calculi, focal lesion, or hydronephrosis. Bladder is unremarkable.  Stomach/Bowel: Stomach, small bowel, and colon are not abnormally distended. No wall thickening or  inflammatory changes appreciated. Stool diffusely throughout the colon. The appendix is normal. Scattered mesenteric and right lower quadrant lymph nodes without pathologic enlargement may be reactive or inflammatory. Can't exclude mesenteric adenitis.  Vascular/Lymphatic: No significant vascular findings are present. No enlarged abdominal or pelvic lymph nodes.  Reproductive: Prostate is unremarkable.  Other: Small periumbilical hernia containing fat. No free air or free fluid in the abdomen.  Musculoskeletal: No acute or significant osseous findings.  IMPRESSION: 1. Normal appendix. 2. Right lower quadrant lymph nodes without pathologic enlargement may indicate reactive nodes or mesenteric adenitis. 3. No evidence of bowel obstruction or inflammation. 4. Mild diffuse fatty infiltration of the liver.  Burman Nieves M.D. 04/25/2017 02:07  Summary/Assessment: In the setting of previously described splenomegaly, Francisco Cisneros presents now for the results of his preliminary lab work and recommendations.  On June 10, 2018: A complete blood count showed hemoglobin 16.8 hematocrit 48.6 MCV 85 MCH 29.5 RDW 38.8 WBC 5.7 with 54% neutrophils 32% lymphocytes 6% monocytes 6% eosinophils 1% basophil; platelets 169,000. CT imaging of the head without intravenous contrast showed no acute intracranial abnormality.  Both salicylate and acetaminophen levels were low.  CT imaging of the abdomen and pelvis with intravenous contrast revealed a normal liver, pancreas, adrenal glands, and kidneys.  There is no bowel obstruction or evidence of acute appendicitis. The spleen was enlarged measuring 14 x 7 cm. Upper abdominal varices raised the suspicion for portal hypertension.  His primary care physician is Dr. Farris Has.  Francisco Cisneros was also seen and followed by Dr. Serita Grammes, [facility], gastroenterology.  On November 13, an upper endoscopic evaluation was performed.  Those results revealed a regular  Z-line 40 cm from the incisors.The esophagus, cardia, fundus of the stomach, body of the stomach, antrum, duodenal bulb, 2nd part of the duodenum and ampullary region appeared normal.  Endoscopically, random biopsies were performed from the stomach and duodenum. No evidence of gastroesophageal varices. A gastric biopsy for Helicobacter pylori was obtained.  A duodenal biopsy was obtained for celiac disease. Biopsies of the stomach revealed oxyntic and antral mucosa with no significant pathologic diagnosis.  Helicobacter pylori stains were negative by H&E.  The duodenal mucosa showed no significant pathologic diagnosis. There is no villous blunting or intraepithelial lymphocytosis seen.   In the interim since his last visit, Francisco Cisneros reports no new problems or complaints.  Francisco Cisneros is on no new medications.  His appetite is relatively stable. Francisco Cisneros has lost 4 pounds over the past several weeks, now stable.  Francisco Cisneros has no epistaxis or hemoptysis.  Francisco Cisneros has no unusual headaches, dizziness, lightheadedness, syncope, or near syncopal episodes. There is no rash or itching.  Francisco Cisneros denies visual changes or hearing deficit.  Francisco Cisneros has no thyroid disease. There is no cough, sore throat, orthopnea. Francisco Cisneros denies dyspnea either at rest or on minimal exertion.  Francisco Cisneros has no pain or difficulty in swallowing.  No fever, shaking chills, sweats, or flulike symptoms are evident.  Francisco Cisneros has no heartburn or indigestion. There is alternating diarrhea and constipation with a tendency toward constipation since childhood. Francisco Cisneros denies melena or bright red blood per rectum.  There is no urinary frequency, urgency, hematuria, dysuria.  Francisco Cisneros has no swelling of his ankles. There are no new arthralgias or myalgias.  Francisco Cisneros has no bleeding tendency or easy bruisability.  Francisco Cisneros has no prior history of peripheral arterial or venous thromboembolic disease.   His other comorbid problems include attention deficit hyperactivity disorder; autism; allergic rhinitis; anxiety disorder/panic  attacks; juvenile idiopathic scoliosis; bronchial asthma; hyperlipidemia; fatty liver; right bundle branch block; allergic eczema; and chronic constipation.  Francisco Cisneros is currently living with his parents.  Francisco Cisneros [facility].    Recommendation/Plan: The results of his laboratory studies from November 19 were reviewed and discussed in detail.  Those results are outlined above.  A review of his imaging studies from November 5 suggested a modestly increased  spleen size. The splenic volume was not measured.  Francisco Cisneros has no cytopenias.  After reviewing personally the imaging studies from [facility], Dr. Serita Grammes, with Francisco Cisneros parents, confirmed with the radiology reviewer that there were no abdominal varices and that the spleen was on the upper limits of normal in size.  In addition there is no evidence on CT imaging of pathologic lymphadenopathy to suggest a lymphoproliferative disease.    Taking a rather conservative approach initially, laboratory studies failed to reveal any evidence of hyperferritinemia, hemolytic process, metabolic anomaly, connective tissue disorder, or myeloproliferative process.  Although I recommended a follow-up visit just to confirm stability, they declined.  Therefore Francisco Cisneros is discharged to the care of Drs. Farris Has and Serita Grammes, gastroenterology.  Please do not hesitate to reconsult should any new problems arise.  This note was dictated using voice activated technology/software.  Unfortunately, typographical errors are not uncommon, and transcription is subject to mistakes and regrettably misinterpretation.  If necessary, clarification of the above information can be discussed with me at any time.  FOLLOW UP: AS DIRECTED   cc:                   Farris Has, MD                         Serita Grammes MD   Toni Arthurs, MD  Hematology/Oncology [facility] [address] [place], [place]  [address] Office: [phone] Main: [phone]

## 2018-07-14 ENCOUNTER — Telehealth: Payer: Self-pay | Admitting: Hematology and Oncology

## 2018-07-14 NOTE — Telephone Encounter (Signed)
Per 12/06 no los

## 2018-07-15 LAB — JAK2 (INCLUDING V617F AND EXON 12), MPL,& CALR-NEXT GEN SEQ

## 2018-07-16 DIAGNOSIS — Z23 Encounter for immunization: Secondary | ICD-10-CM | POA: Diagnosis not present

## 2018-07-17 ENCOUNTER — Ambulatory Visit: Payer: Self-pay | Admitting: Family Medicine

## 2018-07-23 ENCOUNTER — Encounter: Payer: Self-pay | Admitting: Family Medicine

## 2018-07-23 ENCOUNTER — Ambulatory Visit (INDEPENDENT_AMBULATORY_CARE_PROVIDER_SITE_OTHER): Payer: Medicare Other | Admitting: Family Medicine

## 2018-07-23 VITALS — BP 114/74 | HR 85 | Temp 98.2°F | Resp 12

## 2018-07-23 DIAGNOSIS — L2089 Other atopic dermatitis: Secondary | ICD-10-CM

## 2018-07-23 DIAGNOSIS — J3089 Other allergic rhinitis: Secondary | ICD-10-CM | POA: Diagnosis not present

## 2018-07-23 DIAGNOSIS — T7800XD Anaphylactic reaction due to unspecified food, subsequent encounter: Secondary | ICD-10-CM

## 2018-07-23 DIAGNOSIS — J4520 Mild intermittent asthma, uncomplicated: Secondary | ICD-10-CM | POA: Diagnosis not present

## 2018-07-23 NOTE — Patient Instructions (Addendum)
Asthma Continue albuterol 2 puffs every 4 hours as needed for cough or wheeze.  You may use your albuterol inhaler 5-15 minutes before exercise to decrease cough or wheeze For asthma flares begin Flovent 110-2 puffs twice a day with a spacer for 2 weeks or until cough and wheeze free  Allergic rhinitis Continue cetirizine 10 mg once a day as needed for a runny nose Flonase 1-2 sprays in each nostril once a day as needed for a stuffy nose Consider nasal rinses as needed for nasal symptoms as needed  Atopic dermatitis Continue a daily moisturizing routine Apply triamcinolone 0.1% ointment to red itchy areas below the face twice a day as needed  Anaphylactic shock due to food Continue to avoid peanut, tree nuts, egg, and shellfish. In case of an allergic reaction, give Benadryl 50 mg every 4 hours, and if life-threatening symptoms occur, inject with EpiPen 0.3 mg.  Call us if this treatment plan is not working well for you  Follow up in 4 months or sooner if needed

## 2018-07-23 NOTE — Progress Notes (Addendum)
[address] [place] [id number] Dept: [phone]  FOLLOW UP NOTE  Patient ID: Francisco Cisneros, male    DOB: Feb 20, 1986  Age: 32 y.o. MRN: [id number] Date of Office Visit: 07/23/2018  Assessment  Chief Complaint: Allergic Rhinitis  and Asthma  HPI Francisco Cisneros is a 32 year old male who presents to the clinic for a follow up visit. He is accompanied by his father who assists with history. He was last seen in this clinic on 06/11/2018 by Dr. Nunzio CobbsBobbitt for evaluation of asthma, allergic rhinitis, atopic dermatitis, and food allergy. At today's visit, he reports his asthma has been well controlled with no shortness of breath, cough or wheeze with activity or rest. He has been using Flovent 1 puff once a day about 5-6 days a week and has not needed his albuterol inhaler since the last visit to this office. Allergic rhinitis is reported as well controlled with no medical intervention. Atopic dermatitis is well controlled with a daily moisturizer. He continues to avoid peanut, tree nuts, shellfish, and eggs. He has not had any accidental ingestions nor has he needed to use his EpiPen. His current medications are listed in the chart.   Drug Allergies:  Allergies  Allergen Reactions  . Eggs Or Egg-Derived Products Anaphylaxis  . Other Anaphylaxis    Allergic to ALL NUTS  . Peanut Oil Anaphylaxis  . Shellfish Allergy Anaphylaxis  . Coconut Oil Rash  . Haloperidol Other (See Comments)    Drowsy for days  . Peanut-Containing Drug Products     Physical Exam: BP 114/74 (BP Location: Left Arm, Patient Position: Sitting, Cuff Size: Normal)   Pulse 85   Temp 98.2 F (36.8 C) (Oral)   Resp 12   SpO2 98%    Physical Exam Constitutional:      Appearance: Normal appearance.  HENT:     Head: Normocephalic and atraumatic.     Right Ear: Tympanic membrane normal.     Left Ear: Tympanic membrane normal.     Nose: Nose normal.     Comments: Bilateral nares normal. Ears normal. Eyes  normal. Pharynx normal.    Mouth/Throat:     Pharynx: Oropharynx is clear.  Eyes:     Conjunctiva/sclera: Conjunctivae normal.  Neck:     Musculoskeletal: Normal range of motion and neck supple.  Cardiovascular:     Rate and Rhythm: Normal rate and regular rhythm.     Heart sounds: Normal heart sounds.  Pulmonary:     Effort: Pulmonary effort is normal.     Breath sounds: Normal breath sounds.     Comments: Lungs clear to auscultation Musculoskeletal: Normal range of motion.  Skin:    General: Skin is warm and dry.     Comments: No rash noted  Neurological:     Mental Status: He is alert and oriented to person, place, and time. Mental status is at baseline.  Psychiatric:        Mood and Affect: Mood normal.        Behavior: Behavior normal.        Thought Content: Thought content normal.        Judgment: Judgment normal.     Diagnostics: FVC 4.62, FEV1 3.83. Predicted FVC 5.29, predicted FEV1 4.33. Spirometry is within the normal range.   Assessment and Plan: 1. Mild intermittent asthma without complication   2. Other allergic rhinitis   3. Other atopic dermatitis   4. Anaphylactic shock due to food, subsequent encounter  Patient Instructions  Asthma Continue albuterol 2 puffs every 4 hours as needed for cough or wheeze.  You may use your albuterol inhaler 5-15 minutes before exercise to decrease cough or wheeze For asthma flares begin Flovent 110-2 puffs twice a day with a spacer for 2 weeks or until cough and wheeze free  Allergic rhinitis Continue cetirizine 10 mg once a day as needed for a runny nose Flonase 1-2 sprays in each nostril once a day as needed for a stuffy nose Consider nasal rinses as needed for nasal symptoms as needed  Atopic dermatitis Continue a daily moisturizing routine Apply triamcinolone 0.1% ointment to red itchy areas below the face twice a day as needed  Anaphylactic shock due to food Continue to avoid peanut, tree nuts, egg, and  shellfish. In case of an allergic reaction, give Benadryl 50 mg every 4 hours, and if life-threatening symptoms occur, inject with EpiPen 0.3 mg.  Call us if this treatment plan is not working well for you  Follow up in 4 months or sooner if needed   Return in about 4 months (around 11/22/2018), or if symptoms worsen or fail to improve.    Thank you for the opportunity to care for this patient.  Please do not hesitate to contact me with questions.  Thermon Leyland, FNP [facility]  I have provided oversight concerning Aura Camps, FNP evaluation and treatment of this patient's heath issues addressed during today's encounter. I agree with the assessment and plan as outlined in the note above.  Wyline Mood, DO [facility]

## 2018-07-25 ENCOUNTER — Telehealth: Payer: Self-pay | Admitting: *Deleted

## 2018-07-25 ENCOUNTER — Telehealth: Payer: Self-pay | Admitting: Hematology and Oncology

## 2018-07-25 NOTE — Telephone Encounter (Signed)
"  Francisco Cisneros mother an guardian [name] calling to obtain last month's Jak2 lab results.  Read name of test, resulted 07-15-2018 with no results.  I want to know results today.  Dr. Caron Presumeuben said he would put notes with the results.  At this time we did not select one of the doctors Dr. Caron Presumeuben named.  Continuing with primary Dr. Satira SarkAron Morrow and Dr. Serita GrammesLaura Patwa for now.  I would like results today.  No need for appointment to obtain results.  What can be done so I can print results as usual from the portal?  Dr. 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  Reviewed 07-11-2018 visit recommended plan.  Jak2 Genetic tests is sent to outside laboratory not with [facility].  Outside test results are scanned.  Health Information Management is best to assist with record request.     "At this time we'll remain with his main doctors."  Routed results to 'Main' providers.

## 2018-07-25 NOTE — Telephone Encounter (Signed)
Printed medical records for mother request, release [id number]

## 2018-07-28 DIAGNOSIS — K760 Fatty (change of) liver, not elsewhere classified: Secondary | ICD-10-CM | POA: Diagnosis not present

## 2018-08-14 DIAGNOSIS — K760 Fatty (change of) liver, not elsewhere classified: Secondary | ICD-10-CM | POA: Diagnosis not present

## 2018-08-14 DIAGNOSIS — R9389 Abnormal findings on diagnostic imaging of other specified body structures: Secondary | ICD-10-CM | POA: Diagnosis not present

## 2018-09-30 DIAGNOSIS — F419 Anxiety disorder, unspecified: Secondary | ICD-10-CM | POA: Diagnosis not present

## 2018-10-14 DIAGNOSIS — F840 Autistic disorder: Secondary | ICD-10-CM | POA: Diagnosis not present

## 2018-10-14 DIAGNOSIS — R161 Splenomegaly, not elsewhere classified: Secondary | ICD-10-CM | POA: Diagnosis not present

## 2018-10-14 DIAGNOSIS — K760 Fatty (change of) liver, not elsewhere classified: Secondary | ICD-10-CM | POA: Diagnosis not present

## 2018-11-17 ENCOUNTER — Ambulatory Visit: Payer: Self-pay | Admitting: Allergy and Immunology

## 2018-11-17 ENCOUNTER — Encounter: Payer: Self-pay | Admitting: Allergy and Immunology

## 2018-11-17 ENCOUNTER — Ambulatory Visit (INDEPENDENT_AMBULATORY_CARE_PROVIDER_SITE_OTHER): Payer: Medicare Other | Admitting: Allergy and Immunology

## 2018-11-17 DIAGNOSIS — J3089 Other allergic rhinitis: Secondary | ICD-10-CM | POA: Diagnosis not present

## 2018-11-17 DIAGNOSIS — T7800XD Anaphylactic reaction due to unspecified food, subsequent encounter: Secondary | ICD-10-CM

## 2018-11-17 DIAGNOSIS — L2089 Other atopic dermatitis: Secondary | ICD-10-CM | POA: Diagnosis not present

## 2018-11-17 DIAGNOSIS — J4530 Mild persistent asthma, uncomplicated: Secondary | ICD-10-CM

## 2018-11-17 MED ORDER — TRIAMCINOLONE ACETONIDE 0.1 % EX OINT: TOPICAL_OINTMENT | CUTANEOUS | 5 refills | Status: DC

## 2018-11-17 NOTE — Assessment & Plan Note (Signed)
   Continue meticulous avoidance of nuts, shellfish, and egg and have access to epinephrine autoinjector 2 pack in case of accidental ingestion.

## 2018-11-17 NOTE — Assessment & Plan Note (Signed)
   Continue appropriate skin care measures, desonide twice daily to the face/neck if needed, and triamcinolone 0.1% ointment sparingly to affected areas on the body below the neck twice daily if needed.

## 2018-11-17 NOTE — Assessment & Plan Note (Signed)
   Flovent 110 g, 1 inhalation via spacer device daily, and albuterol HFA, 1-2 inhalations every 4-6 hours if needed.  During respiratory tract infections or asthma flares, increase Flovent 110g to 3 inhalations via spacer device 2 times per day until symptoms have returned to baseline.  Subjective and objective measures of pulmonary function will be followed and the treatment plan will be adjusted accordingly.

## 2018-11-17 NOTE — Assessment & Plan Note (Signed)
   Continue appropriate allergen avoidance measures, fluticasone nasal spray daily, and nasal saline spray if needed.  For runny nose, sneezing, itchy nose, and/or itchy eyes, use cetirizine 10 mg daily as needed.  For thick postnasal drainage use guaifenesin 400 mg 1-2 times daily as needed.

## 2018-11-17 NOTE — Patient Instructions (Signed)
Mild persistent asthma without complication  Flovent 110 g, 1 inhalation via spacer device daily, and albuterol HFA, 1-2 inhalations every 4-6 hours if needed.  During respiratory tract infections or asthma flares, increase Flovent 110g to 3 inhalations via spacer device 2 times per day until symptoms have returned to baseline.  Subjective and objective measures of pulmonary function will be followed and the treatment plan will be adjusted accordingly.  Other allergic rhinitis  Continue appropriate allergen avoidance measures, fluticasone nasal spray daily, and nasal saline spray if needed.  For runny nose, sneezing, itchy nose, and/or itchy eyes, use cetirizine 10 mg daily as needed.  For thick postnasal drainage use guaifenesin 400 mg 1-2 times daily as needed.  Other atopic dermatitis  Continue appropriate skin care measures, desonide twice daily to the face/neck if needed, and triamcinolone 0.1% ointment sparingly to affected areas on the body below the neck twice daily if needed.  Anaphylactic shock due to adverse food reaction  Continue meticulous avoidance of nuts, shellfish, and egg and have access to epinephrine autoinjector 2 pack in case of accidental ingestion.   Return in about 5 months (around 04/19/2019), or if symptoms worsen or fail to improve.

## 2018-11-17 NOTE — Progress Notes (Signed)
Follow-up telephone Note  RE: Francisco Cisneros MRN: [id number] DOB: 31-Oct-1985 Date of Telemedicine Visit: 11/17/2018  Primary care provider: Farris Has, MD Referring provider: Farris Has, MD  Telemedicine Follow Up Visit via telephone: I connected with Ofir Griesinger for a follow up on 11/17/18 via telephone and verified that I am speaking with the correct person using two identifiers.   The limitations, risks, security and privacy concerns of performing an evaluation and management service by telemedicine, the availability of in person appointments, and that there may be a patient responsible charge related to this service were discussed. The patient expressed understanding and agreed to proceed.  Patient is at home accompanied by his parents who provided/contributed to the history.  Provider is at the office.  Visit start time: 2:55 pm Visit end time: 3:33 pm Insurance consent/check in by: Hilda Lias Medical consent and medical assistant/nurse: Ashleigh  History of present illness: Francisco Cisneros is a 33 y.o. male with persistent asthma, allergic rhinitis, atopic dermatitis, and food allergy presenting today via telephone for follow-up.  He was last seen in this clinic in December 2019 by Thermon Leyland.  He is accompanied today by his parents who assist with the history.  His parents report that his asthma has been well controlled overall with the rare albuterol requirement.  For a week in February he experienced a dry, persistent cough.  At that time, he was switched from cetirizine to guaifenesin and was given albuterol daily for 7 days.  The cough resolved.  His parents believe that the cough was due to dry air and being around a fireplace.  His eczema has been well controlled with desonide on the face and neck and triamcinolone on the body below the neck.  Refill prescription for triamcinolone ointment has been requested.  He avoids peanuts, tree nuts, shellfish, and egg and his caregivers  have access to epinephrine autoinjectors.  Assessment and plan: Mild persistent asthma without complication  Flovent 110 g, 1 inhalation via spacer device daily, and albuterol HFA, 1-2 inhalations every 4-6 hours if needed.  During respiratory tract infections or asthma flares, increase Flovent 110g to 3 inhalations via spacer device 2 times per day until symptoms have returned to baseline.  Subjective and objective measures of pulmonary function will be followed and the treatment plan will be adjusted accordingly.  Other allergic rhinitis  Continue appropriate allergen avoidance measures, fluticasone nasal spray daily, and nasal saline spray if needed.  For runny nose, sneezing, itchy nose, and/or itchy eyes, use cetirizine 10 mg daily as needed.  For thick postnasal drainage use guaifenesin 400 mg 1-2 times daily as needed.  Other atopic dermatitis  Continue appropriate skin care measures, desonide twice daily to the face/neck if needed, and triamcinolone 0.1% ointment sparingly to affected areas on the body below the neck twice daily if needed.  Anaphylactic shock due to adverse food reaction  Continue meticulous avoidance of nuts, shellfish, and egg and have access to epinephrine autoinjector 2 pack in case of accidental ingestion.   Meds ordered this encounter  Medications   triamcinolone ointment (KENALOG) 0.1 %    Sig: Apply sparingly to affected areas twice daily as needed, below the face.    Dispense:  30 g    Refill:  5    Diagnostics: None.  Physical examination: Physical Exam Not obtained as encounter was done via telephone.  Previous notes and tests were reviewed.  The following portions of the patient's history were reviewed and updated as appropriate:  allergies, current medications, past family history, past medical history, past social history, past surgical history and problem list.  Allergies as of 11/17/2018      Reactions   Eggs Or Egg-derived  Products Anaphylaxis   Other Anaphylaxis   Allergic to ALL NUTS   Peanut Oil Anaphylaxis   Shellfish Allergy Anaphylaxis   Coconut Oil Rash   Haloperidol Other (See Comments)   Drowsy for days   Peanut-containing Drug Products       Medication List       Accurate as of November 17, 2018  4:21 PM. Always use your most recent med list.        acetaminophen 500 MG tablet Commonly known as:  TYLENOL Take 1,000 mg by mouth every 6 (six) hours as needed for moderate pain.   albuterol (2.5 MG/3ML) 0.083% nebulizer solution Commonly known as:  PROVENTIL Take 2.5 mg by nebulization every 6 (six) hours as needed for wheezing or shortness of breath.   ProAir HFA 108 (90 Base) MCG/ACT inhaler Generic drug:  albuterol Inhale 2 puffs into the lungs every 4 (four) hours as needed for wheezing or shortness of breath.   cetirizine 10 MG tablet Commonly known as:  ZYRTEC Take 10 mg by mouth daily.   Ciclopirox 1 % shampoo Apply 1 Bottle topically at bedtime.   clindamycin 1 % gel Commonly known as:  CLINDAGEL Apply topically 2 (two) times daily.   CULTURELLE DIGESTIVE HEALTH PO Take 1 capsule by mouth daily.   desonide 0.05 % ointment Commonly known as:  DESOWEN Apply sparingly to affected areas twice daily as needed to the face and/or neck.   dexmethylphenidate 10 MG tablet Commonly known as:  FOCALIN Take 10 mg by mouth daily.   dexmethylphenidate 20 MG 24 hr capsule Commonly known as:  FOCALIN XR Take 20 mg by mouth every morning.   docusate sodium 100 MG capsule Commonly known as:  COLACE Take 100 mg by mouth daily as needed for mild constipation.   EPINEPHrine 0.3 mg/0.3 mL Soaj injection Commonly known as:  EPI-PEN Inject 0.3 mLs (0.3 mg total) into the muscle daily as needed (allergic reaction).   Fish Oil 645 MG Caps Take 640 mg by mouth 3 (three) times daily.   fluticasone 50 MCG/ACT nasal spray Commonly known as:  FLONASE Place 2 sprays into both nostrils  daily.   hydrocortisone cream 0.5 % Apply topically.   LORazepam 0.5 MG tablet Commonly known as:  ATIVAN Take 0.5 mg by mouth daily as needed for anxiety.   sertraline 100 MG tablet Commonly known as:  ZOLOFT Take 100 mg by mouth daily.   SYSTANE ULTRA OP Apply 1 drop to eye daily as needed.   terbinafine 1 % cream Commonly known as:  LAMISIL Apply 1 application topically 2 (two) times daily.   triamcinolone ointment 0.1 % Commonly known as:  KENALOG Apply sparingly to affected areas twice daily as needed, below the face.       Allergies  Allergen Reactions   Eggs Or Egg-Derived Products Anaphylaxis   Other Anaphylaxis    Allergic to ALL NUTS   Peanut Oil Anaphylaxis   Shellfish Allergy Anaphylaxis   Coconut Oil Rash   Haloperidol Other (See Comments)    Drowsy for days   Peanut-Containing Drug Products     Review of systems: Review of systems negative except as noted in HPI / PMHx or noted below: Constitutional: Negative.  HENT: Negative.   Eyes: Negative.  Respiratory: Negative.  Cardiovascular: Negative.  Gastrointestinal: Negative.  Genitourinary: Negative.  Musculoskeletal: Negative.  Neurological: Negative.  Endo/Heme/Allergies: Negative.  Cutaneous: Negative.   Past Medical History:  Diagnosis Date   ADHD (attention deficit hyperactivity disorder)    Asthma    Autistic disorder    Eczema     Family History  Problem Relation Age of Onset   Eczema Mother    Allergic rhinitis Neg Hx    Angioedema Neg Hx    Asthma Neg Hx    Immunodeficiency Neg Hx    Urticaria Neg Hx     Social History   Socioeconomic History   Marital status: Single    Spouse name: Not on file   Number of children: Not on file   Years of education: Not on file   Highest education level: Not on file  Occupational History   Not on file  Social Needs   Financial resource strain: Not on file   Food insecurity:    Worry: Not on file     Inability: Not on file   Transportation needs:    Medical: Not on file    Non-medical: Not on file  Tobacco Use   Smoking status: Never Smoker   Smokeless tobacco: Never Used  Substance and Sexual Activity   Alcohol use: No   Drug use: No   Sexual activity: Not on file  Lifestyle   Physical activity:    Days per week: Not on file    Minutes per session: Not on file   Stress: Not on file  Relationships   Social connections:    Talks on phone: Not on file    Gets together: Not on file    Attends religious service: Not on file    Active member of club or organization: Not on file    Attends meetings of clubs or organizations: Not on file    Relationship status: Not on file   Intimate partner violence:    Fear of current or ex partner: Not on file    Emotionally abused: Not on file    Physically abused: Not on file    Forced sexual activity: Not on file  Other Topics Concern   Not on file  Social History Narrative   Not on file    I discussed the assessment and treatment plan with the patient. The patient was provided an opportunity to ask questions and all were answered. The patient agreed with the plan and demonstrated an understanding of the instructions.   The patient was advised to call back or seek an in-person evaluation if the symptoms worsen or if the condition fails to improve as anticipated.  I provided 38 minutes of non-face-to-face time during this encounter.  I appreciate the opportunity to take part in Kordae's care. Please do not hesitate to contact me with questions.  Sincerely,   R. Jorene Guestarter Milford Cilento, MD

## 2018-11-21 DIAGNOSIS — L309 Dermatitis, unspecified: Secondary | ICD-10-CM | POA: Diagnosis not present

## 2018-11-21 DIAGNOSIS — B353 Tinea pedis: Secondary | ICD-10-CM | POA: Diagnosis not present

## 2018-11-21 DIAGNOSIS — K130 Diseases of lips: Secondary | ICD-10-CM | POA: Diagnosis not present

## 2018-11-21 DIAGNOSIS — L03012 Cellulitis of left finger: Secondary | ICD-10-CM | POA: Diagnosis not present

## 2018-12-30 DIAGNOSIS — F419 Anxiety disorder, unspecified: Secondary | ICD-10-CM | POA: Diagnosis not present

## 2019-01-06 DIAGNOSIS — D582 Other hemoglobinopathies: Secondary | ICD-10-CM | POA: Diagnosis not present

## 2019-01-06 DIAGNOSIS — L309 Dermatitis, unspecified: Secondary | ICD-10-CM | POA: Diagnosis not present

## 2019-01-06 DIAGNOSIS — J309 Allergic rhinitis, unspecified: Secondary | ICD-10-CM | POA: Diagnosis not present

## 2019-01-06 DIAGNOSIS — F411 Generalized anxiety disorder: Secondary | ICD-10-CM | POA: Diagnosis not present

## 2019-01-06 DIAGNOSIS — E785 Hyperlipidemia, unspecified: Secondary | ICD-10-CM | POA: Diagnosis not present

## 2019-01-30 DIAGNOSIS — L853 Xerosis cutis: Secondary | ICD-10-CM | POA: Diagnosis not present

## 2019-01-30 DIAGNOSIS — L309 Dermatitis, unspecified: Secondary | ICD-10-CM | POA: Diagnosis not present

## 2019-01-30 DIAGNOSIS — L858 Other specified epidermal thickening: Secondary | ICD-10-CM | POA: Diagnosis not present

## 2019-03-31 DIAGNOSIS — F419 Anxiety disorder, unspecified: Secondary | ICD-10-CM | POA: Diagnosis not present

## 2019-04-20 ENCOUNTER — Ambulatory Visit: Payer: Medicare Other | Admitting: Allergy and Immunology

## 2019-04-22 DIAGNOSIS — Z23 Encounter for immunization: Secondary | ICD-10-CM | POA: Diagnosis not present

## 2019-07-14 DIAGNOSIS — E785 Hyperlipidemia, unspecified: Secondary | ICD-10-CM | POA: Diagnosis not present

## 2019-07-14 DIAGNOSIS — D582 Other hemoglobinopathies: Secondary | ICD-10-CM | POA: Diagnosis not present

## 2019-07-14 DIAGNOSIS — Z0000 Encounter for general adult medical examination without abnormal findings: Secondary | ICD-10-CM | POA: Diagnosis not present

## 2019-11-03 DIAGNOSIS — F419 Anxiety disorder, unspecified: Secondary | ICD-10-CM | POA: Diagnosis not present

## 2020-01-14 ENCOUNTER — Encounter: Payer: Self-pay | Admitting: Family Medicine

## 2020-01-14 ENCOUNTER — Other Ambulatory Visit: Payer: Self-pay

## 2020-01-14 ENCOUNTER — Telehealth: Payer: Self-pay

## 2020-01-14 ENCOUNTER — Ambulatory Visit (INDEPENDENT_AMBULATORY_CARE_PROVIDER_SITE_OTHER): Payer: Medicare Other | Admitting: Family Medicine

## 2020-01-14 DIAGNOSIS — J4530 Mild persistent asthma, uncomplicated: Secondary | ICD-10-CM | POA: Diagnosis not present

## 2020-01-14 DIAGNOSIS — J302 Other seasonal allergic rhinitis: Secondary | ICD-10-CM | POA: Diagnosis not present

## 2020-01-14 DIAGNOSIS — L2089 Other atopic dermatitis: Secondary | ICD-10-CM | POA: Diagnosis not present

## 2020-01-14 DIAGNOSIS — T7800XD Anaphylactic reaction due to unspecified food, subsequent encounter: Secondary | ICD-10-CM

## 2020-01-14 DIAGNOSIS — J3089 Other allergic rhinitis: Secondary | ICD-10-CM

## 2020-01-14 NOTE — Telephone Encounter (Signed)
Patient's mother called concerned that the patient could be getting sick since he was around sick residents (patient lives in a facility). I asked what his symptoms were and when did they start. She states it's just a stuffy nose and sneezing and it started Tuesday evening. I asked if he was taking anything and she states he does a saline rinse every morning, zyrtec and started using the Flonase yesterday. I informed her that since he has not been seen since 11/2018 that he might need to come in for a visit or we could do a televist but I would send a message to see what you advised.

## 2020-01-14 NOTE — Telephone Encounter (Signed)
Yes, he will need a visit, in person or televisit. Thanks

## 2020-01-14 NOTE — Progress Notes (Deleted)
   [address] [place] [address] Dept: [phone]  FOLLOW UP NOTE  Patient ID: Francisco Cisneros, male    DOB: [birth year]  Age: 34 y.o. MRN: [id number] Date of Office Visit: 01/14/2020  Assessment  Chief Complaint: No chief complaint on file.  HPI Francisco Cisneros    Drug Allergies:  Allergies  Allergen Reactions  . Eggs Or Egg-Derived Products Anaphylaxis  . Other Anaphylaxis    Allergic to ALL NUTS  . Peanut Oil Anaphylaxis  . Shellfish Allergy Anaphylaxis  . Coconut Oil Rash  . Haloperidol Other (See Comments)    Drowsy for days  . Peanut-Containing Drug Products     Physical Exam: There were no vitals taken for this visit.   Physical Exam  Diagnostics:    Assessment and Plan: No diagnosis found.  No orders of the defined types were placed in this encounter.   There are no Patient Instructions on file for this visit.  No follow-ups on file.    Thank you for the opportunity to care for this patient.  Please do not hesitate to contact me with questions.  Thermon Leyland, FNP [facility]

## 2020-01-14 NOTE — Patient Instructions (Addendum)
Asthma Begin albuterol 2 puffs once every 4 hours as needed for cough or wheeze  Allergic rhinitis Stop cetirizine for about 5-7 days. Then restart cetirizine 10 mg once a day as needed for a runny nose Continue Flonase 2 sprays in each nostril once a day as needed for a stuffy nose.  In the right nostril, point the applicator out toward the right ear. In the left nostril, point the applicator out toward the left ear Continue saline nasal rinses as needed for nasal symptoms. Use this before any medicated nasal sprays for best result For thick post nasal drainage, begin guaifenesin [phone] mg twice a day Continue allergen avoidance measures directed toward pollens, mold, cat, dog, dust mites, and cockroach as listed below  Cough Begin treatment plan as listed above  Food allergy Continue to avoid peanuts, tree nuts, coconut, and egg. In case of an allergic reaction, take Benadryl 50 mg every 4 hours, and if life-threatening symptoms occur, inject with EpiPen 0.3 mg.   Call the clinic if this treatment plan is not working well for you  Follow up at your previously scheduled appointment on Monday June 14 or sooner if needed.

## 2020-01-14 NOTE — Progress Notes (Signed)
RE: Francisco Cisneros MRN: [id number] DOB: Dec 15, 1985 Date of Telemedicine Visit: 01/14/2020  Referring provider: Farris Has, MD Primary care provider: Farris Has, MD  Chief Complaint: Cough and Nasal Congestion   Telemedicine Follow Up Visit via Telephone: I connected with Francisco Cisneros for a follow up on 01/14/20 by telephone and verified that I am speaking with the correct person using two identifiers.   I discussed the limitations, risks, security and privacy concerns of performing an evaluation and management service by telephone and the availability of in person appointments. I also discussed with the patient that there may be a patient responsible charge related to this service. The patient expressed understanding and agreed to proceed.  Patient is at home accompanied by his mother who provided/contributed to the history.  Provider is at the office.  Visit start time: 300 Visit end time: 88 Insurance consent/check in by: Fae Pippin C Medical consent and medical assistant/nurse: Vonzell Schlatter  History of Present Illness: He is a 34 y.o. male, who is being followed for asthma, allergic rhinitis, atopic dermatitis, and food allergy to peanuts, tree nuts, coconut, and egg.  His previous allergy office visit was on 11/17/2018 with Dr. Nunzio Cobbs. At today's visit, he reports that on Wednesday he began to experience a dry cough, nasal congestion, and clear rhinorrhea. He denies post nasal drainage. He denies fever, however, mom reports that he may have been exposed to sick contacts. He continues cetirizine 10 mg once a day and uses a saline sinus spray once a day. He began using Flonase yesterday. His last serum environmental allergen testing was 03/19/2017 and was positive to pollens, pets, mold, dust mites, and cockroach. Asthma is reported as well controlled with exception of cough. He denies shortness of breath and wheeze with activity or rest. He has not used his albuterol for about one year. He  continues to avoid peanuts, tree nuts, shellfish, and egg with no accidental ingestion or EpiPen use since his last visit to this clinic. His current medications are listed in the chart.  Assessment and Plan: Francisco Cisneros is a 34 y.o. male with: Patient Instructions  Asthma Begin albuterol 2 puffs once every 4 hours as needed for cough or wheeze  Allergic rhinitis Stop cetirizine for about 5-7 days. Then restart cetirizine 10 mg once a day as needed for a runny nose Continue Flonase 2 sprays in each nostril once a day as needed for a stuffy nose.  In the right nostril, point the applicator out toward the right ear. In the left nostril, point the applicator out toward the left ear Continue saline nasal rinses as needed for nasal symptoms. Use this before any medicated nasal sprays for best result For thick post nasal drainage, begin guaifenesin [id number] mg twice a day Continue allergen avoidance measures directed toward pollens, mold, cat, dog, dust mites, and cockroach as listed below  Cough Begin treatment plan as listed above  Food allergy Continue to avoid peanuts, tree nuts, coconut, and egg. In case of an allergic reaction, take Benadryl 50 mg every 4 hours, and if life-threatening symptoms occur, inject with EpiPen 0.3 mg.   Call the clinic if this treatment plan is not working well for you  Follow up at your previously scheduled appointment on Monday June 14 or sooner if needed.   Return in about 4 days (around 01/18/2020), or if symptoms worsen or fail to improve.   Medication List:  Current Outpatient Medications  Medication Sig Dispense Refill  . acetaminophen (TYLENOL) 500 MG  tablet Take 1,000 mg by mouth every 6 (six) hours as needed for moderate pain.    Marland Kitchen albuterol (PROAIR HFA) 108 (90 Base) MCG/ACT inhaler Inhale 2 puffs into the lungs every 4 (four) hours as needed for wheezing or shortness of breath.    . betamethasone valerate (VALISONE) 0.1 % cream Apply topically.    .  cetirizine (ZYRTEC) 10 MG tablet Take 10 mg by mouth daily.    . Ciclopirox 1 % shampoo Apply 1 Bottle topically at bedtime.    . clindamycin (CLINDAGEL) 1 % gel Apply topically 2 (two) times daily.     Marland Kitchen dexmethylphenidate (FOCALIN XR) 20 MG 24 hr capsule Take 20 mg by mouth every morning.    Marland Kitchen dexmethylphenidate (FOCALIN) 10 MG tablet Take 10 mg by mouth daily.    Marland Kitchen docusate sodium (COLACE) 100 MG capsule Take 100 mg by mouth daily as needed for mild constipation.    Marland Kitchen EPINEPHrine 0.3 mg/0.3 mL IJ SOAJ injection Inject 0.3 mLs (0.3 mg total) into the muscle daily as needed (allergic reaction). 1 Device 2  . fluticasone (FLONASE) 50 MCG/ACT nasal spray Place 2 sprays into both nostrils daily. 16 g 5  . hydrocortisone cream 0.5 % Apply topically.    . Lactobacillus-Inulin (CULTURELLE DIGESTIVE HEALTH PO) Take 1 capsule by mouth daily.    Marland Kitchen LORazepam (ATIVAN) 0.5 MG tablet Take 0.5 mg by mouth daily as needed for anxiety.    . Omega-3 Fatty Acids (FISH OIL) 645 MG CAPS Take 640 mg by mouth 3 (three) times daily.     Francisco Faster Glycol-Propyl Glycol (SYSTANE ULTRA OP) Apply 1 drop to eye daily as needed.    . sertraline (ZOLOFT) 50 MG tablet Take 50 mg by mouth daily.    Marland Kitchen terbinafine (LAMISIL) 1 % cream Apply 1 application topically 2 (two) times daily.    Marland Kitchen albuterol (PROVENTIL) (2.5 MG/3ML) 0.083% nebulizer solution Take 2.5 mg by nebulization every 6 (six) hours as needed for wheezing or shortness of breath. (Patient not taking: Reported on 01/14/2020)    . desonide (DESOWEN) 0.05 % ointment Apply sparingly to affected areas twice daily as needed to the face and/or neck. (Patient not taking: Reported on 01/14/2020) 15 g 5  . triamcinolone ointment (KENALOG) 0.1 % Apply sparingly to affected areas twice daily as needed, below the face. (Patient not taking: Reported on 01/14/2020) 30 g 5   No current facility-administered medications for this visit.   Allergies: Allergies  Allergen Reactions  .  Eggs Or Egg-Derived Products Anaphylaxis  . Other Anaphylaxis    Allergic to ALL NUTS  . Peanut Oil Anaphylaxis  . Shellfish Allergy Anaphylaxis  . Coconut Oil Rash  . Haloperidol Other (See Comments)    Drowsy for days  . Peanut-Containing Drug Products    I reviewed his past medical history, social history, family history, and environmental history and no significant changes have been reported from previous visit on 11/17/2018.  Objective: Physical Exam Not obtained as encounter was done via telephone.   Previous notes and tests were reviewed.  I discussed the assessment and treatment plan with the patient. The patient was provided an opportunity to ask questions and all were answered. The patient agreed with the plan and demonstrated an understanding of the instructions.   The patient was advised to call back or seek an in-person evaluation if the symptoms worsen or if the condition fails to improve as anticipated.  I provided 45 minutes of non-face-to-face time during  this encounter.  It was my pleasure to participate in [place] Cisneros's care today. Please feel free to contact me with any questions or concerns.   Sincerely,  Thermon Leyland, FNP

## 2020-01-15 NOTE — Telephone Encounter (Signed)
Patient is scheduled to see Dr.Kim on 6/14.

## 2020-01-17 NOTE — Progress Notes (Signed)
Follow Up Note  RE: Francisco Cisneros MRN: [id number] DOB: [birth year] Date of Office Visit: 01/18/2020  Referring provider: Farris Has, MD Primary care provider: Farris Has, MD  Chief Complaint: Cough and Allergies  History of Present Illness: I had the pleasure of seeing Francisco Cisneros for a follow up visit at the [facility] on 01/19/2020. He is a 34 y.o. male, who is being followed for asthma, allergic rhinitis, food allergies. His previous allergy office visit was on 01/14/2020 with Thermon Leyland, FNP via telemedicine. Today is a regular follow up visit.  He is accompanied today by his mother who provided/contributed to the history. Up to date with COVID-19 vaccine: yes  URI/allergic rhinitis  2018 bloodwork - IgE 6992.  Positive to dust mites, cat, dog, grass, mold, tree, weed, ragweed Patient lives in a group home and a few people with URI symptoms. His symptoms started last week and doing better today.  Still coughing a little bit with minimal phlegm, nasal congestion.  No fevers or chills.  Used albuterol a few times with unknown benefit. Mother is concerned that the home has been giving him albuterol for non-asthma related coughing.   Taking guaifenesin 800mg  twice a day and not sure if helping. Zyrtec was stopped last week.  Started back on fluticasone as well as concerned if his current URI symptoms were exacerbated by the pollen outdoors.   Food allergy 2018 skin testing positive to almond, hazelnut, coconut. 2018 blood work was positive to shellfish, peanuts with ara 2, eggs. Currently avoiding peanuts, tree nuts, coconut and eggs.  No reactions.   Assessment and Plan: Francisco Cisneros is a 34 y.o. male with: Viral upper respiratory tract infection He most likely has a viral URI which is improving since onset. Up to date with COVID-19 vaccine.   Monitor symptoms.   Stop Mucinex.  Seasonal and perennial allergic rhinitis Past history - 2018 bloodwork -  IgE 6992.  Positive to dust mites, cat, dog, grass, mold, tree, weed, ragweed. Interim history - increased nasal symptoms.   Start environmental control measures as below.  May restart cetirizine 10mg  once a day as needed.  Start Fluticasone 1-2 sprays per nostril once a day as needed for nasal symptoms.  Nasal saline spray (i.e., Simply Saline) is recommended as needed especially after being outdoors for a prolonged time.   Mild persistent asthma without complication Some coughing which mother believes it's from PND rather than his asthma.  Today's spirometry was normal.  Discussed at length that sometimes it may be difficult for the staff to distinguish between asthma cough and throat clearing cough from PND. I advised her to talk with the staff and discuss this issue. However, there is no harm in using albuterol if needed during these episodes especially if patient needs it to help with the cough.   May use albuterol rescue inhaler 2 puffs every 4 to 6 hours as needed for shortness of breath, chest tightness, coughing, and wheezing. May use albuterol rescue inhaler 2 puffs 5 to 15 minutes prior to strenuous physical activities. Monitor frequency of use.   Anaphylactic shock due to adverse food reaction Past history - 2018 skin testing positive to almond, hazelnut, coconut.  Interim history - no reactions.   Continue to avoid peanuts, tree nuts, coconut, and egg.  For mild symptoms you can take over the counter antihistamines such as Benadryl and monitor symptoms closely. If symptoms worsen or if you have severe symptoms including breathing issues, throat  closure, significant swelling, whole body hives, severe diarrhea and vomiting, lightheadedness then inject epinephrine and seek immediate medical care afterwards.  Return in about 4 months (around 05/19/2020).  Meds ordered this encounter  Medications  . albuterol (PROAIR HFA) 108 (90 Base) MCG/ACT inhaler    Sig: Inhale 2 puffs  into the lungs every 4 (four) hours as needed for wheezing or shortness of breath (coughing fits).    Dispense:  18 g    Refill:  1  . fluticasone (FLONASE) 50 MCG/ACT nasal spray    Sig: Place 1-2 sprays into both nostrils daily as needed for rhinitis.    Dispense:  16 g    Refill:  5  . cetirizine (ZYRTEC) 10 MG tablet    Sig: Take 1 tablet (10 mg total) by mouth daily as needed for allergies.    Dispense:  30 tablet    Refill:  5   Diagnostics: Spirometry:  Tracings reviewed. His effort: Good reproducible efforts. FVC: 4.64L FEV1: 3.56L, 88% predicted FEV1/FVC ratio: 77% Interpretation: Spirometry consistent with normal pattern.  Please see scanned spirometry results for details.  Medication List:  Current Outpatient Medications  Medication Sig Dispense Refill  . acetaminophen (TYLENOL) 500 MG tablet Take 1,000 mg by mouth every 6 (six) hours as needed for moderate pain.    Marland Kitchen albuterol (PROAIR HFA) 108 (90 Base) MCG/ACT inhaler Inhale 2 puffs into the lungs every 4 (four) hours as needed for wheezing or shortness of breath (coughing fits). 18 g 1  . cetirizine (ZYRTEC) 10 MG tablet Take 10 mg by mouth daily.    . Ciclopirox 1 % shampoo Apply 1 Bottle topically at bedtime.    . clindamycin (CLINDAGEL) 1 % gel Apply topically 2 (two) times daily.     Marland Kitchen dexmethylphenidate (FOCALIN XR) 20 MG 24 hr capsule Take 20 mg by mouth every morning.    Marland Kitchen dexmethylphenidate (FOCALIN) 10 MG tablet Take 10 mg by mouth daily.    Marland Kitchen docusate sodium (COLACE) 100 MG capsule Take 100 mg by mouth daily as needed for mild constipation.    Marland Kitchen EPINEPHrine 0.3 mg/0.3 mL IJ SOAJ injection Inject 0.3 mLs (0.3 mg total) into the muscle daily as needed (allergic reaction). 1 Device 2  . fluticasone (CUTIVATE) 0.05 % cream Apply topically.    . fluticasone (FLONASE) 50 MCG/ACT nasal spray Place 1-2 sprays into both nostrils daily as needed for rhinitis. 16 g 5  . hydrocortisone cream 0.5 % Apply topically.    .  Lactobacillus-Inulin (CULTURELLE DIGESTIVE HEALTH PO) Take 1 capsule by mouth daily.    Marland Kitchen LORazepam (ATIVAN) 0.5 MG tablet Take 0.5 mg by mouth daily as needed for anxiety.    . Omega-3 Fatty Acids (FISH OIL) 645 MG CAPS Take 640 mg by mouth 3 (three) times daily.     Bertram Gala Glycol-Propyl Glycol (SYSTANE ULTRA OP) Apply 1 drop to eye daily as needed.    . sertraline (ZOLOFT) 50 MG tablet Take 50 mg by mouth daily.    Marland Kitchen terbinafine (LAMISIL) 1 % cream Apply 1 application topically 2 (two) times daily.    Marland Kitchen triamcinolone ointment (KENALOG) 0.1 % Apply sparingly to affected areas twice daily as needed, below the face. 30 g 5  . albuterol (PROVENTIL) (2.5 MG/3ML) 0.083% nebulizer solution Take 2.5 mg by nebulization every 6 (six) hours as needed for wheezing or shortness of breath. (Patient not taking: Reported on 01/14/2020)    . betamethasone valerate (VALISONE) 0.1 % cream Apply  topically. (Patient not taking: Reported on 01/18/2020)    . cetirizine (ZYRTEC) 10 MG tablet Take 1 tablet (10 mg total) by mouth daily as needed for allergies. 30 tablet 5  . desonide (DESOWEN) 0.05 % ointment Apply sparingly to affected areas twice daily as needed to the face and/or neck. (Patient not taking: Reported on 01/14/2020) 15 g 5   No current facility-administered medications for this visit.   Allergies: Allergies  Allergen Reactions  . Eggs Or Egg-Derived Products Anaphylaxis  . Other Anaphylaxis    Allergic to ALL NUTS  . Peanut Oil Anaphylaxis  . Shellfish Allergy Anaphylaxis  . Coconut Oil Rash  . Haloperidol Other (See Comments)    Drowsy for days  . Peanut-Containing Drug Products    I reviewed his past medical history, social history, family history, and environmental history and no significant changes have been reported from his previous visit.  Review of Systems  Constitutional: Negative for appetite change, chills, fever and unexpected weight change.  HENT: Positive for congestion.  Negative for rhinorrhea.   Eyes: Negative for itching.  Respiratory: Positive for cough. Negative for chest tightness, shortness of breath and wheezing.   Gastrointestinal: Negative for abdominal pain.  Skin: Negative for rash.  Allergic/Immunologic: Positive for environmental allergies and food allergies.   Objective: BP 110/80 (BP Location: Left Arm, Patient Position: Sitting, Cuff Size: Normal)   Pulse 74   Temp 97.9 F (36.6 C) (Temporal)   Resp 16   Ht 5' 7.5" (1.715 m)   Wt 154 lb 6.4 oz (70 kg)   SpO2 96%   BMI 23.83 kg/m  Body mass index is 23.83 kg/m. Physical Exam Vitals and nursing note reviewed. Exam conducted with a chaperone present.  Constitutional:      Appearance: He is well-developed.  HENT:     Head: Normocephalic and atraumatic.     Right Ear: Tympanic membrane and external ear normal.     Left Ear: Tympanic membrane and external ear normal.     Nose: Congestion and rhinorrhea present.     Mouth/Throat:     Mouth: Mucous membranes are moist.     Pharynx: Oropharynx is clear. No oropharyngeal exudate or posterior oropharyngeal erythema.  Eyes:     Conjunctiva/sclera: Conjunctivae normal.  Cardiovascular:     Rate and Rhythm: Normal rate and regular rhythm.     Heart sounds: Normal heart sounds. No murmur heard.   Pulmonary:     Effort: Pulmonary effort is normal.     Breath sounds: Normal breath sounds. No wheezing, rhonchi or rales.  Musculoskeletal:     Cervical back: Neck supple.  Skin:    General: Skin is warm.     Findings: No rash.  Neurological:     Mental Status: He is alert. Mental status is at baseline.    Previous notes and tests were reviewed. The plan was reviewed with the patient/family, and all questions/concerned were addressed.  It was my pleasure to see Francisco Cisneros today and participate in his care. Please feel free to contact me with any questions or concerns.  Sincerely,  Rexene Alberts, DO Allergy & Immunology  [facility] office: [phone] [facility] office: [place] office: [phone]

## 2020-01-18 ENCOUNTER — Other Ambulatory Visit: Payer: Self-pay

## 2020-01-18 ENCOUNTER — Encounter: Payer: Self-pay | Admitting: Allergy

## 2020-01-18 ENCOUNTER — Ambulatory Visit (INDEPENDENT_AMBULATORY_CARE_PROVIDER_SITE_OTHER): Payer: Medicare Other | Admitting: Allergy

## 2020-01-18 VITALS — BP 110/80 | HR 74 | Temp 97.9°F | Resp 16 | Ht 67.5 in | Wt 154.4 lb

## 2020-01-18 DIAGNOSIS — L2089 Other atopic dermatitis: Secondary | ICD-10-CM

## 2020-01-18 DIAGNOSIS — J3089 Other allergic rhinitis: Secondary | ICD-10-CM

## 2020-01-18 DIAGNOSIS — T7800XD Anaphylactic reaction due to unspecified food, subsequent encounter: Secondary | ICD-10-CM | POA: Diagnosis not present

## 2020-01-18 DIAGNOSIS — J069 Acute upper respiratory infection, unspecified: Secondary | ICD-10-CM

## 2020-01-18 DIAGNOSIS — J4530 Mild persistent asthma, uncomplicated: Secondary | ICD-10-CM | POA: Diagnosis not present

## 2020-01-18 DIAGNOSIS — H10413 Chronic giant papillary conjunctivitis, bilateral: Secondary | ICD-10-CM | POA: Diagnosis not present

## 2020-01-18 DIAGNOSIS — H5213 Myopia, bilateral: Secondary | ICD-10-CM | POA: Diagnosis not present

## 2020-01-18 DIAGNOSIS — J302 Other seasonal allergic rhinitis: Secondary | ICD-10-CM | POA: Diagnosis not present

## 2020-01-18 MED ORDER — FLUTICASONE PROPIONATE 50 MCG/ACT NA SUSP: 1.0000 | Freq: Every day | NASAL | 5 refills | Status: DC | PRN

## 2020-01-18 MED ORDER — ALBUTEROL SULFATE HFA 108 (90 BASE) MCG/ACT IN AERS: 2.0000 | INHALATION_SPRAY | RESPIRATORY_TRACT | 1 refills | Status: DC | PRN

## 2020-01-18 MED ORDER — CETIRIZINE HCL 10 MG PO TABS
10.0000 mg | ORAL_TABLET | Freq: Every day | ORAL | 5 refills | Status: DC | PRN
Start: 2020-01-18 — End: 2020-05-30

## 2020-01-18 NOTE — Patient Instructions (Addendum)
Upper respiratory infection  Monitor symptoms.   Stop Mucinex.  As long as you are improving, nothing else to do.   Allergic rhinitis  2018 allergy testing positive to dust mites, cat, dog, grass, mold, tree, weed, ragweed.  Start environmental control measures as below.  May restart cetirizine 10mg  once a day as needed.  Start Fluticasone 1-2 sprays per nostril once a day as needed for nasal symptoms.  Nasal saline spray (i.e., Simply Saline) is recommended as needed especially after being outdoors for a prolonged time.   Coughing:  May use albuterol rescue inhaler 2 puffs every 4 to 6 hours as needed for shortness of breath, chest tightness, coughing, and wheezing. May use albuterol rescue inhaler 2 puffs 5 to 15 minutes prior to strenuous physical activities. Monitor frequency of use.   Food allergy  2018 skin testing positive to almond, hazelnut, coconut.   Continue to avoid peanuts, tree nuts, coconut, and egg.  For mild symptoms you can take over the counter antihistamines such as Benadryl and monitor symptoms closely. If symptoms worsen or if you have severe symptoms including breathing issues, throat closure, significant swelling, whole body hives, severe diarrhea and vomiting, lightheadedness then inject epinephrine and seek immediate medical care afterwards.  Follow up in 4 months or sooner if needed.   Reducing Pollen Exposure  Pollen seasons: trees (spring), grass (summer) and ragweed/weeds (fall).  Keep windows closed in your home and car to lower pollen exposure.   Install air conditioning in the bedroom and throughout the house if possible.   Avoid going out in dry windy days - especially early morning.  Pollen counts are highest between 5 - 10 AM and on dry, hot and windy days.   Save outside activities for late afternoon or after a heavy rain, when pollen levels are lower.   Avoid mowing of grass if you have grass pollen allergy.  Be aware that  pollen can also be transported indoors on people and pets.   Dry your clothes in an automatic dryer rather than hanging them outside where they might collect pollen.   Rinse hair and eyes before bedtime.  Control of House Dust Mite Allergen  Dust mite allergens are a common trigger of allergy and asthma symptoms. While they can be found throughout the house, these microscopic creatures thrive in warm, humid environments such as bedding, upholstered furniture and carpeting.  Because so much time is spent in the bedroom, it is essential to reduce mite levels there.   Encase pillows, mattresses, and box springs in special allergen-proof fabric covers or airtight, zippered plastic covers.   Bedding should be washed weekly in hot water (130 F) and dried in a hot dryer. Allergen-proof covers are available for comforters and pillows that cant be regularly washed.   Wash the allergy-proof covers every few months. Minimize clutter in the bedroom. Keep pets out of the bedroom.   Keep humidity less than 50% by using a dehumidifier or air conditioning. You can buy a humidity measuring device called a hygrometer to monitor this.   If possible, replace carpets with hardwood, linoleum, or washable area rugs. If that's not possible, vacuum frequently with a vacuum that has a HEPA filter.  Remove all upholstered furniture and non-washable window drapes from the bedroom.  Remove all non-washable stuffed toys from the bedroom.  Wash stuffed toys weekly. Pet Allergen Avoidance:  Contrary to popular opinion, there are no hypoallergenic breeds of dogs or cats. That is because people are not  allergic to an animals hair, but to an allergen found in the animal's saliva, dander (dead skin flakes) or urine. Pet allergy symptoms typically occur within minutes. For some people, symptoms can build up and become most severe 8 to 12 hours after contact with the animal. People with severe allergies can experience  reactions in public places if dander has been transported on the pet owners clothing.  Keeping an animal outdoors is only a partial solution, since homes with pets in the yard still have higher concentrations of animal allergens.  Before getting a pet, ask your allergist to determine if you are allergic to animals. If your pet is already considered part of your family, try to minimize contact and keep the pet out of the bedroom and other rooms where you spend a great deal of time.  As with dust mites, vacuum carpets often or replace carpet with a hardwood floor, tile or linoleum.  High-efficiency particulate air (HEPA) cleaners can reduce allergen levels over time.  While dander and saliva are the source of cat and dog allergens, urine is the source of allergens from rabbits, hamsters, mice and [place] pigs; so ask a non-allergic family member to clean the animals cage.  If you have a pet allergy, talk to your allergist about the potential for allergy immunotherapy (allergy shots). This strategy can often provide long-term relief. Mold Control  Mold and fungi can grow on a variety of surfaces provided certain temperature and moisture conditions exist.   Outdoor molds grow on plants, decaying vegetation and soil. The major outdoor mold, Alternaria and Cladosporium, are found in very high numbers during hot and dry conditions. Generally, a late summer - fall peak is seen for common outdoor fungal spores. Rain will temporarily lower outdoor mold spore count, but counts rise rapidly when the rainy period ends.  The most important indoor molds are Aspergillus and Penicillium. Dark, humid and poorly ventilated basements are ideal sites for mold growth. The next most common sites of mold growth are the bathroom and the kitchen. Outdoor (Seasonal) Mold Control  Use air conditioning and keep windows closed.  Avoid exposure to decaying vegetation.  Avoid leaf raking.  Avoid grain  handling.  Consider wearing a face mask if working in moldy areas.  Indoor (Perennial) Mold Control   Maintain humidity below 50%.  Get rid of mold growth on hard surfaces with water, detergent and, if necessary, 5% bleach (do not mix with other cleaners). Then dry the area completely. If mold covers an area more than 10 square feet, consider hiring an indoor environmental professional.  For clothing, washing with soap and water is best. If moldy items cannot be cleaned and dried, throw them away.  Remove sources e.g. contaminated carpets.  Repair and seal leaking roofs or pipes. Using dehumidifiers in damp basements may be helpful, but empty the water and clean units regularly to prevent mildew from forming. All rooms, especially basements, bathrooms and kitchens, require ventilation and cleaning to deter mold and mildew growth. Avoid carpeting on concrete or damp floors, and storing items in damp areas.

## 2020-01-19 NOTE — Assessment & Plan Note (Signed)
Past history - 2018 bloodwork - IgE 6992.  Positive to dust mites, cat, dog, grass, mold, tree, weed, ragweed. Interim history - increased nasal symptoms.   Start environmental control measures as below.  May restart cetirizine 10mg  once a day as needed.  Start Fluticasone 1-2 sprays per nostril once a day as needed for nasal symptoms.  Nasal saline spray (i.e., Simply Saline) is recommended as needed especially after being outdoors for a prolonged time.

## 2020-01-19 NOTE — Assessment & Plan Note (Signed)
Some coughing which mother believes it's from PND rather than his asthma.  Today's spirometry was normal.  Discussed at length that sometimes it may be difficult for the staff to distinguish between asthma cough and throat clearing cough from PND. I advised her to talk with the staff and discuss this issue. However, there is no harm in using albuterol if needed during these episodes especially if patient needs it to help with the cough.   May use albuterol rescue inhaler 2 puffs every 4 to 6 hours as needed for shortness of breath, chest tightness, coughing, and wheezing. May use albuterol rescue inhaler 2 puffs 5 to 15 minutes prior to strenuous physical activities. Monitor frequency of use.

## 2020-01-19 NOTE — Assessment & Plan Note (Signed)
Past history - 2018 skin testing positive to almond, hazelnut, coconut.  Interim history - no reactions.   Continue to avoid peanuts, tree nuts, coconut, and egg.  For mild symptoms you can take over the counter antihistamines such as Benadryl and monitor symptoms closely. If symptoms worsen or if you have severe symptoms including breathing issues, throat closure, significant swelling, whole body hives, severe diarrhea and vomiting, lightheadedness then inject epinephrine and seek immediate medical care afterwards.

## 2020-01-19 NOTE — Assessment & Plan Note (Signed)
He most likely has a viral URI which is improving since onset. Up to date with COVID-19 vaccine.   Monitor symptoms.   Stop Mucinex.

## 2020-03-01 DIAGNOSIS — B353 Tinea pedis: Secondary | ICD-10-CM | POA: Diagnosis not present

## 2020-03-01 DIAGNOSIS — L309 Dermatitis, unspecified: Secondary | ICD-10-CM | POA: Diagnosis not present

## 2020-03-01 DIAGNOSIS — L308 Other specified dermatitis: Secondary | ICD-10-CM | POA: Diagnosis not present

## 2020-03-15 DIAGNOSIS — Z4802 Encounter for removal of sutures: Secondary | ICD-10-CM | POA: Diagnosis not present

## 2020-03-15 DIAGNOSIS — L03019 Cellulitis of unspecified finger: Secondary | ICD-10-CM | POA: Diagnosis not present

## 2020-03-16 DIAGNOSIS — F419 Anxiety disorder, unspecified: Secondary | ICD-10-CM | POA: Diagnosis not present

## 2020-04-26 DIAGNOSIS — Z23 Encounter for immunization: Secondary | ICD-10-CM | POA: Diagnosis not present

## 2020-05-02 ENCOUNTER — Telehealth: Payer: Self-pay

## 2020-05-02 NOTE — Telephone Encounter (Signed)
He can get the booster at anytime.  No need to wait after the flu shot.

## 2020-05-02 NOTE — Telephone Encounter (Signed)
Patients mom called to see if it will be best for the patient to get his COVID Booster. Patient had following shots: 2nd Covid Vaccine Shot Sep 07 2019 Flu Shot on 04/26/2020  Please Advise.

## 2020-05-03 NOTE — Telephone Encounter (Signed)
Left a detailed message informing mom of Dr. Kim's recommendation. 

## 2020-05-11 DIAGNOSIS — F419 Anxiety disorder, unspecified: Secondary | ICD-10-CM | POA: Diagnosis not present

## 2020-05-18 DIAGNOSIS — Z23 Encounter for immunization: Secondary | ICD-10-CM | POA: Diagnosis not present

## 2020-05-30 ENCOUNTER — Other Ambulatory Visit: Payer: Self-pay

## 2020-05-30 ENCOUNTER — Ambulatory Visit (INDEPENDENT_AMBULATORY_CARE_PROVIDER_SITE_OTHER): Payer: Medicare Other | Admitting: Allergy

## 2020-05-30 ENCOUNTER — Encounter: Payer: Self-pay | Admitting: Allergy

## 2020-05-30 VITALS — BP 114/80 | HR 85 | Temp 98.0°F | Resp 16 | Ht 67.0 in | Wt 160.6 lb

## 2020-05-30 DIAGNOSIS — J4530 Mild persistent asthma, uncomplicated: Secondary | ICD-10-CM | POA: Diagnosis not present

## 2020-05-30 DIAGNOSIS — J3089 Other allergic rhinitis: Secondary | ICD-10-CM | POA: Diagnosis not present

## 2020-05-30 DIAGNOSIS — T7800XD Anaphylactic reaction due to unspecified food, subsequent encounter: Secondary | ICD-10-CM

## 2020-05-30 DIAGNOSIS — L2089 Other atopic dermatitis: Secondary | ICD-10-CM | POA: Diagnosis not present

## 2020-05-30 DIAGNOSIS — J302 Other seasonal allergic rhinitis: Secondary | ICD-10-CM

## 2020-05-30 NOTE — Progress Notes (Signed)
Follow Up Note  RE: Francisco Cisneros MRN: [id number] DOB: 13-Aug-1985 Date of Office Visit: 05/30/2020  Referring provider: Farris Has, MD Primary care provider: Farris Has, MD  Chief Complaint: Asthma (No issues) and Allergic Rhinitis  (Wants to talk about patch testing. Still avoiding eggs)  History of Present Illness: I had the pleasure of seeing Francisco Cisneros for a follow up visit at the [facility] on 05/30/2020. He is a 34 y.o. male, who is being followed for allergic rhinitis, asthma and food allergy. His previous allergy office visit was on 01/18/2020 with Dr. Selena Batten. Today is a regular follow up visit. He is accompanied today by his mother and father who provided/contributed to the history.   Patient lives in a group home.   Seasonal and perennial allergic rhinitis Using zyrtec 10mg  daily and only using Flonase if needed with good benefit. Using [place] daily though and mother not sure if they should continue or stop it.   Mild persistent asthma Denies any SOB, coughing, wheezing, chest tightness, nocturnal awakenings, ER/urgent care visits or prednisone use since the last visit. No albuterol use since the last visit.   Anaphylactic shock due to adverse food reaction Currently avoiding peanuts, tree nuts, coconut, egg and shellfish. Tolerates finned fish with no issues. No reactions since the last visit.  Too much citrus and tomatoes cause some skin rashes.  No Epipen use.  Skin Dermatology recommended patch testing as he breaks out in random rashes. Sometimes if he's anxious it tends to happen more.   Assessment and Plan: [name] is a 34 y.o. male with: Seasonal and perennial allergic rhinitis Past history - 2018 bloodwork - IgE 6992.  Positive to dust mites, cat, dog, grass, mold, tree, weed, ragweed. Interim history - stable.   Continue environmental control measures as below.  Continue cetirizine 10mg  once a day.  May use Flonase  (fluticasone) nasal spray 1 spray per nostril twice a day as needed for nasal congestion.   Nasal saline spray (i.e., Simply Saline) is recommended as needed especially after being outdoors for a prolonged time.   Stop Xlear nasal spray for now.   Mild persistent asthma without complication Well-controlled. No inhaler use since last visit.   May use albuterol rescue inhaler 2 puffs every 4 to 6 hours as needed for shortness of breath, chest tightness, coughing, and wheezing. May use albuterol rescue inhaler 2 puffs 5 to 15 minutes prior to strenuous physical activities. Monitor frequency of use.   Get spirometry at next visit.  Anaphylactic shock due to adverse food reaction Past history - 2018 skin testing positive to almond, hazelnut, coconut.  Interim history - no reactions, also avoiding shellfish but tolerates finned fish.  Continue to avoid peanuts, tree nuts, coconut, egg and shellfish.  For mild symptoms you can take over the counter antihistamines such as Benadryl and monitor symptoms closely. If symptoms worsen or if you have severe symptoms including breathing issues, throat closure, significant swelling, whole body hives, severe diarrhea and vomiting, lightheadedness then inject epinephrine and seek immediate medical care afterwards.  Get bloodwork as below - if negative will consider food challenges.   Other atopic dermatitis Still breaks out randomly. Dermatology recommended patch testing.  See below for proper skin care.  Return in about 5 months (around 10/28/2020).  Lab Orders     Allergen Profile, Shellfish     IgE Nut Prof. w/Component Rflx     Allergen Egg White     Coconut  IgE  Diagnostics: None.  Medication List:  Current Outpatient Medications  Medication Sig Dispense Refill  . acetaminophen (TYLENOL) 500 MG tablet Take 1,000 mg by mouth every 6 (six) hours as needed for moderate pain.    Marland Kitchen albuterol (PROAIR HFA) 108 (90 Base) MCG/ACT inhaler Inhale 2  puffs into the lungs every 4 (four) hours as needed for wheezing or shortness of breath (coughing fits). 18 g 1  . albuterol (PROVENTIL) (2.5 MG/3ML) 0.083% nebulizer solution Take 2.5 mg by nebulization every 6 (six) hours as needed for wheezing or shortness of breath.     . Ciclopirox 1 % shampoo Apply 1 Bottle topically at bedtime.    . clindamycin (CLINDAGEL) 1 % gel Apply topically 2 (two) times daily.     Marland Kitchen desonide (DESOWEN) 0.05 % ointment Apply sparingly to affected areas twice daily as needed to the face and/or neck. 15 g 5  . dexmethylphenidate (FOCALIN XR) 20 MG 24 hr capsule Take 20 mg by mouth every morning.    Marland Kitchen dexmethylphenidate (FOCALIN) 10 MG tablet Take 10 mg by mouth daily.    Marland Kitchen docusate sodium (COLACE) 100 MG capsule Take 100 mg by mouth daily as needed for mild constipation.    Marland Kitchen EPINEPHrine 0.3 mg/0.3 mL IJ SOAJ injection Inject 0.3 mLs (0.3 mg total) into the muscle daily as needed (allergic reaction). 1 Device 2  . fluticasone (CUTIVATE) 0.05 % cream Apply topically.    . fluticasone (FLONASE) 50 MCG/ACT nasal spray Place 1-2 sprays into both nostrils daily as needed for rhinitis. 16 g 5  . hydrocortisone cream 0.5 % Apply topically.    Marland Kitchen ketoconazole (NIZORAL) 2 % cream Apply topically.    . Lactobacillus-Inulin (CULTURELLE DIGESTIVE HEALTH PO) Take 1 capsule by mouth daily.    Marland Kitchen LORazepam (ATIVAN) 0.5 MG tablet Take 0.5 mg by mouth daily as needed for anxiety.    . mupirocin ointment (BACTROBAN) 2 % SMARTSIG:1 Application Topical 2-3 Times Daily    . Omega-3 Fatty Acids (FISH OIL) 645 MG CAPS Take 640 mg by mouth 3 (three) times daily.     Bertram Gala Glycol-Propyl Glycol (SYSTANE ULTRA OP) Apply 1 drop to eye daily as needed.    . sertraline (ZOLOFT) 50 MG tablet Take 50 mg by mouth daily.    Marland Kitchen terbinafine (LAMISIL) 1 % cream Apply 1 application topically 2 (two) times daily.    Marland Kitchen triamcinolone ointment (KENALOG) 0.1 % Apply sparingly to affected areas twice daily as  needed, below the face. 30 g 5  . cetirizine (ZYRTEC) 10 MG tablet Take 10 mg by mouth daily.     No current facility-administered medications for this visit.   Allergies: Allergies  Allergen Reactions  . Eggs Or Egg-Derived Products Anaphylaxis  . Other Anaphylaxis    Allergic to ALL NUTS  . Peanut Oil Anaphylaxis  . Shellfish Allergy Anaphylaxis  . Coconut Oil Rash  . Haloperidol Other (See Comments)    Drowsy for days  . Peanut-Containing Drug Products    I reviewed his past medical history, social history, family history, and environmental history and no significant changes have been reported from his previous visit.  Review of Systems  Constitutional: Negative for appetite change, chills, fever and unexpected weight change.  HENT: Negative for congestion and rhinorrhea.   Eyes: Negative for itching.  Respiratory: Negative for cough, chest tightness, shortness of breath and wheezing.   Gastrointestinal: Negative for abdominal pain.  Skin: Negative for rash.  Allergic/Immunologic: Positive for  environmental allergies and food allergies.   Objective: BP 114/80   Pulse 85   Temp 98 F (36.7 C)   Resp 16   Ht 5\' 7"  (1.702 m)   Wt 160 lb 9.6 oz (72.8 kg)   SpO2 98%   BMI 25.15 kg/m  Body mass index is 25.15 kg/m. Physical Exam Vitals and nursing note reviewed. Exam conducted with a chaperone present.  Constitutional:      Appearance: He is well-developed.  HENT:     Head: Normocephalic and atraumatic.     Right Ear: Tympanic membrane and external ear normal.     Left Ear: Tympanic membrane and external ear normal.     Nose: No congestion or rhinorrhea.     Mouth/Throat:     Mouth: Mucous membranes are moist.     Pharynx: Oropharynx is clear. No oropharyngeal exudate or posterior oropharyngeal erythema.  Eyes:     Conjunctiva/sclera: Conjunctivae normal.  Cardiovascular:     Rate and Rhythm: Normal rate and regular rhythm.     Heart sounds: Normal heart sounds.  No murmur heard.   Pulmonary:     Effort: Pulmonary effort is normal.     Breath sounds: Normal breath sounds. No wheezing, rhonchi or rales.  Musculoskeletal:     Cervical back: Neck supple.  Skin:    General: Skin is warm.     Findings: No rash.  Neurological:     Mental Status: He is alert. Mental status is at baseline.    Previous notes and tests were reviewed. The plan was reviewed with the patient/family, and all questions/concerned were addressed.  It was my pleasure to see Francisco Cisneros today and participate in his care. Please feel free to contact me with any questions or concerns.  Sincerely,  Tinnie Gens, DO Allergy & Immunology  [facility] office: [phone] [facility] office: [phone]

## 2020-05-30 NOTE — Assessment & Plan Note (Signed)
Past history - 2018 skin testing positive to almond, hazelnut, coconut.  Interim history - no reactions, also avoiding shellfish but tolerates finned fish.  Continue to avoid peanuts, tree nuts, coconut, egg and shellfish.  For mild symptoms you can take over the counter antihistamines such as Benadryl and monitor symptoms closely. If symptoms worsen or if you have severe symptoms including breathing issues, throat closure, significant swelling, whole body hives, severe diarrhea and vomiting, lightheadedness then inject epinephrine and seek immediate medical care afterwards.  Get bloodwork as below - if negative will consider food challenges.

## 2020-05-30 NOTE — Assessment & Plan Note (Signed)
Past history - 2018 bloodwork - IgE 6992.  Positive to dust mites, cat, dog, grass, mold, tree, weed, ragweed. Interim history - stable.   Continue environmental control measures as below.  Continue cetirizine 10mg  once a day.  May use Flonase (fluticasone) nasal spray 1 spray per nostril twice a day as needed for nasal congestion.   Nasal saline spray (i.e., Simply Saline) is recommended as needed especially after being outdoors for a prolonged time.   Stop Xlear nasal spray for now.

## 2020-05-30 NOTE — Assessment & Plan Note (Signed)
Well-controlled. No inhaler use since last visit.   May use albuterol rescue inhaler 2 puffs every 4 to 6 hours as needed for shortness of breath, chest tightness, coughing, and wheezing. May use albuterol rescue inhaler 2 puffs 5 to 15 minutes prior to strenuous physical activities. Monitor frequency of use.   Get spirometry at next visit.

## 2020-05-30 NOTE — Patient Instructions (Addendum)
Allergic rhinitis  2018 allergy testing positive to dust mites, cat, dog, grass, mold, tree, weed, ragweed.  Continue environmental control measures as below.  Continue cetirizine 10mg  once a day.  May use Flonase (fluticasone) nasal spray 1 spray per nostril twice a day as needed for nasal congestion.   Nasal saline spray (i.e., Simply Saline) is recommended as needed especially after being outdoors for a prolonged time.   Breathing:   May use albuterol rescue inhaler 2 puffs every 4 to 6 hours as needed for shortness of breath, chest tightness, coughing, and wheezing. May use albuterol rescue inhaler 2 puffs 5 to 15 minutes prior to strenuous physical activities. Monitor frequency of use.   Food allergy  2018 skin testing positive to almond, hazelnut, coconut.   Continue to avoid peanuts, tree nuts, coconut, egg and shellfish.  For mild symptoms you can take over the counter antihistamines such as Benadryl and monitor symptoms closely. If symptoms worsen or if you have severe symptoms including breathing issues, throat closure, significant swelling, whole body hives, severe diarrhea and vomiting, lightheadedness then inject epinephrine and seek immediate medical care afterwards.  Skin:  See below for proper skin care.  Follow up in 5 months or sooner if needed.   Reducing Pollen Exposure . Pollen seasons: trees (spring), grass (summer) and ragweed/weeds (fall). 12-13-1978 Keep windows closed in your home and car to lower pollen exposure.  Marland Kitchen air conditioning in the bedroom and throughout the house if possible.  . Avoid going out in dry windy days - especially early morning. . Pollen counts are highest between 5 - 10 AM and on dry, hot and windy days.  . Save outside activities for late afternoon or after a heavy rain, when pollen levels are lower.  . Avoid mowing of grass if you have grass pollen allergy. Lilian Kapur Be aware that pollen can also be transported indoors on people and pets.   . Dry your clothes in an automatic dryer rather than hanging them outside where they might collect pollen.  . Rinse hair and eyes before bedtime.  Control of House Dust Mite Allergen . Dust mite allergens are a common trigger of allergy and asthma symptoms. While they can be found throughout the house, these microscopic creatures thrive in warm, humid environments such as bedding, upholstered furniture and carpeting. . Because so much time is spent in the bedroom, it is essential to reduce mite levels there.  . Encase pillows, mattresses, and box springs in special allergen-proof fabric covers or airtight, zippered plastic covers.  . Bedding should be washed weekly in hot water (130 F) and dried in a hot dryer. Allergen-proof covers are available for comforters and pillows that can't be regularly washed.  Marland Kitchen the allergy-proof covers every few months. Minimize clutter in the bedroom. Keep pets out of the bedroom.  Reyes Ivan Keep humidity less than 50% by using a dehumidifier or air conditioning. You can buy a humidity measuring device called a hygrometer to monitor this.  . If possible, replace carpets with hardwood, linoleum, or washable area rugs. If that's not possible, vacuum frequently with a vacuum that has a HEPA filter. . Remove all upholstered furniture and non-washable window drapes from the bedroom. . Remove all non-washable stuffed toys from the bedroom.  Wash stuffed toys weekly. Pet Allergen Avoidance: . Contrary to popular opinion, there are no "hypoallergenic" breeds of dogs or cats. That is because people are not allergic to an animal's hair, but to an allergen found  in the animal's saliva, dander (dead skin flakes) or urine. Pet allergy symptoms typically occur within minutes. For some people, symptoms can build up and become most severe 8 to 12 hours after contact with the animal. People with severe allergies can experience reactions in public places if dander has been transported on  the pet owners' clothing. Marland Kitchen Keeping an animal outdoors is only a partial solution, since homes with pets in the yard still have higher concentrations of animal allergens. . Before getting a pet, ask your allergist to determine if you are allergic to animals. If your pet is already considered part of your family, try to minimize contact and keep the pet out of the bedroom and other rooms where you spend a great deal of time. . As with dust mites, vacuum carpets often or replace carpet with a hardwood floor, tile or linoleum. . High-efficiency particulate air (HEPA) cleaners can reduce allergen levels over time. . While dander and saliva are the source of cat and dog allergens, urine is the source of allergens from rabbits, hamsters, mice and [place] pigs; so ask a non-allergic family member to clean the animal's cage. . If you have a pet allergy, talk to your allergist about the potential for allergy immunotherapy (allergy shots). This strategy can often provide long-term relief. Mold Control . Mold and fungi can grow on a variety of surfaces provided certain temperature and moisture conditions exist.  . Outdoor molds grow on plants, decaying vegetation and soil. The major outdoor mold, Alternaria and Cladosporium, are found in very high numbers during hot and dry conditions. Generally, a late summer - fall peak is seen for common outdoor fungal spores. Rain will temporarily lower outdoor mold spore count, but counts rise rapidly when the rainy period ends. . The most important indoor molds are Aspergillus and Penicillium. Dark, humid and poorly ventilated basements are ideal sites for mold growth. The next most common sites of mold growth are the bathroom and the kitchen. Outdoor (Seasonal) Mold Control . Use air conditioning and keep windows closed. . Avoid exposure to decaying vegetation. Marland Kitchen Avoid leaf raking. . Avoid grain handling. . Consider wearing a face mask if working in moldy areas.  Indoor  (Perennial) Mold Control  . Maintain humidity below 50%. . Get rid of mold growth on hard surfaces with water, detergent and, if necessary, 5% bleach (do not mix with other cleaners). Then dry the area completely. If mold covers an area more than 10 square feet, consider hiring an indoor environmental professional. . For clothing, washing with soap and water is best. If moldy items cannot be cleaned and dried, throw them away. . Remove sources e.g. contaminated carpets. . Repair and seal leaking roofs or pipes. Using dehumidifiers in damp basements may be helpful, but empty the water and clean units regularly to prevent mildew from forming. All rooms, especially basements, bathrooms and kitchens, require ventilation and cleaning to deter mold and mildew growth. Avoid carpeting on concrete or damp floors, and storing items in damp areas.  Skin care recommendations  Bath time: . Always use lukewarm water. AVOID very hot or cold water. Marland Kitchen Keep bathing time to 5-10 minutes. . Do NOT use bubble bath. . Use a mild soap and use just enough to wash the dirty areas. . Do NOT scrub skin vigorously.  . After bathing, pat dry your skin with a towel. Do NOT rub or scrub the skin.  Moisturizers and prescriptions:  . ALWAYS apply moisturizers immediately after bathing (  within 3 minutes). This helps to lock-in moisture. . Use the moisturizer several times a day over the whole body. [place]i [place] summer moisturizers include: Aveeno, CeraVe, Cetaphil. [place]i [place] winter moisturizers include: Aquaphor, Vaseline, Cerave, Cetaphil, Eucerin, Vanicream. . When using moisturizers along with medications, the moisturizer should be applied about one hour after applying the medication to prevent diluting effect of the medication or moisturize around where you applied the medications. When not using medications, the moisturizer can be continued twice daily as maintenance.  Laundry and clothing: . Avoid laundry products with added  color or perfumes. . Use unscented hypo-allergenic laundry products such as Tide free, Cheer free & gentle, and All free and clear.  . If the skin still seems dry or sensitive, you can try double-rinsing the clothes. . Avoid tight or scratchy clothing such as wool. . Do not use fabric softeners or dyer sheets.

## 2020-05-30 NOTE — Assessment & Plan Note (Signed)
Still breaks out randomly. Dermatology recommended patch testing.  See below for proper skin care.

## 2020-06-01 LAB — IGE NUT PROF. W/COMPONENT RFLX

## 2020-06-01 LAB — IGE EGG WHITE W/COMPONENT RFLX

## 2020-06-02 LAB — PANEL 603851
F232-IgE Ovalbumin: 0.52 kU/L — AB
F233-IgE Ovomucoid: <0.1 kU/L

## 2020-06-02 LAB — IGE NUT PROF. W/COMPONENT RFLX
F017-IgE Hazelnut (Filbert): 1.18 kU/L — AB
F018-IgE Brazil Nut: 8.82 kU/L — AB
F020-IgE Almond: 0.41 kU/L — AB
F202-IgE Cashew Nut: 2.56 kU/L — AB
F203-IgE Pistachio Nut: 3.39 kU/L — AB
F256-IgE Walnut: 2.31 kU/L — AB
Macadamia Nut, IgE: 0.49 kU/L — AB
Peanut, IgE: 0.82 kU/L — AB
Pecan Nut IgE: 0.17 kU/L — AB

## 2020-06-02 LAB — PANEL 604726
Cor A 1 IgE: 0.25 kU/L — AB
Cor A 14 IgE: 4.6 kU/L — AB
Cor A 8 IgE: <0.1 kU/L
Cor A 9 IgE: 0.64 kU/L — AB

## 2020-06-02 LAB — ALLERGEN PROFILE, SHELLFISH
Clam IgE: 0.31 kU/L — AB
F023-IgE Crab: <0.1 kU/L
F080-IgE Lobster: 0.14 kU/L — AB
F290-IgE Oyster: <0.1 kU/L
Scallop IgE: 1.11 kU/L — AB
Shrimp IgE: 0.25 kU/L — AB

## 2020-06-02 LAB — PANEL 604239: ANA O 3 IgE: 3.01 kU/L — AB

## 2020-06-02 LAB — PEANUT COMPONENTS
F352-IgE Ara h 8: 0.2 kU/L — AB
F422-IgE Ara h 1: 0.13 kU/L — AB
F423-IgE Ara h 2: 0.37 kU/L — AB
F424-IgE Ara h 3: 0.18 kU/L — AB
F427-IgE Ara h 9: 0.13 kU/L — AB
F447-IgE Ara h 6: 0.11 kU/L — AB

## 2020-06-02 LAB — IGE EGG WHITE W/COMPONENT RFLX: F001-IgE Egg White: 0.78 kU/L — AB

## 2020-06-02 LAB — PANEL 604721
Jug R 1 IgE: 3.15 kU/L — AB
Jug R 3 IgE: 0.26 kU/L — AB

## 2020-06-02 LAB — ALLERGEN COMPONENT COMMENTS

## 2020-06-02 LAB — ALLERGEN COCONUT IGE: Allergen Coconut IgE: 0.25 kU/L — AB

## 2020-06-02 LAB — PANEL 604350: Ber E 1 IgE: 21.7 kU/L — AB

## 2020-08-02 DIAGNOSIS — Z0000 Encounter for general adult medical examination without abnormal findings: Secondary | ICD-10-CM | POA: Diagnosis not present

## 2020-08-02 DIAGNOSIS — F411 Generalized anxiety disorder: Secondary | ICD-10-CM | POA: Diagnosis not present

## 2020-08-02 DIAGNOSIS — I4510 Unspecified right bundle-branch block: Secondary | ICD-10-CM | POA: Diagnosis not present

## 2020-08-02 DIAGNOSIS — L2084 Intrinsic (allergic) eczema: Secondary | ICD-10-CM | POA: Diagnosis not present

## 2020-08-02 DIAGNOSIS — J309 Allergic rhinitis, unspecified: Secondary | ICD-10-CM | POA: Diagnosis not present

## 2020-08-02 DIAGNOSIS — J4520 Mild intermittent asthma, uncomplicated: Secondary | ICD-10-CM | POA: Diagnosis not present

## 2020-08-02 DIAGNOSIS — E785 Hyperlipidemia, unspecified: Secondary | ICD-10-CM | POA: Diagnosis not present

## 2020-08-17 DIAGNOSIS — F419 Anxiety disorder, unspecified: Secondary | ICD-10-CM | POA: Diagnosis not present

## 2020-08-17 NOTE — Progress Notes (Signed)
RE: Francisco Cisneros MRN: [id number] DOB: 06/06/1986 Date of Telemedicine Visit: 08/18/2020  Referring provider: [name], MD Primary care provider: [name], MD  Chief Complaint: Allergic Rhinitis  (Coughing a lot not sleeping well, headaches, nasal congestion. Covid testing was negative in December. No wheezing)   Telemedicine Follow Up Visit via Telephone: I connected with Francisco Cisneros for a follow up on 08/18/20 by telephone and verified that I am speaking with the correct person using two identifiers.   I discussed the limitations, risks, security and privacy concerns of performing an evaluation and management service by telephone and the availability of in person appointments. I also discussed with the patient that there may be a patient responsible charge related to this service. The patient expressed understanding and agreed to proceed.  Patient is at group home. Parents on face time with him. Provider is at the office.  Visit start time: 10:24AM Visit end time: 10:54AM Insurance consent/check in by: front desk Medical consent and medical assistant/nurse: Lachelle S.  History of Present Illness: He is a 35 y.o. male, who is being followed for allergic rhinitis, asthma, food allergy and atopic dermatitis. His previous allergy office visit was on 05/30/2020 with Dr. Maudie Cisneros. Today is a regular follow up visit.  URI He went back to the group home on 08/09/2019.  One of the workers there tested positive for COVID-19.  On Saturday evening, patient developed some coughing and not feeling well.   They started him on Mucous relief 864m BID and Flonase 1 spray daily.  Symptoms are improving and patient is quarantining. They did not have access to covid-19 testing. Mother ordered one this week and may be able to test him later. Patient has been quarantining since last Saturday.  Did not use the inhaler for this as mother is concerned about the cleanliness of the spacer.    Patient had his covid-19 booster.   Having hard time sleeping due to coughing.  Pulse ox 97%. No fevers/chills. Eating okay.   Seasonal and perennial allergic rhinitis Some nasal congestion and headaches.   Mild persistent asthma  No inhaler use but coughing.   Anaphylactic shock due to adverse food reaction No reactions.   Assessment and Plan: Francisco Cisneros a 35y.o. male with: Viral URI with cough URI symptoms since 1/8. Positive COVID-19 contact in group home but no testing available to test patient - difficult to drive him to a testing site. Symptoms slowly improving. Main complaints are coughing, headaches.  See below for URI symptom control.  May use OTC pain reliever such as Tylenol or Advil for the headaches.   Stop Mucous relief if not helping.   Restart zyrtec 158monce a day.   Quarantine as per the group home's guidelines.  Discussed that testing later this week or next week when mother can get the testing kit will not change current medical treatment.   Mild persistent asthma without complication Coughing but not used inhaler.   May use albuterol rescue inhaler 2 puffs every 4 to 6 hours as needed for shortness of breath, chest tightness, coughing, and wheezing. May use albuterol rescue inhaler 2 puffs 5 to 15 minutes prior to strenuous physical activities. Monitor frequency of use.   Get spirometry at next visit.  Seasonal and perennial allergic rhinitis Past history - 2018 bloodwork - IgE 6992.  Positive to dust mites, cat, dog, grass, mold, tree, weed, ragweed. Interim history - stable.   Continue environmental control measures as below.  Continue  cetirizine 84m once a day.  May use Flonase (fluticasone) nasal spray 1 spray per nostril twice a day as needed for nasal congestion.   Nasal saline spray (i.e., Simply Saline) is recommended as needed especially after being outdoors for a prolonged time.   Anaphylactic shock due to adverse food  reaction Past history - 2018 skin testing positive to almond, hazelnut, coconut.   2021 bloodwork positive to scallop, tree nuts, peanuts and eggs. Borderline positive to coconut, clam, shrimp and lobster.   Component panel for the peanuts and tree nuts showed - more likely to have anaphylactic reactions to peanuts, hazelnuts, walnuts, cashew and [place].  Continue strict avoidance of peanuts, tree nuts, eggs, shellfish, mollusks (clam, scallop), coconut for now.   If interested we can schedule food challenge to baked egg items, coconut, and shellfish on separate occasions. You must be off antihistamines for 3-5 days before. Must be in good health and not ill. Plan on being in the office for 2-3 hours and must bring in the food you want to do the oral challenge for. You must call to schedule an appointment and specify it's for a food challenge.  For mild symptoms you can take over the counter antihistamines such as Benadryl and monitor symptoms closely. If symptoms worsen or if you have severe symptoms including breathing issues, throat closure, significant swelling, whole body hives, severe diarrhea and vomiting, lightheadedness then inject epinephrine and seek immediate medical care afterwards.  Other atopic dermatitis Past history - Dermatology recommended patch testing.  Continue proper skin care.  Return in about 2 months (around 10/16/2020).  Diagnostics: None.  Medication List:  Current Outpatient Medications  Medication Sig Dispense Refill  . acetaminophen (TYLENOL) 500 MG tablet Take 1,000 mg by mouth every 6 (six) hours as needed for moderate pain.    .Marland Kitchenalbuterol (PROAIR HFA) 108 (90 Base) MCG/ACT inhaler Inhale 2 puffs into the lungs every 4 (four) hours as needed for wheezing or shortness of breath (coughing fits). 18 g 1  . albuterol (PROVENTIL) (2.5 MG/3ML) 0.083% nebulizer solution Take 2.5 mg by nebulization every 6 (six) hours as needed for wheezing or shortness of  breath.     . cetirizine (ZYRTEC) 10 MG tablet Take 10 mg by mouth daily.    . Ciclopirox 1 % shampoo Apply 1 Bottle topically at bedtime.    . clindamycin (CLINDAGEL) 1 % gel Apply topically 2 (two) times daily.     .Marland Kitchendesonide (DESOWEN) 0.05 % ointment Apply sparingly to affected areas twice daily as needed to the face and/or neck. 15 g 5  . dexmethylphenidate (FOCALIN XR) 20 MG 24 hr capsule Take 20 mg by mouth every morning.    .Marland Kitchendexmethylphenidate (FOCALIN) 10 MG tablet Take 10 mg by mouth daily.    .Marland Kitchendocusate sodium (COLACE) 100 MG capsule Take 100 mg by mouth daily as needed for mild constipation.    .Marland KitchenEPINEPHrine 0.3 mg/0.3 mL IJ SOAJ injection Inject 0.3 mLs (0.3 mg total) into the muscle daily as needed (allergic reaction). 1 Device 2  . fluticasone (CUTIVATE) 0.05 % cream Apply topically.    . fluticasone (FLONASE) 50 MCG/ACT nasal spray Place 1-2 sprays into both nostrils daily as needed for rhinitis. 16 g 5  . hydrocortisone cream 0.5 % Apply topically.    .Marland Kitchenketoconazole (NIZORAL) 2 % cream Apply topically.    . Lactobacillus-Inulin (CULTURELLE DIGESTIVE HEALTH PO) Take 1 capsule by mouth daily.    .Marland KitchenLORazepam (ATIVAN) 0.5  MG tablet Take 0.5 mg by mouth daily as needed for anxiety.    . mupirocin ointment (BACTROBAN) 2 % SMARTSIG:1 Application Topical 2-3 Times Daily    . Omega-3 Fatty Acids (FISH OIL) 645 MG CAPS Take 640 mg by mouth 3 (three) times daily.     Vladimir Faster Glycol-Propyl Glycol (SYSTANE ULTRA OP) Apply 1 drop to eye daily as needed.    . sertraline (ZOLOFT) 50 MG tablet Take 50 mg by mouth daily.    Marland Kitchen terbinafine (LAMISIL) 1 % cream Apply 1 application topically 2 (two) times daily.    Marland Kitchen triamcinolone ointment (KENALOG) 0.1 % Apply sparingly to affected areas twice daily as needed, below the face. 30 g 5   No current facility-administered medications for this visit.   Allergies: Allergies  Allergen Reactions  . Eggs Or Egg-Derived Products Anaphylaxis  . Other  Anaphylaxis    Allergic to ALL NUTS  . Peanut Oil Anaphylaxis  . Shellfish Allergy Anaphylaxis  . Coconut Oil Rash  . Haloperidol Other (See Comments)    Drowsy for days  . Peanut-Containing Drug Products    I reviewed his past medical history, social history, family history, and environmental history and no significant changes have been reported from his previous visit.  Review of Systems  Constitutional: Negative for appetite change, chills, fever and unexpected weight change.  HENT: Positive for congestion. Negative for rhinorrhea.   Eyes: Negative for itching.  Respiratory: Positive for cough. Negative for chest tightness, shortness of breath and wheezing.   Gastrointestinal: Negative for abdominal pain.  Skin: Negative for rash.  Allergic/Immunologic: Positive for environmental allergies and food allergies.  Neurological: Positive for headaches.   Objective: Physical Exam Not obtained as encounter was done via telephone.   Previous notes and tests were reviewed.  I discussed the assessment and treatment plan with the patient. The patient was provided an opportunity to ask questions and all were answered. The patient agreed with the plan and demonstrated an understanding of the instructions. After visit summary/patient instructions available via mychart.   The patient was advised to call back or seek an in-person evaluation if the symptoms worsen or if the condition fails to improve as anticipated.  I provided 30 minutes of non-face-to-face time during this encounter.  It was my pleasure to participate in [facility] today. Please feel free to contact me with any questions or concerns.   Sincerely,  Rexene Alberts, DO Allergy & Immunology  [facility] office: [place] office: [phone]

## 2020-08-18 ENCOUNTER — Other Ambulatory Visit: Payer: Self-pay

## 2020-08-18 ENCOUNTER — Ambulatory Visit (INDEPENDENT_AMBULATORY_CARE_PROVIDER_SITE_OTHER): Payer: Medicare Other | Admitting: Allergy

## 2020-08-18 ENCOUNTER — Encounter: Payer: Self-pay | Admitting: Allergy

## 2020-08-18 DIAGNOSIS — L2089 Other atopic dermatitis: Secondary | ICD-10-CM | POA: Diagnosis not present

## 2020-08-18 DIAGNOSIS — J4530 Mild persistent asthma, uncomplicated: Secondary | ICD-10-CM

## 2020-08-18 DIAGNOSIS — T7800XD Anaphylactic reaction due to unspecified food, subsequent encounter: Secondary | ICD-10-CM

## 2020-08-18 DIAGNOSIS — J302 Other seasonal allergic rhinitis: Secondary | ICD-10-CM

## 2020-08-18 DIAGNOSIS — J069 Acute upper respiratory infection, unspecified: Secondary | ICD-10-CM

## 2020-08-18 DIAGNOSIS — J3089 Other allergic rhinitis: Secondary | ICD-10-CM | POA: Diagnosis not present

## 2020-08-18 NOTE — Assessment & Plan Note (Signed)
Past history - Dermatology recommended patch testing.  Continue proper skin care.

## 2020-08-18 NOTE — Assessment & Plan Note (Addendum)
URI symptoms since 1/8. Positive COVID-19 contact in group home but no testing available to test patient - difficult to drive him to a testing site. Symptoms slowly improving. Main complaints are coughing, headaches.  See below for URI symptom control.  May use OTC pain reliever such as Tylenol or Advil for the headaches.   Stop Mucous relief if not helping.   Restart zyrtec $RemoveBefore'10mg'[id number]$  once a day.   Quarantine as per the group home's guidelines.  Discussed that testing later this week or next week when mother can get the testing kit will not change current medical treatment.

## 2020-08-18 NOTE — Assessment & Plan Note (Signed)
Past history - 2018 bloodwork - IgE 6992.  Positive to dust mites, cat, dog, grass, mold, tree, weed, ragweed. Interim history - stable.   Continue environmental control measures as below.  Continue cetirizine 10mg  once a day.  May use Flonase (fluticasone) nasal spray 1 spray per nostril twice a day as needed for nasal congestion.   Nasal saline spray (i.e., Simply Saline) is recommended as needed especially after being outdoors for a prolonged time.

## 2020-08-18 NOTE — Assessment & Plan Note (Signed)
Past history - 2018 skin testing positive to almond, hazelnut, coconut.   2021 bloodwork positive to scallop, tree nuts, peanuts and eggs. Borderline positive to coconut, clam, shrimp and lobster.   Component panel for the peanuts and tree nuts showed - more likely to have anaphylactic reactions to peanuts, hazelnuts, walnuts, cashew and [place] nuts.  Continue strict avoidance of peanuts, tree nuts, eggs, shellfish, mollusks (clam, scallop), coconut for now.   If interested we can schedule food challenge to baked egg items, coconut, and shellfish on separate occasions. You must be off antihistamines for 3-5 days before. Must be in good health and not ill. Plan on being in the office for 2-3 hours and must bring in the food you want to do the oral challenge for. You must call to schedule an appointment and specify it's for a food challenge.  For mild symptoms you can take over the counter antihistamines such as Benadryl and monitor symptoms closely. If symptoms worsen or if you have severe symptoms including breathing issues, throat closure, significant swelling, whole body hives, severe diarrhea and vomiting, lightheadedness then inject epinephrine and seek immediate medical care afterwards.

## 2020-08-18 NOTE — Patient Instructions (Addendum)
URI:  See below for URI symptom control.  May use OTC pain reliever such as Tylenol or Advil for the headaches.   Stop Mucous relief if not helping.   Restart zyrtec 10mg  once a day.   Quarantine as per the group home's guidelines.   Allergic rhinitis  2018 allergy testing positive to dust mites, cat, dog, grass, mold, tree, weed, ragweed.  Continue environmental control measures as below.  Continue cetirizine 10mg  once a day.  May use Flonase (fluticasone) nasal spray 1 spray per nostril twice a day as needed for nasal congestion.   Nasal saline spray (i.e., Simply Saline) is recommended as needed especially after being outdoors for a prolonged time.   Breathing:   May use albuterol rescue inhaler 2 puffs every 4 to 6 hours as needed for shortness of breath, chest tightness, coughing, and wheezing. May use albuterol rescue inhaler 2 puffs 5 to 15 minutes prior to strenuous physical activities. Monitor frequency of use.   Food allergy  2018 skin testing positive to almond, hazelnut, coconut.   2021 bloodwork positive to scallop, tree nuts, peanuts and eggs. Borderline positive to coconut, clam, shrimp and lobster.   Component panel for the peanuts and tree nuts showed - more likely to have anaphylactic reactions to peanuts, hazelnuts, walnuts, cashew and 2019 nuts.   Continue strict avoidance of peanuts, tree nuts, eggs, shellfish, mollusks (clam, scallop), coconut for now.   If interested we can schedule food challenge to baked egg items, coconut, and shellfish on separate occasions. You must be off antihistamines for 3-5 days before. Must be in good health and not ill. Plan on being in the office for 2-3 hours and must bring in the food you want to do the oral challenge for. You must call to schedule an appointment and specify it's for a food challenge.   For mild symptoms you can take over the counter antihistamines such as Benadryl and monitor symptoms closely. If  symptoms worsen or if you have severe symptoms including breathing issues, throat closure, significant swelling, whole body hives, severe diarrhea and vomiting, lightheadedness then inject epinephrine and seek immediate medical care afterwards.  Skin:  Continue proper skin care.  Follow up in March as scheduled.    Drink plenty of fluids.  Water, juice, clear broth or warm lemon water are good choices. Avoid caffeine and alcohol, which can dehydrate you.  Eat chicken soup.  Chicken soup and other warm fluids can be soothing and loosen congestion.  Rest.  Adjust your room's temperature and humidity.  Keep your room warm but not overheated. If the air is dry, a cool-mist humidifier or vaporizer can moisten the air and help ease congestion and coughing. Keep the humidifier clean to prevent the growth of bacteria and molds.  Soothe your throat.  Perform a saltwater gargle. Dissolve one-quarter to a half teaspoon of salt in a 4- to 8-ounce glass of warm water. This can relieve a sore or scratchy throat temporarily.  Use saline nasal drops.  To help relieve nasal congestion, try saline nasal drops. You can buy these drops over the counter, and they can help relieve symptoms ? even in children.  Take over-the-counter cold and cough medications.  For adults and children older than 5, over-the-counter decongestants, antihistamines and pain relievers might offer some symptom relief. However, they won't prevent a cold or shorten its duration.

## 2020-08-18 NOTE — Assessment & Plan Note (Signed)
Coughing but not used inhaler.   May use albuterol rescue inhaler 2 puffs every 4 to 6 hours as needed for shortness of breath, chest tightness, coughing, and wheezing. May use albuterol rescue inhaler 2 puffs 5 to 15 minutes prior to strenuous physical activities. Monitor frequency of use.   Get spirometry at next visit.

## 2020-10-13 DIAGNOSIS — F419 Anxiety disorder, unspecified: Secondary | ICD-10-CM | POA: Diagnosis not present

## 2020-10-31 ENCOUNTER — Other Ambulatory Visit: Payer: Self-pay

## 2020-10-31 ENCOUNTER — Ambulatory Visit (INDEPENDENT_AMBULATORY_CARE_PROVIDER_SITE_OTHER): Payer: Medicare Other | Admitting: Allergy

## 2020-10-31 ENCOUNTER — Encounter: Payer: Self-pay | Admitting: Allergy

## 2020-10-31 VITALS — BP 112/78 | HR 83 | Temp 98.3°F | Resp 18

## 2020-10-31 DIAGNOSIS — J3089 Other allergic rhinitis: Secondary | ICD-10-CM

## 2020-10-31 DIAGNOSIS — L2089 Other atopic dermatitis: Secondary | ICD-10-CM | POA: Diagnosis not present

## 2020-10-31 DIAGNOSIS — T7800XD Anaphylactic reaction due to unspecified food, subsequent encounter: Secondary | ICD-10-CM | POA: Diagnosis not present

## 2020-10-31 DIAGNOSIS — J302 Other seasonal allergic rhinitis: Secondary | ICD-10-CM

## 2020-10-31 DIAGNOSIS — J4530 Mild persistent asthma, uncomplicated: Secondary | ICD-10-CM | POA: Diagnosis not present

## 2020-10-31 MED ORDER — DESONIDE 0.05 % EX OINT
TOPICAL_OINTMENT | CUTANEOUS | 5 refills | Status: DC
Start: 2020-10-31 — End: 2020-10-31

## 2020-10-31 MED ORDER — DESONIDE 0.05 % EX OINT: TOPICAL_OINTMENT | CUTANEOUS | 5 refills | Status: AC

## 2020-10-31 NOTE — Assessment & Plan Note (Signed)
Past history - Dermatology recommended patch testing. Interim history - some erythema/dryness on the eyelids.  Continue proper skin care.  Moisturize the eyelid the best as possible.   May use desonide 0.05% ointment twice a day as needed for mild eczema flares - okay to use on the face, neck, groin area. Do not use more than 1 week at a time.  Parents will apply when he is at home during [place] break.

## 2020-10-31 NOTE — Assessment & Plan Note (Signed)
Past history - 2018 skin testing positive to almond, hazelnut, coconut. 2021 bloodwork positive to scallop, tree nuts, peanuts and eggs. Borderline positive to coconut, clam, shrimp and lobster. Component panel for the peanuts and tree nuts showed - more likely to have anaphylactic reactions to peanuts, hazelnuts, walnuts, cashew and [place] nuts. Interim history - parents not sure if he is eating baked egg items or not at the group home.  Continue strict avoidance of peanuts, tree nuts, eggs, shellfish, mollusks (clam, scallop), coconut for now.   Check with the home if he is eating any baked egg items.  If interested we can schedule food challenge to certain foods - depending on what you find out (baked eggs vs scrambled eggs, coconut, shellfish).   For mild symptoms you can take over the counter antihistamines such as Benadryl and monitor symptoms closely. If symptoms worsen or if you have severe symptoms including breathing issues, throat closure, significant swelling, whole body hives, severe diarrhea and vomiting, lightheadedness then inject epinephrine and seek immediate medical care afterwards.

## 2020-10-31 NOTE — Assessment & Plan Note (Signed)
Past history - 2018 bloodwork - IgE 6992.  Positive to dust mites, cat, dog, grass, mold, tree, weed, ragweed. Interim history - itchy eyes but did not tolerate eye drops in the past.   Continue environmental control measures as below.  Continue cetirizine 10mg  once a day.  May use Flonase (fluticasone) nasal spray 1 spray per nostril twice a day as needed for nasal congestion.   Nasal saline spray (i.e., Simply Saline) is recommended as needed especially after being outdoors for a prolonged time.

## 2020-10-31 NOTE — Patient Instructions (Addendum)
Allergic rhinitis  2018 allergy testing positive to dust mites, cat, dog, grass, mold, tree, weed, ragweed.  Continue environmental control measures as below.  Continue cetirizine 10mg  once a day.  May use Flonase (fluticasone) nasal spray 1 spray per nostril twice a day as needed for nasal congestion.   Nasal saline spray (i.e., Simply Saline) is recommended as needed especially after being outdoors for a prolonged time.   Breathing:   May use albuterol rescue inhaler 2 puffs every 4 to 6 hours as needed for shortness of breath, chest tightness, coughing, and wheezing. May use albuterol rescue inhaler 2 puffs 5 to 15 minutes prior to strenuous physical activities. Monitor frequency of use.   Food allergy  2018 skin testing positive to almond, hazelnut, coconut.   2021 bloodwork positive to scallop, tree nuts, peanuts and eggs. Borderline positive to coconut, clam, shrimp and lobster.   Component panel for the peanuts and tree nuts showed - more likely to have anaphylactic reactions to peanuts, hazelnuts, walnuts, cashew and 2022 nuts.   Continue strict avoidance of peanuts, tree nuts, eggs, shellfish, mollusks (clam, scallop), coconut for now.   Check with the home if he is eating any baked egg items and let me know.   If interested we can schedule food challenge to certain foods - depending on what you find out. You must be off antihistamines for 3-5 days before. Must be in good health and not ill. Plan on being in the office for 2-3 hours and must bring in the food you want to do the oral challenge for. You must call to schedule an appointment and specify it's for a food challenge.   For mild symptoms you can take over the counter antihistamines such as Benadryl and monitor symptoms closely. If symptoms worsen or if you have severe symptoms including breathing issues, throat closure, significant swelling, whole body hives, severe diarrhea and vomiting, lightheadedness then  inject epinephrine and seek immediate medical care afterwards.  Skin:  Continue proper skin care.  Moisturize the eyelid the best as possible.   May use desonide 0.05% ointment twice a day as needed for mild eczema flares - okay to use on the face, neck, groin area. Do not use more than 1 week at a time.  Follow up in 4 months or sooner if needed.

## 2020-10-31 NOTE — Assessment & Plan Note (Signed)
Well-controlled.  Francisco Cisneros had COVID-19 in January 2022 but not sure - positive contact but no positive testing.   Today's spirometry showed some mild obstruction.  ACT score 23.  Francisco Cisneros use albuterol rescue inhaler 2 puffs every 4 to 6 hours as needed for shortness of breath, chest tightness, coughing, and wheezing. Francisco Cisneros use albuterol rescue inhaler 2 puffs 5 to 15 minutes prior to strenuous physical activities. Monitor frequency of use.

## 2020-10-31 NOTE — Progress Notes (Signed)
Follow Up Note  RE: Francisco Cisneros MRN: [id number] DOB: 03/29/1986 Date of Office Visit: 10/31/2020  Referring provider: Farris Has, MD Primary care provider: Farris Has, MD  Chief Complaint: Follow-up  History of Present Illness: I had the pleasure of seeing Francisco Cisneros for a follow up visit at the [facility] on 10/31/2020. He is a 35 y.o. male, who is being followed for asthma, allergic rhinitis, atopic dermatitis and food allergy. His previous allergy office visit was on 08/18/2020 with Dr. Selena Batten via telemedicine. Today is a regular follow up visit. He is accompanied today by his mother and father who provided/contributed to the history.   Mild persistent asthma Denies any SOB, coughing, wheezing, chest tightness, nocturnal awakenings, ER/urgent care visits or prednisone use since the last visit. Parents think he had covid-19 in January but now back to baseline.   Seasonal and perennial allergic rhinitis Not using any Flonase currently. Taking zyrtec 10mg  daily. Some itchy/red skin around the eyes. They tried eye drops in the past unsuccessfully. They also tried to use desonide on the skin in the past. Patient is coming home for Easter and will try to apply again.  Anaphylactic shock due to adverse food reaction Currently avoiding peanuts, tree nuts, eggs, shellfish, mollusks, coconut. Parents think he has been eating baked egg items but not sure. They need to check with the group home first.   Other atopic dermatitis Stable.  Assessment and Plan: Francisco Cisneros is a 35 y.o. male with: Mild persistent asthma without complication Well-controlled.  May had COVID-19 in January 2022 but not sure - positive contact but no positive testing.   Today's spirometry showed some mild obstruction.  ACT score 23.  May use albuterol rescue inhaler 2 puffs every 4 to 6 hours as needed for shortness of breath, chest tightness, coughing, and wheezing. May use  albuterol rescue inhaler 2 puffs 5 to 15 minutes prior to strenuous physical activities. Monitor frequency of use.   Seasonal and perennial allergic rhinitis Past history - 2018 bloodwork - IgE 6992.  Positive to dust mites, cat, dog, grass, mold, tree, weed, ragweed. Interim history - itchy eyes but did not tolerate eye drops in the past.   Continue environmental control measures as below.  Continue cetirizine 10mg  once a day.  May use Flonase (fluticasone) nasal spray 1 spray per nostril twice a day as needed for nasal congestion.   Nasal saline spray (i.e., Simply Saline) is recommended as needed especially after being outdoors for a prolonged time.   Anaphylactic shock due to adverse food reaction Past history - 2018 skin testing positive to almond, hazelnut, coconut. 2021 bloodwork positive to scallop, tree nuts, peanuts and eggs. Borderline positive to coconut, clam, shrimp and lobster. Component panel for the peanuts and tree nuts showed - more likely to have anaphylactic reactions to peanuts, hazelnuts, walnuts, cashew and 2019 nuts. Interim history - parents not sure if he is eating baked egg items or not at the group home.  Continue strict avoidance of peanuts, tree nuts, eggs, shellfish, mollusks (clam, scallop), coconut for now.   Check with the home if he is eating any baked egg items.  If interested we can schedule food challenge to certain foods - depending on what you find out (baked eggs vs scrambled eggs, coconut, shellfish).   For mild symptoms you can take over the counter antihistamines such as Benadryl and monitor symptoms closely. If symptoms worsen or if you have severe symptoms including breathing  issues, throat closure, significant swelling, whole body hives, severe diarrhea and vomiting, lightheadedness then inject epinephrine and seek immediate medical care afterwards.  Other atopic dermatitis Past history - Dermatology recommended patch testing. Interim  history - some erythema/dryness on the eyelids.  Continue proper skin care.  Moisturize the eyelid the best as possible.   May use desonide 0.05% ointment twice a day as needed for mild eczema flares - okay to use on the face, neck, groin area. Do not use more than 1 week at a time.  Parents will apply when he is at home during [place] break.  Return in about 4 months (around 03/02/2021).  Meds ordered this encounter  Medications  . DISCONTD: desonide (DESOWEN) 0.05 % ointment    Sig: Apply sparingly to affected areas twice daily as needed to the face and/or neck.    Dispense:  60 g    Refill:  5  . desonide (DESOWEN) 0.05 % ointment    Sig: Apply sparingly to affected areas twice daily as needed to the face and/or neck.    Dispense:  15 g    Refill:  5    Please dispense 15g tube instead of 60g tube per patient request.   Lab Orders  No laboratory test(s) ordered today    Diagnostics: Spirometry:  Tracings reviewed. His effort: It was hard to get consistent efforts and there is a question as to whether this reflects a maximal maneuver. FVC: 4.85L FEV1: 3.27L, 81% predicted FEV1/FVC ratio: 67% Interpretation: Spirometry consistent with mild obstructive disease.  Please see scanned spirometry results for details.  Medication List:  Current Outpatient Medications  Medication Sig Dispense Refill  . acetaminophen (TYLENOL) 500 MG tablet Take 1,000 mg by mouth every 6 (six) hours as needed for moderate pain.    Marland Kitchen albuterol (PROAIR HFA) 108 (90 Base) MCG/ACT inhaler Inhale 2 puffs into the lungs every 4 (four) hours as needed for wheezing or shortness of breath (coughing fits). 18 g 1  . albuterol (PROVENTIL) (2.5 MG/3ML) 0.083% nebulizer solution Take 2.5 mg by nebulization every 6 (six) hours as needed for wheezing or shortness of breath.     . cetirizine (ZYRTEC) 10 MG tablet Take 10 mg by mouth daily.    . Ciclopirox 1 % shampoo Apply 1 Bottle topically at bedtime.    .  clindamycin (CLINDAGEL) 1 % gel Apply topically 2 (two) times daily.     Marland Kitchen dexmethylphenidate (FOCALIN XR) 20 MG 24 hr capsule Take 20 mg by mouth every morning.    Marland Kitchen dexmethylphenidate (FOCALIN) 10 MG tablet Take 10 mg by mouth daily.    Marland Kitchen docusate sodium (COLACE) 100 MG capsule Take 100 mg by mouth daily as needed for mild constipation.    Marland Kitchen EPINEPHrine 0.3 mg/0.3 mL IJ SOAJ injection Inject 0.3 mLs (0.3 mg total) into the muscle daily as needed (allergic reaction). 1 Device 2  . fluticasone (CUTIVATE) 0.05 % cream Apply topically.    . fluticasone (FLONASE) 50 MCG/ACT nasal spray Place 1-2 sprays into both nostrils daily as needed for rhinitis. 16 g 5  . hydrocortisone cream 0.5 % Apply topically.    Marland Kitchen ketoconazole (NIZORAL) 2 % cream Apply topically.    . Lactobacillus-Inulin (CULTURELLE DIGESTIVE HEALTH PO) Take 1 capsule by mouth daily.    Marland Kitchen LORazepam (ATIVAN) 0.5 MG tablet Take 0.5 mg by mouth daily as needed for anxiety.    . mupirocin ointment (BACTROBAN) 2 % SMARTSIG:1 Application Topical 2-3 Times Daily    .  Omega-3 Fatty Acids (FISH OIL) 645 MG CAPS Take 640 mg by mouth 3 (three) times daily.     Bertram Gala Glycol-Propyl Glycol (SYSTANE ULTRA OP) Apply 1 drop to eye daily as needed.    . sertraline (ZOLOFT) 50 MG tablet Take 50 mg by mouth daily.    Marland Kitchen terbinafine (LAMISIL) 1 % cream Apply 1 application topically 2 (two) times daily.    Marland Kitchen triamcinolone ointment (KENALOG) 0.1 % Apply sparingly to affected areas twice daily as needed, below the face. 30 g 5  . desonide (DESOWEN) 0.05 % ointment Apply sparingly to affected areas twice daily as needed to the face and/or neck. 15 g 5   No current facility-administered medications for this visit.   Allergies: Allergies  Allergen Reactions  . Eggs Or Egg-Derived Products Anaphylaxis  . Other Anaphylaxis    Allergic to ALL NUTS  . Peanut Oil Anaphylaxis  . Shellfish Allergy Anaphylaxis  . Coconut Oil Rash  . Haloperidol Other (See  Comments)    Drowsy for days  . Peanut-Containing Drug Products    I reviewed his past medical history, social history, family history, and environmental history and no significant changes have been reported from his previous visit.  Review of Systems  Constitutional: Negative for appetite change, chills, fever and unexpected weight change.  HENT: Negative for congestion and rhinorrhea.   Eyes: Positive for itching.  Respiratory: Negative for cough, chest tightness, shortness of breath and wheezing.   Gastrointestinal: Negative for abdominal pain.  Skin: Positive for rash.  Allergic/Immunologic: Positive for environmental allergies and food allergies.   Objective: BP 112/78 (BP Location: Right Arm, Patient Position: Sitting, Cuff Size: Normal)   Pulse 83   Temp 98.3 F (36.8 C) (Temporal)   Resp 18   SpO2 96%  There is no height or weight on file to calculate BMI. Physical Exam Vitals and nursing note reviewed. Exam conducted with a chaperone present.  Constitutional:      Appearance: He is well-developed.  HENT:     Head: Normocephalic and atraumatic.     Right Ear: Tympanic membrane and external ear normal.     Left Ear: Tympanic membrane and external ear normal.     Nose: No congestion or rhinorrhea.     Mouth/Throat:     Mouth: Mucous membranes are moist.     Pharynx: Oropharynx is clear. No oropharyngeal exudate or posterior oropharyngeal erythema.  Eyes:     Conjunctiva/sclera: Conjunctivae normal.  Cardiovascular:     Rate and Rhythm: Normal rate and regular rhythm.     Heart sounds: Normal heart sounds. No murmur heard.   Pulmonary:     Effort: Pulmonary effort is normal.     Breath sounds: Normal breath sounds. No wheezing, rhonchi or rales.  Musculoskeletal:     Cervical back: Neck supple.  Skin:    General: Skin is warm.     Findings: Rash present.     Comments: Dry skin periorbitally  Neurological:     Mental Status: He is alert. Mental status is at  baseline.    Previous notes and tests were reviewed. The plan was reviewed with the patient/family, and all questions/concerned were addressed.  It was my pleasure to see Francisco Cisneros today and participate in his care. Please feel free to contact me with any questions or concerns.  Sincerely,  Wyline Mood, DO Allergy & Immunology  [facility] office: [phone] [facility] office: [phone]

## 2021-01-12 DIAGNOSIS — F419 Anxiety disorder, unspecified: Secondary | ICD-10-CM | POA: Diagnosis not present

## 2021-02-13 DIAGNOSIS — L2084 Intrinsic (allergic) eczema: Secondary | ICD-10-CM | POA: Diagnosis not present

## 2021-02-13 DIAGNOSIS — D582 Other hemoglobinopathies: Secondary | ICD-10-CM | POA: Diagnosis not present

## 2021-02-13 DIAGNOSIS — J4520 Mild intermittent asthma, uncomplicated: Secondary | ICD-10-CM | POA: Diagnosis not present

## 2021-02-13 DIAGNOSIS — L309 Dermatitis, unspecified: Secondary | ICD-10-CM | POA: Diagnosis not present

## 2021-02-13 DIAGNOSIS — E785 Hyperlipidemia, unspecified: Secondary | ICD-10-CM | POA: Diagnosis not present

## 2021-02-14 DIAGNOSIS — E785 Hyperlipidemia, unspecified: Secondary | ICD-10-CM | POA: Diagnosis not present

## 2021-02-14 DIAGNOSIS — L309 Dermatitis, unspecified: Secondary | ICD-10-CM | POA: Diagnosis not present

## 2021-02-14 DIAGNOSIS — D582 Other hemoglobinopathies: Secondary | ICD-10-CM | POA: Diagnosis not present

## 2021-03-06 ENCOUNTER — Ambulatory Visit (INDEPENDENT_AMBULATORY_CARE_PROVIDER_SITE_OTHER): Payer: Medicare Other | Admitting: Allergy

## 2021-03-06 ENCOUNTER — Other Ambulatory Visit: Payer: Self-pay

## 2021-03-06 ENCOUNTER — Encounter: Payer: Self-pay | Admitting: Allergy

## 2021-03-06 VITALS — BP 118/82 | HR 76 | Temp 97.6°F | Resp 18 | Ht 67.0 in | Wt 161.8 lb

## 2021-03-06 DIAGNOSIS — J4530 Mild persistent asthma, uncomplicated: Secondary | ICD-10-CM

## 2021-03-06 DIAGNOSIS — K121 Other forms of stomatitis: Secondary | ICD-10-CM | POA: Diagnosis not present

## 2021-03-06 DIAGNOSIS — J302 Other seasonal allergic rhinitis: Secondary | ICD-10-CM | POA: Diagnosis not present

## 2021-03-06 DIAGNOSIS — L2089 Other atopic dermatitis: Secondary | ICD-10-CM | POA: Diagnosis not present

## 2021-03-06 DIAGNOSIS — T7800XD Anaphylactic reaction due to unspecified food, subsequent encounter: Secondary | ICD-10-CM

## 2021-03-06 DIAGNOSIS — J3089 Other allergic rhinitis: Secondary | ICD-10-CM

## 2021-03-06 NOTE — Progress Notes (Signed)
Follow Up Note  RE: Francisco Cisneros [place] MRN: [id number] DOB: Jun 20, 1986 Date of Office Visit: 03/06/2021  Referring provider: Farris Cisneros, Aaron, MD Primary care provider: Farris Cisneros, Aaron, MD  Chief Complaint: Cough (On and off possible due to allergies ), Asthma, and Allergic Rhinitis  (Red itchy eyes - the outside of his eyes has white bumps )  History of Present Illness: I had the pleasure of seeing Francisco Cisneros W[place] for a follow up visit at the [facility] on 03/06/2021. He is a 35 y.o. male, who is being followed for asthma, allergic rhinitis, food allergy and atopic dermatitis. His previous allergy office visit was on 10/31/2020 with Dr. Selena Cisneros. Today is a regular follow up visit. He is accompanied today by his mother and father who provided/contributed to the history.   Mild persistent asthma  Had an episode of coughing a few weeks ago which resolved without using albuterol.  Mother doesn't like to use albuterol if not necessary.   Denies any SOB, wheezing, chest tightness, nocturnal awakenings, ER/urgent care visits or prednisone use since the last visit.  Seasonal and perennial allergic rhino conjunctivitis/atopic dermatitis Noticed some puffy/red eye which is scaly since April on and off. Used desonide with some benefit.  Currently using warm cotton on the eyelids but he is rubbing his eyes a lot.  He is using head and shoulders for shampoo.   Takes zyrtec 10mg  daily. Not using Flonase.   Anaphylactic shock due to adverse food reaction Tolerates baked eggs with no issues.  Sometimes he is eating mayo and not sure if that's what's worsening his eczema and itchy eyes sometimes.  Patient lives in a group home and it's difficult to control his diet.  No reactions where he needed to use Epipen.   Oral ulcers? Patient has been following with dentist for this and was prescribed an antiviral which he didn't take the full course and mom didn't think it was helping.    Assessment and Plan: [name] is a 35 y.o. male with: Mild persistent asthma without complication Coughing a few weeks ago but did not use albuterol.  Today's spirometry was unremarkable. ACT score 25. May use albuterol rescue inhaler 2 puffs every 4 to 6 hours as needed for shortness of breath, chest tightness, coughing, and wheezing. May use albuterol rescue inhaler 2 puffs 5 to 15 minutes prior to strenuous physical activities. Monitor frequency of use.   Seasonal and perennial allergic rhinitis Past history - 2018 bloodwork - IgE 6992.  Positive to dust mites, cat, dog, grass, mold, tree, weed, ragweed. Does not like to use eye drops.  Interim history - itchy eyes which is making periorbital eczema worse.  Continue environmental control measures as below. Continue cetirizine 10mg  once a day and may take twice a day during flares. Use Flonase (fluticasone) nasal spray 1 spray per nostril twice a day as needed for nasal congestion.  Nasal saline spray (i.e., Simply Saline) is recommended as needed especially after being outdoors for a prolonged time.  Consider allergy injections for long term control if above medications do not help the symptoms - handout given.  Let Francisco Cisneros know when ready to start.  This may be a good option as patient has difficulty doing eye drops and nasal sprays.  Patient lives in a group home and there would be significant time commitment required by the parents due to transportation.  Get bloodwork for environmental allergy panel to see if patient's allergies have changed.   Other atopic  dermatitis Past history - Dermatology recommended patch testing. Interim history - eczema flare periorbitally.  Start prednisone taper. Prednisone 10mg  tablets - take 2 tablets for 4 days then 1 tablet on day 5.  Continue proper skin care - use fragrance free and dye free shampoo such as Vanicream.  Do not use head and shoulders shampoo.  Moisturize the eyelid the best as possible.   May use desonide 0.05% ointment twice a day as needed for mild eczema flares - okay to use on the face, neck, groin area. Do not use more than 1 week at a time.   Anaphylactic shock due to adverse food reaction Past history - 2018 skin testing positive to almond, hazelnut, coconut. 2021 bloodwork positive to scallop, tree nuts, peanuts and eggs. Borderline positive to coconut, clam, shrimp and lobster. Component panel for the peanuts and tree nuts showed - more likely to have anaphylactic reactions to peanuts, hazelnuts, walnuts, cashew and 2022 nuts. Interim history - tolerates baked eggs and not sure if he's eating things that may be triggering his eczema and allergy symptoms.  Continue strict avoidance of peanuts, tree nuts, eggs, shellfish, mollusks (clam, scallop), coconut for now.  Okay to eat baked egg items.  Once eczema/allergies better controlled - consider food challenge to coconut if interested first.  For mild symptoms you can take over the counter antihistamines such as Benadryl and monitor symptoms closely. If symptoms worsen or if you have severe symptoms including breathing issues, throat closure, significant swelling, whole body hives, severe diarrhea and vomiting, lightheadedness then inject epinephrine and seek immediate medical care afterwards.  Oral ulcer Managed by dentist - follow up with them and their recommendations.   Return in about 4 months (around 07/06/2021).  No orders of the defined types were placed in this encounter.  Lab Orders  Allergens w/Total IgE Area 2    Diagnostics: Spirometry:  Tracings reviewed. His effort: Good reproducible efforts. FVC: 4.43L FEV1: 3.78L FEV1/FVC ratio: 85% Interpretation: No overt abnormalities noted given today's efforts.  Please see scanned spirometry results for details.   Medication List:  Current Outpatient Medications  Medication Sig Dispense Refill   acetaminophen (TYLENOL) 500 MG tablet Take 1,000 mg by  mouth every 6 (six) hours as needed for moderate pain.     acyclovir (ZOVIRAX) 200 MG capsule Take by mouth.     albuterol (PROAIR HFA) 108 (90 Base) MCG/ACT inhaler Inhale 2 puffs into the lungs every 4 (four) hours as needed for wheezing or shortness of breath (coughing fits). 18 g 1   albuterol (PROVENTIL) (2.5 MG/3ML) 0.083% nebulizer solution Take 2.5 mg by nebulization every 6 (six) hours as needed for wheezing or shortness of breath.      cetirizine (ZYRTEC) 10 MG tablet Take 10 mg by mouth daily.     Ciclopirox 1 % shampoo Apply 1 Bottle topically at bedtime.     clindamycin (CLINDAGEL) 1 % gel Apply topically 2 (two) times daily.      desonide (DESOWEN) 0.05 % ointment Apply sparingly to affected areas twice daily as needed to the face and/or neck. 15 g 5   dexmethylphenidate (FOCALIN XR) 20 MG 24 hr capsule Take 20 mg by mouth every morning.     dexmethylphenidate (FOCALIN) 10 MG tablet Take 10 mg by mouth daily.     docusate sodium (COLACE) 100 MG capsule Take 100 mg by mouth daily as needed for mild constipation.     EPINEPHrine 0.3 mg/0.3 mL IJ SOAJ injection Inject 0.3  mLs (0.3 mg total) into the muscle daily as needed (allergic reaction). 1 Device 2   fluticasone (CUTIVATE) 0.05 % cream Apply topically.     fluticasone (FLONASE) 50 MCG/ACT nasal spray Place 1-2 sprays into both nostrils daily as needed for rhinitis. 16 g 5   hydrocortisone cream 0.5 % Apply topically.     ketoconazole (NIZORAL) 2 % cream Apply topically.     Lactobacillus-Inulin (CULTURELLE DIGESTIVE HEALTH PO) Take 1 capsule by mouth daily.     LORazepam (ATIVAN) 0.5 MG tablet Take 0.5 mg by mouth daily as needed for anxiety.     mupirocin ointment (BACTROBAN) 2 % SMARTSIG:1 Application Topical 2-3 Times Daily     Omega-3 Fatty Acids (FISH OIL) 645 MG CAPS Take 640 mg by mouth 3 (three) times daily.      Polyethyl Glycol-Propyl Glycol (SYSTANE ULTRA OP) Apply 1 drop to eye daily as needed.     sertraline (ZOLOFT)  50 MG tablet Take 50 mg by mouth daily.     terbinafine (LAMISIL) 1 % cream Apply 1 application topically 2 (two) times daily.     triamcinolone ointment (KENALOG) 0.1 % Apply sparingly to affected areas twice daily as needed, below the face. 30 g 5   No current facility-administered medications for this visit.   Allergies: Allergies  Allergen Reactions   Eggs Or Egg-Derived Products Anaphylaxis   Other Anaphylaxis    Allergic to ALL NUTS   Peanut Oil Anaphylaxis   Shellfish Allergy Anaphylaxis   Coconut Oil Rash   Haloperidol Other (See Comments)    Drowsy for days   Peanut-Containing Drug Products    I reviewed his past medical history, social history, family history, and environmental history and no significant changes have been reported from his previous visit.  Review of Systems  Constitutional:  Negative for appetite change, chills, fever and unexpected weight change.  HENT:  Negative for congestion and rhinorrhea.   Eyes:  Positive for itching.  Respiratory:  Negative for cough, chest tightness, shortness of breath and wheezing.   Gastrointestinal:  Negative for abdominal pain.  Skin:  Positive for rash.  Allergic/Immunologic: Positive for environmental allergies and food allergies.   Objective: BP 118/82   Pulse 76   Temp 97.6 F (36.4 C)   Resp 18   Ht 5\' 7"  (1.702 m)   Wt 161 lb 12.8 oz (73.4 kg)   SpO2 97%   BMI 25.34 kg/m  Body mass index is 25.34 kg/m. Physical Exam Vitals and nursing note reviewed. Exam conducted with a chaperone present.  Constitutional:      Appearance: He is well-developed.  HENT:     Head: Normocephalic and atraumatic.     Right Ear: Tympanic membrane and external ear normal.     Left Ear: Tympanic membrane and external ear normal.     Nose: No congestion or rhinorrhea.     Mouth/Throat:     Mouth: Mucous membranes are moist.     Pharynx: Oropharynx is clear. No oropharyngeal exudate or posterior oropharyngeal erythema.  Eyes:      Conjunctiva/sclera: Conjunctivae normal.  Cardiovascular:     Rate and Rhythm: Normal rate and regular rhythm.     Heart sounds: Normal heart sounds. No murmur heard. Pulmonary:     Effort: Pulmonary effort is normal.     Breath sounds: Normal breath sounds. No wheezing, rhonchi or rales.  Musculoskeletal:     Cervical back: Neck supple.  Skin:    General: Skin is  warm.     Findings: Rash present.     Comments: Dry, erythematous and swollen skin periorbitally b/l.   Neurological:     Mental Status: He is alert. Mental status is at baseline.  Previous notes and tests were reviewed. The plan was reviewed with the patient/family, and all questions/concerned were addressed.  It was my pleasure to see Ever today and participate in his care. Please feel free to contact me with any questions or concerns.  Sincerely,  Wyline Mood, DO Allergy & Immunology  [facility] office: [phone] [facility] office: [phone]

## 2021-03-06 NOTE — Patient Instructions (Addendum)
Allergic rhinitis 2018 allergy testing positive to dust mites, cat, dog, grass, mold, tree, weed, ragweed. Continue environmental control measures as below. Continue cetirizine 10mg  once a day and may take twice a day during flares.  Use Flonase (fluticasone) nasal spray 1 spray per nostril twice a day as needed for nasal congestion.  Nasal saline spray (i.e., Simply Saline) is recommended as needed especially after being outdoors for a prolonged time.  Consider allergy injections for long term control if above medications do not help the symptoms - handout given.  Let know when ready to start.  Get bloodwork:  We are ordering labs, so please allow 1-2 weeks for the results to come back. With the newly implemented Cures Act, the labs might be visible to you at the same time that they become visible to me. However, I will not address the results until all of the results are back, so please be patient.   Breathing:  May use albuterol rescue inhaler 2 puffs every 4 to 6 hours as needed for shortness of breath, chest tightness, coughing, and wheezing. May use albuterol rescue inhaler 2 puffs 5 to 15 minutes prior to strenuous physical activities. Monitor frequency of use.   Food allergy 2018 skin testing positive to almond, hazelnut, coconut.  2021 bloodwork positive to scallop, tree nuts, peanuts and eggs. Borderline positive to coconut, clam, shrimp and lobster.  Component panel for the peanuts and tree nuts showed - more likely to have anaphylactic reactions to peanuts, hazelnuts, walnuts, cashew and 2022 nuts. Continue strict avoidance of peanuts, tree nuts, eggs, shellfish, mollusks (clam, scallop), coconut for now.  Okay to eat baked egg items.  For mild symptoms you can take over the counter antihistamines such as Benadryl and monitor symptoms closely. If symptoms worsen or if you have severe symptoms including breathing issues, throat closure, significant swelling, whole body hives,  severe diarrhea and vomiting, lightheadedness then inject epinephrine and seek immediate medical care afterwards.  Skin: Start prednisone taper. Prednisone 10mg  tablets - take 2 tablets for 4 days then 1 tablet on day 5.   Continue proper skin care - use fragrance free and dye free shampoo such as vanicream.  Do not use head and shoulders. Moisturize the eyelid the best as possible.  May use desonide 0.05% ointment twice a day as needed for mild eczema flares - okay to use on the face, neck, groin area. Do not use more than 1 week at a time.  Follow up in 4 months or sooner if needed.  Skin care recommendations  Bath time: Always use lukewarm water. AVOID very hot or cold water. Keep bathing time to 5-10 minutes. Do NOT use bubble bath. Use a mild soap and use just enough to wash the dirty areas. Do NOT scrub skin vigorously.  After bathing, pat dry your skin with a towel. Do NOT rub or scrub the skin.  Moisturizers and prescriptions:  ALWAYS apply moisturizers immediately after bathing (within 3 minutes). This helps to lock-in moisture. Use the moisturizer several times a day over the whole body. Good summer moisturizers include: Aveeno, CeraVe, Cetaphil. Good winter moisturizers include: Aquaphor, Vaseline, Cerave, Cetaphil, Eucerin, Vanicream. When using moisturizers along with medications, the moisturizer should be applied about one hour after applying the medication to prevent diluting effect of the medication or moisturize around where you applied the medications. When not using medications, the moisturizer can be continued twice daily as maintenance.  Laundry and clothing: Avoid laundry products with added color  or perfumes. Use unscented hypo-allergenic laundry products such as Tide free, Cheer free & gentle, and All free and clear.  If the skin still seems dry or sensitive, you can try double-rinsing the clothes. Avoid tight or scratchy clothing such as wool. Do not use  fabric softeners or dyer sheets.

## 2021-03-06 NOTE — Assessment & Plan Note (Signed)
   Managed by dentist - follow up with them and their recommendations.

## 2021-03-06 NOTE — Assessment & Plan Note (Signed)
Coughing a few weeks ago but did not use albuterol.   Today's spirometry was unremarkable.  ACT score 25.  May use albuterol rescue inhaler 2 puffs every 4 to 6 hours as needed for shortness of breath, chest tightness, coughing, and wheezing. May use albuterol rescue inhaler 2 puffs 5 to 15 minutes prior to strenuous physical activities. Monitor frequency of use.

## 2021-03-06 NOTE — Assessment & Plan Note (Signed)
Past history - 2018 bloodwork - IgE 6992.  Positive to dust mites, cat, dog, grass, mold, tree, weed, ragweed. Does not like to use eye drops.  Interim history - itchy eyes which is making periorbital eczema worse.   Continue environmental control measures as below.  Continue cetirizine 10mg  once a day and may take twice a day during flares.  Use Flonase (fluticasone) nasal spray 1 spray per nostril twice a day as needed for nasal congestion.   Nasal saline spray (i.e., Simply Saline) is recommended as needed especially after being outdoors for a prolonged time.   Consider allergy injections for long term control if above medications do not help the symptoms - handout given.   Let know when ready to start.   This may be a good option as patient has difficulty doing eye drops and nasal sprays.   Patient lives in a group home and there would be significant time commitment required by the parents due to transportation.  . Get bloodwork for environmental allergy panel to see if patient's allergies have changed.

## 2021-03-06 NOTE — Assessment & Plan Note (Signed)
Past history - Dermatology recommended patch testing. Interim history - eczema flare periorbitally.   Start prednisone taper. Prednisone 10mg  tablets - take 2 tablets for 4 days then 1 tablet on day 5.   Continue proper skin care - use fragrance free and dye free shampoo such as Vanicream.   Do not use head and shoulders shampoo.   Moisturize the eyelid the best as possible.   May use desonide 0.05% ointment twice a day as needed for mild eczema flares - okay to use on the face, neck, groin area. Do not use more than 1 week at a time.

## 2021-03-06 NOTE — Assessment & Plan Note (Addendum)
Past history - 2018 skin testing positive to almond, hazelnut, coconut. 2021 bloodwork positive to scallop, tree nuts, peanuts and eggs. Borderline positive to coconut, clam, shrimp and lobster. Component panel for the peanuts and tree nuts showed - more likely to have anaphylactic reactions to peanuts, hazelnuts, walnuts, cashew and [place] nuts. Interim history - tolerates baked eggs and not sure if he's eating things that may be triggering his eczema and allergy symptoms.   Continue strict avoidance of peanuts, tree nuts, eggs, shellfish, mollusks (clam, scallop), coconut for now.   Okay to eat baked egg items.   Once eczema/allergies better controlled - consider food challenge to coconut if interested first.   For mild symptoms you can take over the counter antihistamines such as Benadryl and monitor symptoms closely. If symptoms worsen or if you have severe symptoms including breathing issues, throat closure, significant swelling, whole body hives, severe diarrhea and vomiting, lightheadedness then inject epinephrine and seek immediate medical care afterwards.

## 2021-03-09 LAB — ALLERGENS W/TOTAL IGE AREA 2
Alternaria Alternata IgE: 1 kU/L — AB
Aspergillus Fumigatus IgE: 0.15 kU/L — AB
Bermuda Grass IgE: 0.15 kU/L — AB
Cat Dander IgE: 0.19 kU/L — AB
Cedar, Mountain IgE: 0.3 kU/L — AB
Cladosporium Herbarum IgE: 1.42 kU/L — AB
Cockroach, German IgE: 0.2 kU/L — AB
Common Silver Birch IgE: 0.7 kU/L — AB
Cottonwood IgE: 0.45 kU/L — AB
D Farinae IgE: 44.8 kU/L — AB
D Pteronyssinus IgE: 22.7 kU/L — AB
Dog Dander IgE: 0.36 kU/L — AB
Elm, American IgE: 0.42 kU/L — AB
IgE (Immunoglobulin E), Serum: 3900 IU/mL — ABNORMAL HIGH (ref 6–495)
Johnson Grass IgE: 0.22 kU/L — AB
Maple/Box Elder IgE: 0.36 kU/L — AB
Mouse Urine IgE: <0.1 kU/L
Oak, White IgE: 2.24 kU/L — AB
Pecan, Hickory IgE: 8.42 kU/L — AB
Penicillium Chrysogen IgE: 0.13 kU/L — AB
Pigweed, Rough IgE: 0.25 kU/L — AB
Ragweed, Short IgE: 0.48 kU/L — AB
Sheep Sorrel IgE Qn: 0.19 kU/L — AB
Timothy Grass IgE: 0.18 kU/L — AB
White Mulberry IgE: 0.13 kU/L — AB

## 2021-03-27 ENCOUNTER — Other Ambulatory Visit: Payer: Self-pay | Admitting: Allergy

## 2021-03-27 DIAGNOSIS — J3089 Other allergic rhinitis: Secondary | ICD-10-CM

## 2021-03-28 DIAGNOSIS — B353 Tinea pedis: Secondary | ICD-10-CM | POA: Diagnosis not present

## 2021-03-28 DIAGNOSIS — L309 Dermatitis, unspecified: Secondary | ICD-10-CM | POA: Diagnosis not present

## 2021-03-28 NOTE — Progress Notes (Signed)
VIALS MADE. EXP 03-28-22 

## 2021-03-28 NOTE — Progress Notes (Signed)
Aeroallergen Immunotherapy   Ordering Provider: Dr. Wyline Mood   Patient Details  Name: Francisco Cisneros  MRN: [id number]  Date of Birth: [birth year]   Order 1 of 2   Vial Label: G-RW-W-T   0.3 ml (Volume)  BAU Concentration -- 7 Grass Mix* 100,000 ([address] [place], [place], [place], [place] R[place], RedTop, Sweet Vernal, Timothy)  0.3 ml (Volume)  BAU Concentration -- [place] 10,000  0.2 ml (Volume)  1:20 Concentration -- [place]  0.3 ml (Volume)  1:20 Concentration -- Ragweed Mix  0.5 ml (Volume)  1:20 Concentration -- Weed Mix*  0.5 ml (Volume)  1:20 Concentration -- Eastern 10 Tree Mix (also Sweet Gum)  0.2 ml (Volume)  1:20 Concentration -- Box Elder  0.2 ml (Volume)  1:10 Concentration -- Cedar, red  0.2 ml (Volume)  1:10 Concentration -- Pecan Pollen    2.7  ml Extract Subtotal  2.3  ml Diluent  5.0  ml Maintenance Total   Schedule:  B  Silver Vial (1:1,000,000): Schedule B (6 doses)  Blue Vial (1:100,000): Schedule B (6 doses)  Yellow Vial (1:10,000): Schedule B (6 doses)  Green Vial (1:1,000): Schedule B (6 doses)  Red Vial (1:100): Schedule A (10 doses)   Special Instructions: once per week

## 2021-03-28 NOTE — Progress Notes (Signed)
Aeroallergen Immunotherapy   Ordering Provider: Dr. Wyline Mood   Patient Details  Name: Francisco Cisneros  MRN: [id number]  Date of Birth: 04/29/1986   Order 2 of 2   Vial Label: M-Dm-C-D   0.2 ml (Volume)  1:20 Concentration -- Alternaria alternata  0.2 ml (Volume)  1:20 Concentration -- Cladosporium herbarum  0.5 ml (Volume)  1:10 Concentration -- Cat Hair  0.5 ml (Volume)  1:10 Concentration -- Dog Epithelia  0.5 ml (Volume)   AU Concentration -- Mite Mix (DF 5,000 & DP 5,000)    1.9  ml Extract Subtotal  3.1  ml Diluent  5.0  ml Maintenance Total   Schedule:  B  Silver Vial (1:1,000,000): Schedule B (6 doses)  Blue Vial (1:100,000): Schedule B (6 doses)  Yellow Vial (1:10,000): Schedule B (6 doses)  Green Vial (1:1,000): Schedule B (6 doses)  Red Vial (1:100): Schedule A (10 doses)   Special Instructions: once per week

## 2021-03-29 DIAGNOSIS — J301 Allergic rhinitis due to pollen: Secondary | ICD-10-CM | POA: Diagnosis not present

## 2021-03-30 DIAGNOSIS — J3089 Other allergic rhinitis: Secondary | ICD-10-CM | POA: Diagnosis not present

## 2021-04-11 DIAGNOSIS — F419 Anxiety disorder, unspecified: Secondary | ICD-10-CM | POA: Diagnosis not present

## 2021-04-12 ENCOUNTER — Ambulatory Visit (INDEPENDENT_AMBULATORY_CARE_PROVIDER_SITE_OTHER): Payer: Medicare Other

## 2021-04-12 ENCOUNTER — Other Ambulatory Visit: Payer: Self-pay

## 2021-04-12 DIAGNOSIS — J309 Allergic rhinitis, unspecified: Secondary | ICD-10-CM

## 2021-04-17 ENCOUNTER — Ambulatory Visit (INDEPENDENT_AMBULATORY_CARE_PROVIDER_SITE_OTHER): Payer: Medicare Other | Admitting: *Deleted

## 2021-04-17 DIAGNOSIS — J309 Allergic rhinitis, unspecified: Secondary | ICD-10-CM

## 2021-04-21 DIAGNOSIS — Z23 Encounter for immunization: Secondary | ICD-10-CM | POA: Diagnosis not present

## 2021-04-24 ENCOUNTER — Ambulatory Visit (INDEPENDENT_AMBULATORY_CARE_PROVIDER_SITE_OTHER): Payer: Medicare Other | Admitting: *Deleted

## 2021-04-24 DIAGNOSIS — J309 Allergic rhinitis, unspecified: Secondary | ICD-10-CM | POA: Diagnosis not present

## 2021-05-01 ENCOUNTER — Ambulatory Visit (INDEPENDENT_AMBULATORY_CARE_PROVIDER_SITE_OTHER): Payer: Medicare Other

## 2021-05-01 DIAGNOSIS — J309 Allergic rhinitis, unspecified: Secondary | ICD-10-CM | POA: Diagnosis not present

## 2021-05-08 ENCOUNTER — Ambulatory Visit (INDEPENDENT_AMBULATORY_CARE_PROVIDER_SITE_OTHER): Payer: Medicare Other

## 2021-05-08 DIAGNOSIS — J309 Allergic rhinitis, unspecified: Secondary | ICD-10-CM

## 2021-05-11 DIAGNOSIS — Z23 Encounter for immunization: Secondary | ICD-10-CM | POA: Diagnosis not present

## 2021-05-15 ENCOUNTER — Ambulatory Visit (INDEPENDENT_AMBULATORY_CARE_PROVIDER_SITE_OTHER): Payer: Medicare Other

## 2021-05-15 DIAGNOSIS — J309 Allergic rhinitis, unspecified: Secondary | ICD-10-CM

## 2021-05-22 ENCOUNTER — Ambulatory Visit (INDEPENDENT_AMBULATORY_CARE_PROVIDER_SITE_OTHER): Payer: Medicare Other | Admitting: *Deleted

## 2021-05-22 DIAGNOSIS — J309 Allergic rhinitis, unspecified: Secondary | ICD-10-CM | POA: Diagnosis not present

## 2021-05-29 ENCOUNTER — Ambulatory Visit (INDEPENDENT_AMBULATORY_CARE_PROVIDER_SITE_OTHER): Payer: Medicare Other

## 2021-05-29 DIAGNOSIS — J309 Allergic rhinitis, unspecified: Secondary | ICD-10-CM | POA: Diagnosis not present

## 2021-06-05 ENCOUNTER — Ambulatory Visit (INDEPENDENT_AMBULATORY_CARE_PROVIDER_SITE_OTHER): Payer: Medicare Other

## 2021-06-05 DIAGNOSIS — J309 Allergic rhinitis, unspecified: Secondary | ICD-10-CM

## 2021-06-08 DIAGNOSIS — L989 Disorder of the skin and subcutaneous tissue, unspecified: Secondary | ICD-10-CM | POA: Diagnosis not present

## 2021-06-08 DIAGNOSIS — L309 Dermatitis, unspecified: Secondary | ICD-10-CM | POA: Diagnosis not present

## 2021-06-12 ENCOUNTER — Ambulatory Visit (INDEPENDENT_AMBULATORY_CARE_PROVIDER_SITE_OTHER): Payer: Medicare Other | Admitting: *Deleted

## 2021-06-12 DIAGNOSIS — J309 Allergic rhinitis, unspecified: Secondary | ICD-10-CM | POA: Diagnosis not present

## 2021-06-19 ENCOUNTER — Ambulatory Visit (INDEPENDENT_AMBULATORY_CARE_PROVIDER_SITE_OTHER): Payer: Medicare Other

## 2021-06-19 DIAGNOSIS — J309 Allergic rhinitis, unspecified: Secondary | ICD-10-CM

## 2021-06-26 ENCOUNTER — Ambulatory Visit (INDEPENDENT_AMBULATORY_CARE_PROVIDER_SITE_OTHER): Payer: Medicare Other | Admitting: *Deleted

## 2021-06-26 DIAGNOSIS — J309 Allergic rhinitis, unspecified: Secondary | ICD-10-CM | POA: Diagnosis not present

## 2021-06-27 DIAGNOSIS — L209 Atopic dermatitis, unspecified: Secondary | ICD-10-CM | POA: Diagnosis not present

## 2021-06-27 DIAGNOSIS — Z79899 Other long term (current) drug therapy: Secondary | ICD-10-CM | POA: Diagnosis not present

## 2021-07-03 ENCOUNTER — Ambulatory Visit (INDEPENDENT_AMBULATORY_CARE_PROVIDER_SITE_OTHER): Payer: Medicare Other

## 2021-07-03 DIAGNOSIS — J309 Allergic rhinitis, unspecified: Secondary | ICD-10-CM | POA: Diagnosis not present

## 2021-07-10 ENCOUNTER — Other Ambulatory Visit: Payer: Self-pay

## 2021-07-10 ENCOUNTER — Ambulatory Visit (INDEPENDENT_AMBULATORY_CARE_PROVIDER_SITE_OTHER): Payer: Medicare Other | Admitting: Allergy

## 2021-07-10 ENCOUNTER — Ambulatory Visit: Payer: Self-pay

## 2021-07-10 ENCOUNTER — Encounter: Payer: Self-pay | Admitting: Allergy

## 2021-07-10 VITALS — BP 128/86 | HR 75 | Temp 98.2°F | Resp 18 | Ht 67.01 in | Wt 160.8 lb

## 2021-07-10 DIAGNOSIS — J4530 Mild persistent asthma, uncomplicated: Secondary | ICD-10-CM

## 2021-07-10 DIAGNOSIS — H1013 Acute atopic conjunctivitis, bilateral: Secondary | ICD-10-CM

## 2021-07-10 DIAGNOSIS — J309 Allergic rhinitis, unspecified: Secondary | ICD-10-CM

## 2021-07-10 DIAGNOSIS — L2089 Other atopic dermatitis: Secondary | ICD-10-CM

## 2021-07-10 DIAGNOSIS — J302 Other seasonal allergic rhinitis: Secondary | ICD-10-CM

## 2021-07-10 DIAGNOSIS — J3089 Other allergic rhinitis: Secondary | ICD-10-CM

## 2021-07-10 DIAGNOSIS — T7800XD Anaphylactic reaction due to unspecified food, subsequent encounter: Secondary | ICD-10-CM

## 2021-07-10 DIAGNOSIS — H1010 Acute atopic conjunctivitis, unspecified eye: Secondary | ICD-10-CM

## 2021-07-10 NOTE — Assessment & Plan Note (Signed)
Past history - 2018 bloodwork - IgE 6992.  Positive to dust mites, cat, dog, grass, mold, tree, weed, ragweed. Does not like to use eye drops. 2022 bloodwork positive to dust mites, mold, tree pollen and ragweed pollen. Borderline positive to weed pollen, grass pollen, cockroach, cat and dog. Interim history - started AIT on 04/07/2021 (G-Rw-W-T & M-Dm-C-D) with some localized reactions. Concerned if it's flaring her eczema.  Continue environmental control measures as below.  Continue Xyzal 5mg  once a day and may take twice a day during flares.  Use Flonase (fluticasone) nasal spray 1 spray per nostril twice a day as needed for nasal congestion.   Nasal saline spray (i.e., Simply Saline) is recommended as needed especially after being outdoors for a prolonged time.   Continue allergy injections - given today.   Dad does not think eczema flares after the injections. Advised to monitor symptoms.

## 2021-07-10 NOTE — Assessment & Plan Note (Addendum)
Past history - Dermatology recommended patch testing. Interim history - eczema flaring on the face/neck. Follows with dermatology. No using proper shampoo.   Follow up with dermatology as scheduled - patch testing.   Discussed that Dupixent is a good idea and dermatology is in the process of doing the PA.   Distribution is still concerning for contact dermatitis.   Continue proper skin care - use fragrance free and dye free shampoo such as vanicream.   Do not use head and shoulders or J&J shampoo.   Moisturize the eyelid the best as possible.   May use desonide 0.05% ointment twice a day as needed for mild eczema flares - okay to use on the face, neck, groin area. Do not use more than 1 week at a time.  Use Eucrisa (crisaborole) 2% ointment twice a day on mild rash flares on the face and body. This is a non-steroid ointment. Samples given.

## 2021-07-10 NOTE — Progress Notes (Signed)
Follow Up Note  RE: Francisco Cisneros MRN: [id number] DOB: September 12, 1985 Date of Office Visit: 07/10/2021  Referring provider: Farris Has, MD Primary care provider: Farris Has, MD  Chief Complaint: Asthma, Cough, Allergic Reaction (Has been having a reaction from allergy shots on his face, neck and a little in the crease of right leg. Itchy.  Never a reaction near the sight. ), and Eczema (Was recommended dupixent, xyzal (no change from zyrtec), and patch test 12.19.22/Went to the dermatologist 06/08/21 and was given kenalog in his buttock to help clear the eczema /)  History of Present Illness: I had the pleasure of seeing Francisco Cisneros for a follow up visit at the [facility] on 07/10/2021. He is a 35 y.o. male, who is being followed for asthma, allergic rhinitis on AIT, atopic dermatitis and food allergy. His previous allergy office visit was on 03/06/2021 with Dr. Selena Batten. Today is a regular follow up visit. He is accompanied today by his mother and father who provided/contributed to the history.   Mild persistent asthma  Stable.  Seasonal and perennial allergic rhinitis Started AIT on 04/12/2021 with G-Rw-W-T & M-Dm-C-D with some localized reactions.  Patient's eczema started to flare in October and concerned if it's related to the allergy shots. Some itchy eyes. Taking Flonase 1 spray per nostril twice a day. No nosebleeds.   Eczema  Went to see dermatology on November 3rd and got a steroid injection which only helped for a few weeks. Using desonide and fluticasone with some benefit. Mom also picked up Elidel.  Phill Mutter with some benefit behind the knees.  Scheduled for patch testing on 12/19.  Using J&J baby shampoo sometimes and head/shoulders once per week.  Eczema rash mainly on the face and neck only.  Not using any shaving cream. Using fragrance free and dye free laundry products.   Taking Xyzal 5mg  QHS and not sure if it helps more than the zyrtec  with the itching.  Considering starting Dupixent through dermatology. Had skin biopsy in the past as they were concerned for lupus as well.   Food allergy Avoiding peanuts, tree nuts, eggs, shellfish, mollusks (clam, scallop), coconut for now.   Francisco Cisneros is a 35 y.o. male with: Seasonal and perennial allergic rhinoconjunctivitis Past history - 2018 bloodwork - IgE 6992.  Positive to dust mites, cat, dog, grass, mold, tree, weed, ragweed. Does not like to use eye drops. 2022 bloodwork positive to dust mites, mold, tree pollen and ragweed pollen.  Borderline positive to weed pollen, grass pollen, cockroach, cat and dog. Interim history - started AIT on 04/07/2021 (G-Rw-W-T & M-Dm-C-D) with some localized reactions. Concerned if it's flaring her eczema. Continue environmental control measures as below. Continue Xyzal 5mg  once a day and may take twice a day during flares. Use Flonase (fluticasone) nasal spray 1 spray per nostril twice a day as needed for nasal congestion.  Nasal saline spray (i.e., Simply Saline) is recommended as needed especially after being outdoors for a prolonged time.  Continue allergy injections - given today.  Dad does not think eczema flares after the injections. Advised to monitor symptoms.   Other atopic dermatitis Past history - Dermatology recommended patch testing. Interim history - eczema flaring on the face/neck. Follows with dermatology. No using proper shampoo.  Follow up with dermatology as scheduled - patch testing.  Discussed that Dupixent is a good idea and dermatology is in the process of doing the PA.  Distribution is still concerning for contact  dermatitis.  Continue proper skin care - use fragrance free and dye free shampoo such as vanicream.  Do not use head and shoulders or J&J shampoo.  Moisturize the eyelid the best as possible.  May use desonide 0.05% ointment twice a day as needed for mild eczema flares - okay to use on the face, neck, groin area.  Do not use more than 1 week at a time. Use Eucrisa (crisaborole) 2% ointment twice a day on mild rash flares on the face and body. This is a non-steroid ointment. Samples given.   Mild persistent asthma without complication Stable.  May use albuterol rescue inhaler 2 puffs every 4 to 6 hours as needed for shortness of breath, chest tightness, coughing, and wheezing. May use albuterol rescue inhaler 2 puffs 5 to 15 minutes prior to strenuous physical activities. Monitor frequency of use.  Get spirometry at next visit.  Anaphylactic shock due to adverse food reaction Past history - 2018 skin testing positive to almond, hazelnut, coconut. 2021 bloodwork positive to scallop, tree nuts, peanuts and eggs. Borderline positive to coconut, clam, shrimp and lobster. Component panel for the peanuts and tree nuts showed - more likely to have anaphylactic reactions to peanuts, hazelnuts, walnuts, cashew and [place] nuts. Interim history - tolerates baked eggs.  Continue strict avoidance of peanuts, tree nuts, eggs, shellfish, mollusks (clam, scallop), coconut for now.  Okay to eat baked egg items.  Once eczema/allergies better controlled - consider food challenge to coconut if interested first.  For mild symptoms you can take over the counter antihistamines such as Benadryl and monitor symptoms closely. If symptoms worsen or if you have severe symptoms including breathing issues, throat closure, significant swelling, whole body hives, severe diarrhea and vomiting, lightheadedness then inject epinephrine and seek immediate medical care afterwards.  Return in about 4 months (around 11/08/2021).  No orders of the defined types were placed in this encounter.  Lab Orders  No laboratory test(s) ordered today    Diagnostics: None.    Medication List:  Current Outpatient Medications  Medication Sig Dispense Refill   acetaminophen (TYLENOL) 500 MG tablet Take 1,000 mg by mouth every 6 (six) hours as needed for  moderate pain.     acyclovir (ZOVIRAX) 200 MG capsule Take by mouth.     albuterol (PROAIR HFA) 108 (90 Base) MCG/ACT inhaler Inhale 2 puffs into the lungs every 4 (four) hours as needed for wheezing or shortness of breath (coughing fits). 18 g 1   albuterol (PROVENTIL) (2.5 MG/3ML) 0.083% nebulizer solution Take 2.5 mg by nebulization every 6 (six) hours as needed for wheezing or shortness of breath.      Ciclopirox 1 % shampoo Apply 1 Bottle topically at bedtime.     clindamycin (CLINDAGEL) 1 % gel Apply topically 2 (two) times daily.      desonide (DESOWEN) 0.05 % ointment Apply sparingly to affected areas twice daily as needed to the face and/or neck. 15 g 5   dexmethylphenidate (FOCALIN XR) 20 MG 24 hr capsule Take 20 mg by mouth every morning.     dexmethylphenidate (FOCALIN) 10 MG tablet Take 10 mg by mouth daily.     docusate sodium (COLACE) 100 MG capsule Take 100 mg by mouth daily as needed for mild constipation.     EPINEPHrine 0.3 mg/0.3 mL IJ SOAJ injection Inject 0.3 mLs (0.3 mg total) into the muscle daily as needed (allergic reaction). 1 Device 2   fluticasone (CUTIVATE) 0.05 % cream Apply topically.  fluticasone (FLONASE) 50 MCG/ACT nasal spray Place 1-2 sprays into both nostrils daily as needed for rhinitis. 16 g 5   hydrocortisone cream 0.5 % Apply topically.     ketoconazole (NIZORAL) 2 % cream Apply topically.     Lactobacillus-Inulin (CULTURELLE DIGESTIVE HEALTH PO) Take 1 capsule by mouth daily.     levocetirizine (XYZAL) 5 MG tablet Take 5 mg by mouth every evening.     LORazepam (ATIVAN) 0.5 MG tablet Take 0.5 mg by mouth daily as needed for anxiety.     mupirocin ointment (BACTROBAN) 2 % SMARTSIG:1 Application Topical 2-3 Times Daily     Omega-3 Fatty Acids (FISH OIL) 645 MG CAPS Take 640 mg by mouth 3 (three) times daily.      Polyethyl Glycol-Propyl Glycol (SYSTANE ULTRA OP) Apply 1 drop to eye daily as needed.     sertraline (ZOLOFT) 50 MG tablet Take 50 mg by  mouth daily.     terbinafine (LAMISIL) 1 % cream Apply 1 application topically 2 (two) times daily.     triamcinolone ointment (KENALOG) 0.1 % Apply sparingly to affected areas twice daily as needed, below the face. 30 g 5   FOCALIN XR 10 MG 24 hr capsule Take 10 mg by mouth every morning.     No current facility-administered medications for this visit.   Allergies: Allergies  Allergen Reactions   Eggs Or Egg-Derived Products Anaphylaxis   Other Anaphylaxis    Allergic to ALL NUTS   Peanut Oil Anaphylaxis   Shellfish Allergy Anaphylaxis   Coconut Oil Rash   Haloperidol Other (See Comments)    Drowsy for days   Peanut-Containing Drug Products    I reviewed his past medical history, social history, family history, and environmental history and no significant changes have been reported from his previous visit.  Review of Systems  Constitutional:  Negative for appetite change, chills, fever and unexpected weight change.  HENT:  Negative for congestion and rhinorrhea.   Eyes:  Positive for itching.  Respiratory:  Negative for cough, chest tightness, shortness of breath and wheezing.   Gastrointestinal:  Negative for abdominal pain.  Skin:  Positive for rash.  Allergic/Immunologic: Positive for environmental allergies and food allergies.   Objective: BP 128/86   Pulse 75   Temp 98.2 F (36.8 C)   Resp 18   Ht 5' 7.01" (1.702 m)   Wt 160 lb 12.8 oz (72.9 kg)   SpO2 99%   BMI 25.18 kg/m  Body mass index is 25.18 kg/m. Physical Exam Vitals and nursing note reviewed. Exam conducted with a chaperone present.  Constitutional:      Appearance: He is well-developed.  HENT:     Head: Normocephalic and atraumatic.     Right Ear: Tympanic membrane and external ear normal.     Left Ear: Tympanic membrane and external ear normal.     Nose: Nose normal.     Mouth/Throat:     Mouth: Mucous membranes are moist.     Pharynx: Oropharynx is clear. No oropharyngeal exudate or posterior  oropharyngeal erythema.  Eyes:     Conjunctiva/sclera: Conjunctivae normal.  Cardiovascular:     Rate and Rhythm: Normal rate and regular rhythm.     Heart sounds: Normal heart sounds. No murmur heard. Pulmonary:     Effort: Pulmonary effort is normal.     Breath sounds: Normal breath sounds. No wheezing, rhonchi or rales.  Musculoskeletal:     Cervical back: Neck supple.  Skin:  General: Skin is warm.     Findings: Rash present.     Comments: Erythematous patches on anterior neck and circular erythematous rashes on the forehead.   Neurological:     Mental Status: He is alert. Mental status is at baseline.   Previous notes and tests were reviewed. The plan was reviewed with the patient/family, and all questions/concerned were addressed.  It was my pleasure to see Juanluis today and participate in his care. Please feel free to contact me with any questions or concerns.  Sincerely,  Wyline Mood, DO Allergy & Immunology  [facility] office: [phone] [facility] office: [phone]

## 2021-07-10 NOTE — Patient Instructions (Addendum)
Allergic rhinitis 2018 allergy testing positive to dust mites, cat, dog, grass, mold, tree, weed, ragweed. Continue environmental control measures as below. Continue Xyzal 5mg  once a day and may take twice a day during flares.  Use Flonase (fluticasone) nasal spray 1 spray per nostril twice a day as needed for nasal congestion.  Nasal saline spray (i.e., Simply Saline) is recommended as needed especially after being outdoors for a prolonged time.  Continue allergy injections - given today.   Breathing:  May use albuterol rescue inhaler 2 puffs every 4 to 6 hours as needed for shortness of breath, chest tightness, coughing, and wheezing. May use albuterol rescue inhaler 2 puffs 5 to 15 minutes prior to strenuous physical activities. Monitor frequency of use.   Food allergy 2018 skin testing positive to almond, hazelnut, coconut.  2021 bloodwork positive to scallop, tree nuts, peanuts and eggs. Borderline positive to coconut, clam, shrimp and lobster.  Component panel for the peanuts and tree nuts showed - more likely to have anaphylactic reactions to peanuts, hazelnuts, walnuts, cashew and 2022 nuts. Continue strict avoidance of peanuts, tree nuts, eggs, shellfish, mollusks (clam, scallop), coconut for now.  Okay to eat baked egg items.  For mild symptoms you can take over the counter antihistamines such as Benadryl and monitor symptoms closely. If symptoms worsen or if you have severe symptoms including breathing issues, throat closure, significant swelling, whole body hives, severe diarrhea and vomiting, lightheadedness then inject epinephrine and seek immediate medical care afterwards.  Skin: Follow up with dermatology as scheduled - patch testing.  Dupixent would help with the eczema.  Continue proper skin care - use fragrance free and dye free shampoo such as vanicream.  Do not use head and shoulders or J&J shampoo.  Moisturize the eyelid the best as possible.  May use desonide 0.05%  ointment twice a day as needed for mild eczema flares - okay to use on the face, neck, groin area. Do not use more than 1 week at a time. Use Eucrisa (crisaborole) 2% ointment twice a day on mild rash flares on the face and body. This is a non-steroid ointment. Samples given.  If it burns, place the medication in the refrigerator.  Apply a thin layer of moisturizer and then apply the Eucrisa on top of it.  Follow up in 4 months or sooner if needed.  Skin care recommendations  Bath time: Always use lukewarm water. AVOID very hot or cold water. Keep bathing time to 5-10 minutes. Do NOT use bubble bath. Use a mild soap and use just enough to wash the dirty areas. Do NOT scrub skin vigorously.  After bathing, pat dry your skin with a towel. Do NOT rub or scrub the skin.  Moisturizers and prescriptions:  ALWAYS apply moisturizers immediately after bathing (within 3 minutes). This helps to lock-in moisture. Use the moisturizer several times a day over the whole body. Good summer moisturizers include: Aveeno, CeraVe, Cetaphil. Good winter moisturizers include: Aquaphor, Vaseline, Cerave, Cetaphil, Eucerin, Vanicream. When using moisturizers along with medications, the moisturizer should be applied about one hour after applying the medication to prevent diluting effect of the medication or moisturize around where you applied the medications. When not using medications, the moisturizer can be continued twice daily as maintenance.  Laundry and clothing: Avoid laundry products with added color or perfumes. Use unscented hypo-allergenic laundry products such as Tide free, Cheer free & gentle, and All free and clear.  If the skin still seems dry or sensitive, you  can try double-rinsing the clothes. Avoid tight or scratchy clothing such as wool. Do not use fabric softeners or dyer sheets.

## 2021-07-10 NOTE — Assessment & Plan Note (Signed)
Past history - 2018 skin testing positive to almond, hazelnut, coconut. 2021 bloodwork positive to scallop, tree nuts, peanuts and eggs. Borderline positive to coconut, clam, shrimp and lobster. Component panel for the peanuts and tree nuts showed - more likely to have anaphylactic reactions to peanuts, hazelnuts, walnuts, cashew and [place] nuts. Interim history - tolerates baked eggs.   Continue strict avoidance of peanuts, tree nuts, eggs, shellfish, mollusks (clam, scallop), coconut for now.   Okay to eat baked egg items.   Once eczema/allergies better controlled - consider food challenge to coconut if interested first.   For mild symptoms you can take over the counter antihistamines such as Benadryl and monitor symptoms closely. If symptoms worsen or if you have severe symptoms including breathing issues, throat closure, significant swelling, whole body hives, severe diarrhea and vomiting, lightheadedness then inject epinephrine and seek immediate medical care afterwards.

## 2021-07-10 NOTE — Assessment & Plan Note (Signed)
Stable.   May use albuterol rescue inhaler 2 puffs every 4 to 6 hours as needed for shortness of breath, chest tightness, coughing, and wheezing. May use albuterol rescue inhaler 2 puffs 5 to 15 minutes prior to strenuous physical activities. Monitor frequency of use.   Get spirometry at next visit.

## 2021-07-17 ENCOUNTER — Ambulatory Visit (INDEPENDENT_AMBULATORY_CARE_PROVIDER_SITE_OTHER): Payer: Medicare Other

## 2021-07-17 DIAGNOSIS — J309 Allergic rhinitis, unspecified: Secondary | ICD-10-CM

## 2021-07-24 ENCOUNTER — Ambulatory Visit (INDEPENDENT_AMBULATORY_CARE_PROVIDER_SITE_OTHER): Payer: Medicare Other

## 2021-07-24 DIAGNOSIS — J309 Allergic rhinitis, unspecified: Secondary | ICD-10-CM | POA: Diagnosis not present

## 2021-07-24 DIAGNOSIS — L239 Allergic contact dermatitis, unspecified cause: Secondary | ICD-10-CM | POA: Diagnosis not present

## 2021-07-24 NOTE — Progress Notes (Signed)
Father was concerned about patient's rash on his lower back and arm. Patient apparently has been itching all night. He is home for 2 weeks now. They have patch test appointment at the dermatologist in the afternoon and starting on Dupixent next week.  They stopped using the topical steroid creams.   Advised dad to bring up the rash with the dermatologist. Monitor the rash. Hopefully the patch testing will help identify any contact triggers and the Dupixent will help with the itching.  If no improvement, will consider taking a break from the allergy injections to see if they are contributing to his rash or not.

## 2021-07-25 DIAGNOSIS — F419 Anxiety disorder, unspecified: Secondary | ICD-10-CM | POA: Diagnosis not present

## 2021-07-28 DIAGNOSIS — Z23 Encounter for immunization: Secondary | ICD-10-CM | POA: Diagnosis not present

## 2021-07-28 DIAGNOSIS — L2084 Intrinsic (allergic) eczema: Secondary | ICD-10-CM | POA: Diagnosis not present

## 2021-08-01 DIAGNOSIS — L2084 Intrinsic (allergic) eczema: Secondary | ICD-10-CM | POA: Diagnosis not present

## 2021-08-03 ENCOUNTER — Ambulatory Visit (INDEPENDENT_AMBULATORY_CARE_PROVIDER_SITE_OTHER): Payer: Medicare Other | Admitting: *Deleted

## 2021-08-03 ENCOUNTER — Telehealth: Payer: Self-pay | Admitting: *Deleted

## 2021-08-03 DIAGNOSIS — J309 Allergic rhinitis, unspecified: Secondary | ICD-10-CM | POA: Diagnosis not present

## 2021-08-03 NOTE — Telephone Encounter (Signed)
Patients allergy flow sheet has been updated to reflect these changes, advised dad that we will let him know when they come in for his next injection.

## 2021-08-03 NOTE — Telephone Encounter (Signed)
Patient has started Dupixent injections every 2 weeks. Dad stated that he is not having any issues with his injections. He is wondering if it is ok for him to get his Dupixent and allergy injections on the same day.

## 2021-08-03 NOTE — Telephone Encounter (Signed)
Okay to give together as he lives far away. Thank you.

## 2021-08-08 ENCOUNTER — Ambulatory Visit (INDEPENDENT_AMBULATORY_CARE_PROVIDER_SITE_OTHER): Payer: Medicare Other | Admitting: *Deleted

## 2021-08-08 DIAGNOSIS — J309 Allergic rhinitis, unspecified: Secondary | ICD-10-CM | POA: Diagnosis not present

## 2021-08-10 DIAGNOSIS — I4510 Unspecified right bundle-branch block: Secondary | ICD-10-CM | POA: Diagnosis not present

## 2021-08-10 DIAGNOSIS — E785 Hyperlipidemia, unspecified: Secondary | ICD-10-CM | POA: Diagnosis not present

## 2021-08-10 DIAGNOSIS — L2084 Intrinsic (allergic) eczema: Secondary | ICD-10-CM | POA: Diagnosis not present

## 2021-08-10 DIAGNOSIS — D582 Other hemoglobinopathies: Secondary | ICD-10-CM | POA: Diagnosis not present

## 2021-08-10 DIAGNOSIS — Z91010 Allergy to peanuts: Secondary | ICD-10-CM | POA: Diagnosis not present

## 2021-08-10 DIAGNOSIS — J4520 Mild intermittent asthma, uncomplicated: Secondary | ICD-10-CM | POA: Diagnosis not present

## 2021-08-10 DIAGNOSIS — Z0000 Encounter for general adult medical examination without abnormal findings: Secondary | ICD-10-CM | POA: Diagnosis not present

## 2021-08-10 DIAGNOSIS — J309 Allergic rhinitis, unspecified: Secondary | ICD-10-CM | POA: Diagnosis not present

## 2021-08-10 DIAGNOSIS — L309 Dermatitis, unspecified: Secondary | ICD-10-CM | POA: Diagnosis not present

## 2021-08-10 DIAGNOSIS — F411 Generalized anxiety disorder: Secondary | ICD-10-CM | POA: Diagnosis not present

## 2021-08-17 ENCOUNTER — Ambulatory Visit (INDEPENDENT_AMBULATORY_CARE_PROVIDER_SITE_OTHER): Payer: Medicare Other

## 2021-08-17 DIAGNOSIS — L2084 Intrinsic (allergic) eczema: Secondary | ICD-10-CM | POA: Diagnosis not present

## 2021-08-17 DIAGNOSIS — L219 Seborrheic dermatitis, unspecified: Secondary | ICD-10-CM | POA: Diagnosis not present

## 2021-08-17 DIAGNOSIS — T7840XD Allergy, unspecified, subsequent encounter: Secondary | ICD-10-CM | POA: Diagnosis not present

## 2021-08-17 DIAGNOSIS — J309 Allergic rhinitis, unspecified: Secondary | ICD-10-CM | POA: Diagnosis not present

## 2021-08-25 ENCOUNTER — Ambulatory Visit (INDEPENDENT_AMBULATORY_CARE_PROVIDER_SITE_OTHER): Payer: Medicare Other

## 2021-08-25 DIAGNOSIS — J309 Allergic rhinitis, unspecified: Secondary | ICD-10-CM

## 2021-08-31 ENCOUNTER — Ambulatory Visit (INDEPENDENT_AMBULATORY_CARE_PROVIDER_SITE_OTHER): Payer: Medicare Other

## 2021-08-31 DIAGNOSIS — J309 Allergic rhinitis, unspecified: Secondary | ICD-10-CM | POA: Diagnosis not present

## 2021-09-04 ENCOUNTER — Ambulatory Visit (INDEPENDENT_AMBULATORY_CARE_PROVIDER_SITE_OTHER): Payer: Medicare Other

## 2021-09-04 DIAGNOSIS — J309 Allergic rhinitis, unspecified: Secondary | ICD-10-CM | POA: Diagnosis not present

## 2021-09-11 ENCOUNTER — Ambulatory Visit (INDEPENDENT_AMBULATORY_CARE_PROVIDER_SITE_OTHER): Payer: Medicare Other

## 2021-09-11 DIAGNOSIS — J309 Allergic rhinitis, unspecified: Secondary | ICD-10-CM | POA: Diagnosis not present

## 2021-09-18 ENCOUNTER — Ambulatory Visit (INDEPENDENT_AMBULATORY_CARE_PROVIDER_SITE_OTHER): Payer: Medicare Other

## 2021-09-18 DIAGNOSIS — J309 Allergic rhinitis, unspecified: Secondary | ICD-10-CM

## 2021-09-25 ENCOUNTER — Ambulatory Visit (INDEPENDENT_AMBULATORY_CARE_PROVIDER_SITE_OTHER): Payer: Medicare Other

## 2021-09-25 DIAGNOSIS — J309 Allergic rhinitis, unspecified: Secondary | ICD-10-CM | POA: Diagnosis not present

## 2021-10-09 ENCOUNTER — Telehealth: Payer: Self-pay | Admitting: *Deleted

## 2021-10-09 NOTE — Telephone Encounter (Signed)
Patients mother came in with him and stated that he had a "reaction" to the Dupixent and he broke out in a rash on his face. He was not given his allergy injection that day. The rash has healed now with the use of Desonide on his face but his mother showed me today that he now has a rash on his neck and collar bone region, she stated it seemed as if the Desonide wasn't helping, she did state that she was going to try alternating with the [place]. I advised that since I was not sure if it was a different reaction I did not feel comfortable giving him his allergy injections today, unfortunately there were no more openings to work him in today and he has a very busy schedule, I was able to get him scheduled to come in and see Dr. Maurine Minister Wednesday evening. Patients mom did state that he is participating in a felting class that involves wool and he is allergic to wool as well.  ?

## 2021-10-09 NOTE — Telephone Encounter (Signed)
Thank you Ashleigh for the heads up!

## 2021-10-10 NOTE — Progress Notes (Signed)
FOLLOW UP Date of Service/Encounter:  10/11/21   Subjective:  Francisco Cisneros (DOB: [birth year]) is a 36 y.o. male who [facility] in re-evaluation of the following: Acute visit for rash History obtained from: chart review and patient and mother.  For Review, LV was on 07/10/21  with Dr. Selena Batten.    He is followed for asthma, allergic rhinitis on immunotherapy, atopic dermatitis and food allergy.  Pertinent history/diagnostics: Chronic rhinitis:  - 2018 bloodwork - IgE 6992.  Positive to dust mites, cat, dog, grass, mold, tree, weed, ragweed. Does not like to use eye drops.  - 2022 bloodwork positive to dust mites, mold, tree pollen and ragweed pollen.  Borderline positive to weed pollen, grass pollen, cockroach, cat and dog. - Started AIT on 04/12/2021 with G-[id number] & M-Dm-C-D with some localized reactions.  Eczema/dermatitis:  -Patient's eczema started to flare in October and concerned if it's related to the allergy shots. -Eczema followed in managed by dermatology on Dupixent with plans for patch testing. Multiple food allergies:   -- 2018 skin testing positive to almond, hazelnut, coconut.  - 2021 bloodwork positive to scallop, tree nuts, peanuts and eggs. Borderline positive to coconut, clam, shrimp and lobster. Component panel for the peanuts and tree nuts showed - more likely to have anaphylactic reactions to peanuts, hazelnuts, walnuts, cashew and [place] nuts. - tolerates baked eggs.   Today presents for follow-up concerning a rash which he is concerned may be related to his allergy injections or Dupixent injections.  Patient received last injection on 09/25/21. He received Dupixent injection on 10/02/21 and about 2 hours later, he developed a flare of rashes on her face.  It had improved when he came to his allergy shot appointment later that day, but not resolved.   They held his allergy shots on this day until he could see a provider. A few  days later, he developed a new flare on his neck on both sides.  Both rashes lasted for several days and eventually improved after using desonide.   He is doing a lot of [place] wool [place] which he had stopped doing for some time since it was irritating to his skin, but he is using the wool more often recently  They question whether this could be contributing to his rash.   They have had patch testing at dermatology.  He was allergic to some metals including gold and chromium.   He was allergic to 5 things overall.  Some things were suspected to be in his garden gloves. Mom is not sure if she has provided [place] with a copy of these results.   Mom feels his breathing has much improved since starting Dupixent.   Allergies as of 10/11/2021       Reactions   Eggs Or Egg-derived Products Anaphylaxis   Other Anaphylaxis   Allergic to ALL NUTS   Peanut Oil Anaphylaxis   Shellfish Allergy Anaphylaxis   Coconut Oil Rash   Haloperidol Other (See Comments)   Drowsy for days   Peanut-containing Drug Products         Medication List        Accurate as of October 11, 2021  5:12 PM. If you have any questions, ask your nurse or doctor.          acetaminophen 500 MG tablet Commonly known as: TYLENOL Take 1,000 mg by mouth every 6 (six) hours as needed for moderate pain.  acyclovir 200 MG capsule Commonly known as: ZOVIRAX Take by mouth.   AeroChamber Plus Flo-Vu Large Misc   albuterol (2.5 MG/3ML) 0.083% nebulizer solution Commonly known as: PROVENTIL Take 2.5 mg by nebulization every 6 (six) hours as needed for wheezing or shortness of breath.   albuterol 108 (90 Base) MCG/ACT inhaler Commonly known as: ProAir HFA Inhale 2 puffs into the lungs every 4 (four) hours as needed for wheezing or shortness of breath (coughing fits).   Ciclopirox 1 % shampoo Apply 1 Bottle topically at bedtime.   clindamycin 1 % gel Commonly known as: CLINDAGEL Apply topically 2 (two) times  daily.   CULTURELLE DIGESTIVE HEALTH PO Take 1 capsule by mouth daily.   desonide 0.05 % ointment Commonly known as: DESOWEN Apply sparingly to affected areas twice daily as needed to the face and/or neck.   dexmethylphenidate 10 MG tablet Commonly known as: FOCALIN Take 10 mg by mouth daily.   dexmethylphenidate 20 MG 24 hr capsule Commonly known as: FOCALIN XR Take 20 mg by mouth every morning.   docusate sodium 100 MG capsule Commonly known as: COLACE Take 100 mg by mouth daily as needed for mild constipation.   Dupixent 300 MG/2ML Sopn Generic drug: Dupilumab Inject into the skin.   EPINEPHrine 0.3 mg/0.3 mL Soaj injection Commonly known as: EPI-PEN Inject 0.3 mLs (0.3 mg total) into the muscle daily as needed (allergic reaction).   Eucrisa 2 % Oint Generic drug: Crisaborole Apply topically 2 (two) times daily.   Fish Oil 645 MG Caps Take 640 mg by mouth 3 (three) times daily.   fluticasone 0.05 % cream Commonly known as: CUTIVATE Apply topically.   fluticasone 50 MCG/ACT nasal spray Commonly known as: FLONASE Place 1-2 sprays into both nostrils daily as needed for rhinitis.   hydrocortisone cream 0.5 % Apply topically.   ketoconazole 2 % cream Commonly known as: NIZORAL Apply topically.   levocetirizine 5 MG tablet Commonly known as: XYZAL Take 5 mg by mouth every evening.   LORazepam 0.5 MG tablet Commonly known as: ATIVAN Take 0.5 mg by mouth daily as needed for anxiety.   mupirocin ointment 2 % Commonly known as: BACTROBAN SMARTSIG:1 Application Topical 2-3 Times Daily   sertraline 50 MG tablet Commonly known as: ZOLOFT Take 50 mg by mouth daily.   SYSTANE ULTRA OP Apply 1 drop to eye daily as needed.   terbinafine 1 % cream Commonly known as: LAMISIL Apply 1 application. topically 2 (two) times daily.   triamcinolone ointment 0.1 % Commonly known as: KENALOG Apply sparingly to affected areas twice daily as needed, below the face.    VANICREAM CLEANSER EX       Past Medical History:  Diagnosis Date   ADHD (attention deficit hyperactivity disorder)    Asthma    Autistic disorder    Eczema    Past Surgical History:  Procedure Laterality Date   WISDOM TOOTH EXTRACTION  2009   Otherwise, there have been no changes to his past medical history, surgical history, family history, or social history.  ROS: All others negative except as noted per HPI.   Objective:  BP 112/72    Temp 97.8 F (36.6 C)    Resp 18    Ht 5\' 7"  (1.702 m)    Wt 170 lb (77.1 kg)    SpO2 97%    BMI 26.63 kg/m  Body mass index is 26.63 kg/m. Physical Exam: General Appearance:  Alert, cooperative, no distress, appears stated age  Head:  Normocephalic, without obvious abnormality, atraumatic  Eyes:  Conjunctiva clear, EOM's intact  Nose: Nares normal, hypertrophic turbinates, normal mucosa, and no visible anterior polyps  Throat: Lips, tongue normal; teeth and gums normal, normal posterior oropharynx  Neck: Supple, symmetrical  Lungs:   clear to auscultation bilaterally, Respirations unlabored, no coughing  Heart:  regular rate and rhythm and no murmur, Appears well perfused  Extremities: No edema  Skin: Skin color, texture, turgor normal, mildly erythematous patches on base of neck bilaterally, a few patches on abdomen, faint erythema on forehead  Neurologic: No gross deficits   Assessment/Plan  Doubt rash related to allergy injections-okay to continue to receive. He is outdoors more and using wool products which could be contributing to what looks like atopic/contact dermatitis flare on his face and neck-now resolving with topical steroids ointments.  Would continue Dupixent and discuss with Dermatology if he continues to flare for consideration of other therapies.   Allergic rhinitis 2018 allergy testing positive to dust mites, cat, dog, grass, mold, tree, weed, ragweed. Continue environmental control measures as below. Continue Xyzal  5mg  once a day and may take twice a day during flares.  Use Flonase (fluticasone) nasal spray 1 spray per nostril twice a day as needed for nasal congestion.  Nasal saline spray (i.e., Simply Saline) is recommended as needed especially after being outdoors for a prolonged time.  Continue allergy injections - given today. Do not suspect rash related to injections.   Breathing:  May use albuterol rescue inhaler 2 puffs every 4 to 6 hours as needed for shortness of breath, chest tightness, coughing, and wheezing. May use albuterol rescue inhaler 2 puffs 5 to 15 minutes prior to strenuous physical activities. Monitor frequency of use.   Food allergy 2018 skin testing positive to almond, hazelnut, coconut.  2021 bloodwork positive to scallop, tree nuts, peanuts and eggs. Borderline positive to coconut, clam, shrimp and lobster.  Component panel for the peanuts and tree nuts showed - more likely to have anaphylactic reactions to peanuts, hazelnuts, walnuts, cashew and 2022 nuts. Continue strict avoidance of peanuts, tree nuts, eggs, shellfish, mollusks (clam, scallop), coconut for now.  Okay to eat baked egg items.  For mild symptoms you can take over the counter antihistamines such as Benadryl and monitor symptoms closely. If symptoms worsen or if you have severe symptoms including breathing issues, throat closure, significant swelling, whole body hives, severe diarrhea and vomiting, lightheadedness then inject epinephrine and seek immediate medical care afterwards.  Skin: Follow up with dermatology as scheduled  Continue Dupixent  Continue proper skin care - use fragrance free and dye free shampoo such as vanicream.  Do not use head and shoulders or J&J shampoo.  Moisturize the eyelid the best as possible.  May use desonide 0.05% ointment twice a day as needed for mild eczema flares - okay to use on the face, neck, groin area. Do not use more than 1 week at a time. Use Eucrisa (crisaborole) 2%  ointment twice a day on mild rash flares on the face and body. This is a non-steroid ointment. Samples given.  If it burns, place the medication in the refrigerator.  Apply a thin layer of moisturizer and then apply the Eucrisa on top of it. Avoid wool (use gloves, limit exposure) and avoid all ingredients listed in patch testing Bring a copy of results of patch testing  Follow up in 4 months or sooner if needed.  [place], MD  [facility]

## 2021-10-10 NOTE — Telephone Encounter (Signed)
Thanks for letting me know that.  It sounds like there are many reasons besides our injections for the rash, so I will probably clear him for his shot and let Derm manage the skin!

## 2021-10-11 ENCOUNTER — Encounter: Payer: Self-pay | Admitting: Internal Medicine

## 2021-10-11 ENCOUNTER — Other Ambulatory Visit: Payer: Self-pay

## 2021-10-11 ENCOUNTER — Ambulatory Visit (INDEPENDENT_AMBULATORY_CARE_PROVIDER_SITE_OTHER): Payer: Medicare Other | Admitting: Internal Medicine

## 2021-10-11 ENCOUNTER — Ambulatory Visit: Payer: Self-pay

## 2021-10-11 VITALS — BP 112/72 | Temp 97.8°F | Resp 18 | Ht 67.0 in | Wt 170.0 lb

## 2021-10-11 DIAGNOSIS — J309 Allergic rhinitis, unspecified: Secondary | ICD-10-CM | POA: Diagnosis not present

## 2021-10-11 DIAGNOSIS — L2089 Other atopic dermatitis: Secondary | ICD-10-CM | POA: Diagnosis not present

## 2021-10-11 DIAGNOSIS — T7800XD Anaphylactic reaction due to unspecified food, subsequent encounter: Secondary | ICD-10-CM

## 2021-10-11 DIAGNOSIS — J302 Other seasonal allergic rhinitis: Secondary | ICD-10-CM

## 2021-10-11 DIAGNOSIS — J4530 Mild persistent asthma, uncomplicated: Secondary | ICD-10-CM

## 2021-10-11 NOTE — Patient Instructions (Addendum)
Allergic rhinitis ?2018 allergy testing positive to dust mites, cat, dog, grass, mold, tree, weed, ragweed. ?Continue environmental control measures as below. ?Continue Xyzal 5mg  once a day and may take twice a day during flares. ? ?Use Flonase (fluticasone) nasal spray 1 spray per nostril twice a day as needed for nasal congestion.  ?Nasal saline spray (i.e., Simply Saline) is recommended as needed especially after being outdoors for a prolonged time.  ?Continue allergy injections - given today.  ? ?Breathing:  ?May use albuterol rescue inhaler 2 puffs every 4 to 6 hours as needed for shortness of breath, chest tightness, coughing, and wheezing. May use albuterol rescue inhaler 2 puffs 5 to 15 minutes prior to strenuous physical activities. Monitor frequency of use.  ? ?Food allergy ?2018 skin testing positive to almond, hazelnut, coconut.  ?2021 bloodwork positive to scallop, tree nuts, peanuts and eggs. Borderline positive to coconut, clam, shrimp and lobster.  ?Component panel for the peanuts and tree nuts showed - more likely to have anaphylactic reactions to peanuts, hazelnuts, walnuts, cashew and 2022 nuts. ?Continue strict avoidance of peanuts, tree nuts, eggs, shellfish, mollusks (clam, scallop), coconut for now.  ?Okay to eat baked egg items.  ?For mild symptoms you can take over the counter antihistamines such as Benadryl and monitor symptoms closely. If symptoms worsen or if you have severe symptoms including breathing issues, throat closure, significant swelling, whole body hives, severe diarrhea and vomiting, lightheadedness then inject epinephrine and seek immediate medical care afterwards. ? ?Skin: ?Follow up with dermatology as scheduled  ?Continue Dupixent ? ?Continue proper skin care - use fragrance free and dye free shampoo such as vanicream.  ?Do not use head and shoulders or J&J shampoo.  ?Moisturize the eyelid the best as possible.  ?May use desonide 0.05% ointment twice a day as needed for  mild eczema flares - okay to use on the face, neck, groin area. Do not use more than 1 week at a time. ?Use Eucrisa (crisaborole) 2% ointment twice a day on mild rash flares on the face and body. This is a non-steroid ointment. Samples given.  ?If it burns, place the medication in the refrigerator.  ?Apply a thin layer of moisturizer and then apply the Eucrisa on top of it. ?Avoid wool (use gloves, limit exposure) and avoid all ingredients listed in patch testing ?Bring a copy of results of patch testing ? ?Follow up in 4 months or sooner if needed. ?

## 2021-10-16 ENCOUNTER — Ambulatory Visit (INDEPENDENT_AMBULATORY_CARE_PROVIDER_SITE_OTHER): Payer: Medicare Other

## 2021-10-16 DIAGNOSIS — J309 Allergic rhinitis, unspecified: Secondary | ICD-10-CM

## 2021-10-23 ENCOUNTER — Ambulatory Visit (INDEPENDENT_AMBULATORY_CARE_PROVIDER_SITE_OTHER): Payer: Medicare Other

## 2021-10-23 DIAGNOSIS — J309 Allergic rhinitis, unspecified: Secondary | ICD-10-CM | POA: Diagnosis not present

## 2021-10-30 ENCOUNTER — Ambulatory Visit (INDEPENDENT_AMBULATORY_CARE_PROVIDER_SITE_OTHER): Payer: Medicare Other

## 2021-10-30 DIAGNOSIS — J309 Allergic rhinitis, unspecified: Secondary | ICD-10-CM | POA: Diagnosis not present

## 2021-11-06 ENCOUNTER — Ambulatory Visit (INDEPENDENT_AMBULATORY_CARE_PROVIDER_SITE_OTHER): Payer: Medicare Other

## 2021-11-06 DIAGNOSIS — J309 Allergic rhinitis, unspecified: Secondary | ICD-10-CM | POA: Diagnosis not present

## 2021-11-07 DIAGNOSIS — L209 Atopic dermatitis, unspecified: Secondary | ICD-10-CM | POA: Diagnosis not present

## 2021-11-13 ENCOUNTER — Ambulatory Visit (INDEPENDENT_AMBULATORY_CARE_PROVIDER_SITE_OTHER): Payer: Medicare Other

## 2021-11-13 DIAGNOSIS — J309 Allergic rhinitis, unspecified: Secondary | ICD-10-CM | POA: Diagnosis not present

## 2021-11-14 DIAGNOSIS — F419 Anxiety disorder, unspecified: Secondary | ICD-10-CM | POA: Diagnosis not present

## 2021-11-20 ENCOUNTER — Ambulatory Visit (INDEPENDENT_AMBULATORY_CARE_PROVIDER_SITE_OTHER): Payer: Medicare Other | Admitting: Allergy

## 2021-11-20 ENCOUNTER — Ambulatory Visit: Payer: Self-pay

## 2021-11-20 ENCOUNTER — Encounter: Payer: Self-pay | Admitting: Allergy

## 2021-11-20 VITALS — BP 122/82 | HR 78 | Temp 97.8°F | Resp 16 | Ht 67.0 in | Wt 170.0 lb

## 2021-11-20 DIAGNOSIS — H1013 Acute atopic conjunctivitis, bilateral: Secondary | ICD-10-CM

## 2021-11-20 DIAGNOSIS — J4530 Mild persistent asthma, uncomplicated: Secondary | ICD-10-CM

## 2021-11-20 DIAGNOSIS — H1010 Acute atopic conjunctivitis, unspecified eye: Secondary | ICD-10-CM

## 2021-11-20 DIAGNOSIS — J309 Allergic rhinitis, unspecified: Secondary | ICD-10-CM

## 2021-11-20 DIAGNOSIS — T7800XD Anaphylactic reaction due to unspecified food, subsequent encounter: Secondary | ICD-10-CM

## 2021-11-20 DIAGNOSIS — L2089 Other atopic dermatitis: Secondary | ICD-10-CM

## 2021-11-20 MED ORDER — EPINEPHRINE 0.3 MG/0.3ML IJ SOAJ: 0.3000 mg | Freq: Every day | INTRAMUSCULAR | 2 refills | Status: AC | PRN

## 2021-11-20 NOTE — Patient Instructions (Addendum)
Allergic rhinitis ?2018 allergy testing positive to dust mites, cat, dog, grass, mold, tree, weed, ragweed. ?Continue environmental control measures.  ?Continue Xyzal 5mg  OR zyrtec 10mg  once a day and may take twice a day during flares. ?Use Flonase (fluticasone) nasal spray 1 spray per nostril 1-2 times a day as needed for nasal congestion.  ?Nasal saline spray (i.e., Simply Saline) is recommended as needed especially after being outdoors for a prolonged time.  ?Continue allergy injections - given today.  ? ?Breathing:  ?May use albuterol rescue inhaler 2 puffs every 4 to 6 hours as needed for shortness of breath, chest tightness, coughing, and wheezing. May use albuterol rescue inhaler 2 puffs 5 to 15 minutes prior to strenuous physical activities. Monitor frequency of use.  ? ?Food allergy ?2018 skin testing positive to almond, hazelnut, coconut.  ?2021 bloodwork positive to scallop, tree nuts, peanuts and eggs. Borderline positive to coconut, clam, shrimp and lobster.  ?Component panel for the peanuts and tree nuts showed - more likely to have anaphylactic reactions to peanuts, hazelnuts, walnuts, cashew and [place] nuts. ?Continue strict avoidance of peanuts, tree nuts, eggs, shellfish, mollusks (clam, scallop), coconut for now.  ?Okay to eat baked egg items.  ?For mild symptoms you can take over the counter antihistamines such as Benadryl and monitor symptoms closely. If symptoms worsen or if you have severe symptoms including breathing issues, throat closure, significant swelling, whole body hives, severe diarrhea and vomiting, lightheadedness then inject epinephrine and seek immediate medical care afterwards. ? ?Skin: ?Follow up with dermatology as scheduled. ?Avoid items that were positive on patch testing - I will email the safe list to you.  ?Continue Dupixent injections every 2 weeks.  ? ?Continue proper skin care - use fragrance free and dye free shampoo such as vanicream.  ?Moisturize the eyelid the  best as possible.  ?May use desonide 0.05% ointment twice a day as needed for mild eczema flares - okay to use on the face, neck, groin area. Do not use more than 1 week at a time. ?Use Eucrisa (crisaborole) 2% ointment twice a day on mild rash flares on the face and body. This is a non-steroid ointment.  ?If it burns, place the medication in the refrigerator.  ?Apply a thin layer of moisturizer and then apply the Eucrisa on top of it. ? ?Follow up in 6 months or sooner if needed. ? ?Reducing Pollen Exposure ?Pollen seasons: trees (spring), grass (summer) and ragweed/weeds (fall). ?Keep windows closed in your home and car to lower pollen exposure.  ?Install air conditioning in the bedroom and throughout the house if possible.  ?Avoid going out in dry windy days - especially early morning. ?Pollen counts are highest between 5 - 10 AM and on dry, hot and windy days.  ?Save outside activities for late afternoon or after a heavy rain, when pollen levels are lower.  ?Avoid mowing of grass if you have grass pollen allergy. ?Be aware that pollen can also be transported indoors on people and pets.  ?Dry your clothes in an automatic dryer rather than hanging them outside where they might collect pollen.  ?Rinse hair and eyes before bedtime. ? ? ?Skin care recommendations ? ?Bath time: ?Always use lukewarm water. AVOID very hot or cold water. ?Keep bathing time to 5-10 minutes. ?Do NOT use bubble bath. ?Use a mild soap and use just enough to wash the dirty areas. ?Do NOT scrub skin vigorously.  ?After bathing, pat dry your skin with a towel. Do NOT  rub or scrub the skin. ? ?Moisturizers and prescriptions:  ?ALWAYS apply moisturizers immediately after bathing (within 3 minutes). This helps to lock-in moisture. ?Use the moisturizer several times a day over the whole body. ?Good summer moisturizers include: Aveeno, CeraVe, Cetaphil. ?Good winter moisturizers include: Aquaphor, Vaseline, Cerave, Cetaphil, Eucerin,  Vanicream. ?When using moisturizers along with medications, the moisturizer should be applied about one hour after applying the medication to prevent diluting effect of the medication or moisturize around where you applied the medications. When not using medications, the moisturizer can be continued twice daily as maintenance. ? ?Laundry and clothing: ?Avoid laundry products with added color or perfumes. ?Use unscented hypo-allergenic laundry products such as Tide free, Cheer free & gentle, and All free and clear.  ?If the skin still seems dry or sensitive, you can try double-rinsing the clothes. ?Avoid tight or scratchy clothing such as wool. ?Do not use fabric softeners or dyer sheets. ?

## 2021-11-20 NOTE — Assessment & Plan Note (Signed)
Past history - 2018 bloodwork - IgE 6992.  Positive to dust mites, cat, dog, grass, mold, tree, weed, ragweed. Does not like to use eye drops. 2022 bloodwork positive to dust mites, mold, tree pollen and ragweed pollen. ?Borderline positive to weed pollen, grass pollen, cockroach, cat and dog. Started AIT on 04/07/2021 (G-Rw-W-T & M-Dm-C-D) ?Interim history - improvement in symptoms this spring. Some localized reaction with AIT. ?? Continue environmental control measures.  ?? Continue Xyzal 5mg  OR zyrtec 10mg  once a day and may take twice a day during flares. ?? Use Flonase (fluticasone) nasal spray 1 spray per nostril 1-2 times a day as needed for nasal congestion.  ?? Nasal saline spray (i.e., Simply Saline) is recommended as needed especially after being outdoors for a prolonged time.  ?? Continue allergy injections - given today.  ?

## 2021-11-20 NOTE — Progress Notes (Signed)
? ?Follow Up Note ? ?RE: Latarius Breitbach MRN: [id number] DOB: September 22, 1985 ?Date of Office Visit: 11/20/2021 ? ?Referring provider: [name], MD ?Primary care provider: [name], MD ? ?Chief Complaint: Follow-up ? ?History of Present Illness: ?I had the pleasure of seeing Francisco Cisneros for a follow up visit at the [facility] on 11/20/2021. He is a 36 y.o. male, who is being followed for allergic rhinoconjunctivitis on AIT, food allergy, atopic dermatitis, asthma. His previous allergy office visit was on 10/11/2021 with Dr. Simona Huh. Today is a regular follow up visit. He is accompanied today by his mother and father who provided/contributed to the history.  ? ?Allergic rhinitis ?Patient gets some localized burning with the injections. ?Symptoms improved since on allergy injections and parents noted significantly less itchy skin, improved conjunctivitis.  ? ?Taking Xyzal once a day, Flonase 1 spray per nostril once a day.  ? ?Breathing:  ?Denies any SOB, coughing, wheezing, chest tightness, nocturnal awakenings, ER/urgent care visits or prednisone use since the last visit. ?No albuterol use recently.  ? ?Food allergy ?Avoiding peanuts, tree nuts, eggs, shellfish, mollusks (clam, scallop), coconut. ?Eating limited baked egg items.  ? ?Skin: ?True patch test was positive to gold, carba mix, epoxy resin, potassium dichromate, p-tert-butylphenol formaldehyde resin.  ?Still doing Dupixent injections every 2 weeks by dad - managed by derm. ?One time broke out in a facial rash after Dupixent.  ? ?Using desonide and Eucrisa as needed. ? ?Assessment and Plan: ?Thornell is a 36 y.o. male with: ?Seasonal and perennial allergic rhinoconjunctivitis ?Past history - 2018 bloodwork - IgE 6992.  Positive to dust mites, cat, dog, grass, mold, tree, weed, ragweed. Does not like to use eye drops. 2022 bloodwork positive to dust mites, mold, tree pollen and ragweed pollen.  Borderline positive to weed pollen,  grass pollen, cockroach, cat and dog. Started AIT on 04/07/2021 (G-Rw-W-T & M-Dm-C-D) ?Interim history - improvement in symptoms this spring. Some localized reaction with AIT. ?Continue environmental control measures.  ?Continue Xyzal 5mg  OR zyrtec 10mg  once a day and may take twice a day during flares. ?Use Flonase (fluticasone) nasal spray 1 spray per nostril 1-2 times a day as needed for nasal congestion.  ?Nasal saline spray (i.e., Simply Saline) is recommended as needed especially after being outdoors for a prolonged time.  ?Continue allergy injections - given today.  ? ?Mild persistent asthma without complication ?Well-controlled. No albuterol use.  ?May use albuterol rescue inhaler 2 puffs every 4 to 6 hours as needed for shortness of breath, chest tightness, coughing, and wheezing. May use albuterol rescue inhaler 2 puffs 5 to 15 minutes prior to strenuous physical activities. Monitor frequency of use.  ?Get spirometry at next visit. ? ?Anaphylactic reaction due to food, subsequent encounter ?Past history - 2018 skin testing positive to almond, hazelnut, coconut. 2021 bloodwork positive to scallop, tree nuts, peanuts and eggs. Borderline positive to coconut, clam, shrimp and lobster. Component panel for the peanuts and tree nuts showed - more likely to have anaphylactic reactions to peanuts, hazelnuts, walnuts, cashew and [place] nuts. ?Interim history - tolerates baked eggs. No reactions.  ?Continue strict avoidance of peanuts, tree nuts, eggs, shellfish, mollusks (clam, scallop), coconut for now.  ?Okay to eat baked egg items.  ?Once eczema/allergies better controlled - consider food challenge to coconut if interested first.  ?For mild symptoms you can take over the counter antihistamines such as Benadryl and monitor symptoms closely. If symptoms worsen or if you have severe symptoms  including breathing issues, throat closure, significant swelling, whole body hives, severe diarrhea and vomiting,  lightheadedness then inject epinephrine and seek immediate medical care afterwards. ? ?Other atopic dermatitis ?Past history - managed by derm.  ?Interim history - 2023 patch testing positive to gold, carba mix, epoxy resin, potassium dichromate, p-tert-butylphenol formaldehyde resin (done by derm). Still uses oxyclean on clothes.  ?Follow up with dermatology as scheduled. ?Avoid items that were positive on patch testing - safe list emailed to both patient and mother.   ?Continue Dupixent injections every 2 weeks.  ?Continue proper skin care - use fragrance free and dye free shampoo such as vanicream.  ?Moisturize the eyelid the best as possible.  ?May use desonide 0.05% ointment twice a day as needed for mild eczema flares - okay to use on the face, neck, groin area. Do not use more than 1 week at a time. ?Use Eucrisa (crisaborole) 2% ointment twice a day on mild rash flares on the face and body. This is a non-steroid ointment.  ? ?Return in about 6 months (around 05/22/2022). ? ?Meds ordered this encounter  ?Medications  ? EPINEPHrine 0.3 mg/0.3 mL IJ SOAJ injection  ?  Sig: Inject 0.3 mg into the muscle daily as needed (allergic reaction).  ?  Dispense:  1 each  ?  Refill:  2  ?  Pt doesn't want filled but hold on file.  ? ?Lab Orders  ?No laboratory test(s) ordered today  ? ? ?Diagnostics: ?None.  ? ?Medication List:  ?Current Outpatient Medications  ?Medication Sig Dispense Refill  ? acetaminophen (TYLENOL) 500 MG tablet Take 1,000 mg by mouth every 6 (six) hours as needed for moderate pain.    ? albuterol (PROAIR HFA) 108 (90 Base) MCG/ACT inhaler Inhale 2 puffs into the lungs every 4 (four) hours as needed for wheezing or shortness of breath (coughing fits). 18 g 1  ? mupirocin ointment (BACTROBAN) 2 % SMARTSIG:1 Application Topical 2-3 Times Daily    ? Omega-3 Fatty Acids (FISH OIL) 645 MG CAPS Take 640 mg by mouth 3 (three) times daily.     ? Polyethyl Glycol-Propyl Glycol (SYSTANE ULTRA OP) Apply 1 drop to  eye daily as needed.    ? sertraline (ZOLOFT) 50 MG tablet Take 50 mg by mouth daily.    ? Soap & Cleansers [facility] CLEANSER EX)     ? acyclovir (ZOVIRAX) 200 MG capsule Take by mouth. (Patient not taking: Reported on 10/11/2021)    ? albuterol (PROVENTIL) (2.5 MG/3ML) 0.083% nebulizer solution Take 2.5 mg by nebulization every 6 (six) hours as needed for wheezing or shortness of breath.  (Patient not taking: Reported on 10/11/2021)    ? Ciclopirox 1 % shampoo Apply 1 Bottle topically at bedtime. (Patient not taking: Reported on 10/11/2021)    ? clindamycin (CLINDAGEL) 1 % gel Apply topically 2 (two) times daily.  (Patient not taking: Reported on 10/11/2021)    ? desonide (DESOWEN) 0.05 % ointment Apply sparingly to affected areas twice daily as needed to the face and/or neck. 15 g 5  ? dexmethylphenidate (FOCALIN XR) 20 MG 24 hr capsule Take 20 mg by mouth every morning.    ? dexmethylphenidate (FOCALIN) 10 MG tablet Take 10 mg by mouth daily.    ? docusate sodium (COLACE) 100 MG capsule Take 100 mg by mouth daily as needed for mild constipation.    ? DUPIXENT 300 MG/2ML SOPN Inject into the skin.    ? EPINEPHrine 0.3 mg/0.3 mL IJ SOAJ injection  Inject 0.3 mg into the muscle daily as needed (allergic reaction). 1 each 2  ? EUCRISA 2 % OINT Apply topically 2 (two) times daily.    ? fluticasone (CUTIVATE) 0.05 % cream Apply topically. (Patient not taking: Reported on 10/11/2021)    ? fluticasone (FLONASE) 50 MCG/ACT nasal spray Place 1-2 sprays into both nostrils daily as needed for rhinitis. 16 g 5  ? hydrocortisone cream 0.5 % Apply topically.    ? ketoconazole (NIZORAL) 2 % cream Apply topically.    ? Lactobacillus-Inulin (CULTURELLE DIGESTIVE HEALTH PO) Take 1 capsule by mouth daily.    ? levocetirizine (XYZAL) 5 MG tablet Take 5 mg by mouth every evening. (Patient not taking: Reported on 10/11/2021)    ? LORazepam (ATIVAN) 0.5 MG tablet Take 0.5 mg by mouth daily as needed for anxiety.    ? Spacer/Aero-Holding Chambers  (AEROCHAMBER PLUS FLO-VU LARGE) MISC  (Patient not taking: Reported on 11/20/2021)    ? terbinafine (LAMISIL) 1 % cream Apply 1 application. topically 2 (two) times daily. (Patient not taking: Reported on 11/20/2021)

## 2021-11-20 NOTE — Assessment & Plan Note (Signed)
Well-controlled. No albuterol use.  ?? May use albuterol rescue inhaler 2 puffs every 4 to 6 hours as needed for shortness of breath, chest tightness, coughing, and wheezing. May use albuterol rescue inhaler 2 puffs 5 to 15 minutes prior to strenuous physical activities. Monitor frequency of use.  ?? Get spirometry at next visit. ?

## 2021-11-20 NOTE — Assessment & Plan Note (Signed)
Past history - 2018 skin testing positive to almond, hazelnut, coconut. 2021 bloodwork positive to scallop, tree nuts, peanuts and eggs. Borderline positive to coconut, clam, shrimp and lobster. Component panel for the peanuts and tree nuts showed - more likely to have anaphylactic reactions to peanuts, hazelnuts, walnuts, cashew and [place] nuts. ?Interim history - tolerates baked eggs. No reactions.  ?? Continue strict avoidance of peanuts, tree nuts, eggs, shellfish, mollusks (clam, scallop), coconut for now.  ?? Okay to eat baked egg items.  ?? Once eczema/allergies better controlled - consider food challenge to coconut if interested first.  ?? For mild symptoms you can take over the counter antihistamines such as Benadryl and monitor symptoms closely. If symptoms worsen or if you have severe symptoms including breathing issues, throat closure, significant swelling, whole body hives, severe diarrhea and vomiting, lightheadedness then inject epinephrine and seek immediate medical care afterwards. ?

## 2021-11-20 NOTE — Assessment & Plan Note (Signed)
Past history - managed by derm.  ?Interim history - 2023 patch testing positive to gold, carba mix, epoxy resin, potassium dichromate, p-tert-butylphenol formaldehyde resin (done by derm). Still uses oxyclean on clothes.  ?? Follow up with dermatology as scheduled. ?? Avoid items that were positive on patch testing - safe list emailed to both patient and mother.   ?? Continue Dupixent injections every 2 weeks.  ?? Continue proper skin care - use fragrance free and dye free shampoo such as vanicream.  ?? Moisturize the eyelid the best as possible.  ?? May use desonide 0.05% ointment twice a day as needed for mild eczema flares - okay to use on the face, neck, groin area. Do not use more than 1 week at a time. ?? Use Eucrisa (crisaborole) 2% ointment twice a day on mild rash flares on the face and body. This is a non-steroid ointment.  ?

## 2021-11-24 ENCOUNTER — Other Ambulatory Visit: Payer: Self-pay | Admitting: Allergy

## 2021-11-24 NOTE — Telephone Encounter (Signed)
Is this a medication you would want to refill for the patient? ?

## 2021-11-27 ENCOUNTER — Ambulatory Visit (INDEPENDENT_AMBULATORY_CARE_PROVIDER_SITE_OTHER): Payer: Medicare Other

## 2021-11-27 DIAGNOSIS — J309 Allergic rhinitis, unspecified: Secondary | ICD-10-CM | POA: Diagnosis not present

## 2021-11-27 NOTE — Telephone Encounter (Signed)
Called and spoke with patients mother, she states that they usually keep it on hand for when he gets athletes foot which occurs during the summer, she noticed that the tube that she has currently is expired and was wondering if she would refill it for them.  ?

## 2021-12-04 ENCOUNTER — Ambulatory Visit (INDEPENDENT_AMBULATORY_CARE_PROVIDER_SITE_OTHER): Payer: Medicare Other

## 2021-12-04 DIAGNOSIS — J309 Allergic rhinitis, unspecified: Secondary | ICD-10-CM

## 2021-12-11 ENCOUNTER — Ambulatory Visit (INDEPENDENT_AMBULATORY_CARE_PROVIDER_SITE_OTHER): Payer: Medicare Other

## 2021-12-11 DIAGNOSIS — J309 Allergic rhinitis, unspecified: Secondary | ICD-10-CM

## 2021-12-18 ENCOUNTER — Ambulatory Visit (INDEPENDENT_AMBULATORY_CARE_PROVIDER_SITE_OTHER): Payer: Medicare Other

## 2021-12-18 DIAGNOSIS — J309 Allergic rhinitis, unspecified: Secondary | ICD-10-CM | POA: Diagnosis not present

## 2021-12-25 ENCOUNTER — Ambulatory Visit (INDEPENDENT_AMBULATORY_CARE_PROVIDER_SITE_OTHER): Payer: Medicare Other

## 2021-12-25 DIAGNOSIS — J309 Allergic rhinitis, unspecified: Secondary | ICD-10-CM

## 2022-01-02 ENCOUNTER — Ambulatory Visit (INDEPENDENT_AMBULATORY_CARE_PROVIDER_SITE_OTHER): Payer: Medicare Other

## 2022-01-02 DIAGNOSIS — R21 Rash and other nonspecific skin eruption: Secondary | ICD-10-CM | POA: Diagnosis not present

## 2022-01-02 DIAGNOSIS — L03115 Cellulitis of right lower limb: Secondary | ICD-10-CM | POA: Diagnosis not present

## 2022-01-02 DIAGNOSIS — J309 Allergic rhinitis, unspecified: Secondary | ICD-10-CM | POA: Diagnosis not present

## 2022-01-03 DIAGNOSIS — J301 Allergic rhinitis due to pollen: Secondary | ICD-10-CM

## 2022-01-03 NOTE — Progress Notes (Signed)
VIALS EXP 01-04-23 

## 2022-01-04 DIAGNOSIS — J3089 Other allergic rhinitis: Secondary | ICD-10-CM | POA: Diagnosis not present

## 2022-01-15 ENCOUNTER — Ambulatory Visit (INDEPENDENT_AMBULATORY_CARE_PROVIDER_SITE_OTHER): Payer: Medicare Other

## 2022-01-15 DIAGNOSIS — J309 Allergic rhinitis, unspecified: Secondary | ICD-10-CM | POA: Diagnosis not present

## 2022-01-19 ENCOUNTER — Ambulatory Visit (INDEPENDENT_AMBULATORY_CARE_PROVIDER_SITE_OTHER): Payer: Medicare Other | Admitting: Internal Medicine

## 2022-01-19 ENCOUNTER — Encounter: Payer: Self-pay | Admitting: Internal Medicine

## 2022-01-19 VITALS — BP 126/84 | HR 117 | Temp 98.3°F | Resp 20 | Ht 67.0 in | Wt 167.1 lb

## 2022-01-19 DIAGNOSIS — H1013 Acute atopic conjunctivitis, bilateral: Secondary | ICD-10-CM | POA: Diagnosis not present

## 2022-01-19 DIAGNOSIS — J302 Other seasonal allergic rhinitis: Secondary | ICD-10-CM

## 2022-01-19 DIAGNOSIS — J4530 Mild persistent asthma, uncomplicated: Secondary | ICD-10-CM

## 2022-01-19 DIAGNOSIS — L2084 Intrinsic (allergic) eczema: Secondary | ICD-10-CM

## 2022-01-19 DIAGNOSIS — J4531 Mild persistent asthma with (acute) exacerbation: Secondary | ICD-10-CM

## 2022-01-19 DIAGNOSIS — T7800XA Anaphylactic reaction due to unspecified food, initial encounter: Secondary | ICD-10-CM

## 2022-01-19 DIAGNOSIS — H1010 Acute atopic conjunctivitis, unspecified eye: Secondary | ICD-10-CM

## 2022-01-19 DIAGNOSIS — J3089 Other allergic rhinitis: Secondary | ICD-10-CM

## 2022-01-19 MED ORDER — SPACER/AERO-HOLDING CHAMBERS DEVI: 0 refills | Status: AC

## 2022-01-19 MED ORDER — FLUTICASONE PROPIONATE HFA 44 MCG/ACT IN AERO: 2.0000 | INHALATION_SPRAY | Freq: Two times a day (BID) | RESPIRATORY_TRACT | 12 refills | Status: AC

## 2022-01-19 NOTE — Patient Instructions (Addendum)
Mild persistent asthma: not well controlled based on symptoms  May use albuterol rescue inhaler 2 puffs every 4 to 6 hours as needed for shortness of breath, chest tightness, coughing, and wheezing. May use albuterol rescue inhaler 2 puffs 5 to 15 minutes prior to strenuous physical activities. Monitor frequency of use.  Spirometry today showed: looked ok  PLAN: start Flovent 2 puffs twice daily With spacer exhale fully before putting mouth on spacer, then slow inhale.  Repeat for second dose    Seasonal and perennial allergic rhinoconjunctivitis Past history - 2018 bloodwork - IgE 6992.  Positive to dust mites, cat, dog, grass, mold, tree, weed, ragweed.  2022 bloodwork positive to dust mites, mold, tree pollen and ragweed pollen.  Borderline positive to weed pollen, grass pollen, cockroach, cat and dog.  Started AIT on 04/07/2021 (G-Rw-W-T & M-Dm-C-D)  Continue environmental control measures.  Continue Xyzal 5mg  once a day and may take twice a day during flares. Use Flonase (fluticasone) nasal spray 1 spray per nostril 1-2 times a day as needed for nasal congestion.  Nasal saline spray (i.e., Simply Saline) is recommended as needed especially after being outdoors for a prolonged time.  Continue allergy injections - given today.      Anaphylactic reaction due to food, subsequent encounter Past history - 2018 skin testing positive to almond, hazelnut, coconut. 2021 bloodwork positive to scallop, tree nuts, peanuts and eggs. Borderline positive to coconut, clam, shrimp and lobster. Component panel for the peanuts and tree nuts showed - more likely to have anaphylactic reactions to peanuts, hazelnuts, walnuts, cashew and 2022 nuts. Interim history - tolerates baked eggs. No reactions.  Continue strict avoidance of peanuts, tree nuts, eggs, shellfish, mollusks (clam, scallop), coconut for now.  Okay to eat baked egg items.  For mild symptoms you can take over the counter antihistamines such  as Benadryl and monitor symptoms closely. If symptoms worsen or if you have severe symptoms including breathing issues, throat closure, significant swelling, whole body hives, severe diarrhea and vomiting, lightheadedness then inject epinephrine and seek immediate medical care afterwards.   Other atopic dermatitis Past history - managed by derm.  Interim history - 2023 patch testing positive to gold, carba mix, epoxy resin, potassium dichromate, p-tert-butylphenol formaldehyde resin (done by derm). Follow up with dermatology as scheduled. Avoid items that were positive on patch testing Continue Dupixent injections every 2 weeks.  Continue proper skin care - use fragrance free and dye free shampoo such as vanicream.  Moisturize the eyelid the best as possible.  May use desonide 0.05% ointment twice a day as needed for mild eczema flares - okay to use on the face, neck, groin area. Do not use more than 1 week at a time. Use Eucrisa (crisaborole) 2% ointment twice a day on mild rash flares on the face and body. This is a non-steroid ointment  Follow up: 4 weeks   Thank you so much for letting me partake in your care today.  Don't hesitate to reach out if you have any additional concerns!  12-03-1995, MD  [facility] [place] [place]   Follow up:   Thank you so much for letting me partake in your care today.  Don't hesitate to reach out if you have any additional concerns!  Francisco Luz, MD  [facility] [place], [place]

## 2022-01-19 NOTE — Progress Notes (Signed)
Follow Up Note  RE: Francisco Cisneros MRN: [id number] DOB: Jul 18, 1986 Date of Office Visit: 01/19/2022  Referring provider: Farris Has, MD Primary care provider: Farris Has, MD  Chief Complaint: Cough (2 weeks)  History of Present Illness: I had the pleasure of seeing Francisco Cisneros for a follow up visit at the [facility] on 01/19/2022. He is a 36 y.o. male, who is being followed for allergic rhinoconjunctivitis on AIT, food allergy, atopic dermatitis, asthma. His previous allergy office visit was on 11/20/2021 with Dr. Selena Batten. Today is a regular follow up visit.  History obtained from patient  and  mother and father  .  Allergic Rhinitis - Medical therapy: Xyzal 5 mg, Flonase 1 spray per nostril twice a day as needed.  They stopped xyzal and started mucinex  - Symptoms: nasal congestion  - Adverse effects of medications: denies  - Allergy testing history: 2018 bloodwork - IgE 6992.  Positive to dust mites, cat, dog, grass, mold, tree, weed, ragweed. 2022 bloodwork positive to dust mites, mold, tree pollen and ragweed pollen.  Borderline positive to weed pollen, grass pollen, cockroach, cat and dog - Immunotherapy: Start him on 04/12/2021  -vial 1 (G-RW-W-T); Vial 2 (M-DM-C-D); last injection 01/15/2022 of 0.5 mL of red vial - Large Local Reactions: Mild - Systemic Reactions: denies  - Beta Blockers: denies  - History of Reflux: sometimes  - History of Sinus Surgery denies  ASTHMA - Medical therapy: Not on any controller meds - Rescue inhaler use: daily,  - Symptoms: increase cough over the past 2 weeks, associated [place]. He is having nocturnal coughing  - Exacerbation history: 0 ABX for respiratory illness since last visit, 0 OCS, 0ED, 0 UC visits in the past year  - ACT: 16 /25 - Adverse effects of medication: denies  - Previous FEV1: needs updating   Food Allergy: continues to avoid  peanuts, tree nuts, eggs, shellfish, mollusks  (clam, scallop), coconut. Eating limited baked egg items -0 accidental exposures - 0 use of epinephrine -Previous testing: 2018 skin testing positive to almond, hazelnut, coconut. 2021 bloodwork positive to scallop, tree nuts, peanuts and eggs. Borderline positive to coconut, clam, shrimp and lobster. Component panel for the peanuts and tree nuts showed - more likely to have anaphylactic reactions to peanuts, hazelnuts, walnuts, cashew and [place] nuts.  Atopic dermatitis: Managed by dermatology - reports eczema is improvement symptoms.  -current regimen: vanicream for emollient, desonide 0.05% Eucrisa 2% ointment for flares, Dupixent injections every 2 weeks -reports use of fragrance/dye free products - sleep un affected - itch  some itch - patch testing: 2023 patch testing positive to gold, carba mix, epoxy resin, potassium dichromate, p-tert-butylphenol formaldehyde resin (done by derm  Assessment and Plan: Francisco Cisneros is a 36 y.o. male with: Mild persistent asthma with acute exacerbation - Plan: Spirometry with Graph  Seasonal and perennial allergic rhinitis  Seasonal allergic conjunctivitis  Intrinsic atopic dermatitis  Allergy with anaphylaxis due to food Plan: Patient Instructions  Mild persistent asthma: not well controlled based on symptoms  May use albuterol rescue inhaler 2 puffs every 4 to 6 hours as needed for shortness of breath, chest tightness, coughing, and wheezing. May use albuterol rescue inhaler 2 puffs 5 to 15 minutes prior to strenuous physical activities. Monitor frequency of use.  Spirometry today showed: looked ok  PLAN: start Flovent 2 puffs twice daily With spacer exhale fully before putting mouth on spacer, then slow inhale.  Repeat  for second dose    Seasonal and perennial allergic rhinoconjunctivitis Past history - 2018 bloodwork - IgE 6992.  Positive to dust mites, cat, dog, grass, mold, tree, weed, ragweed.  2022 bloodwork positive to dust mites,  mold, tree pollen and ragweed pollen.  Borderline positive to weed pollen, grass pollen, cockroach, cat and dog.  Started AIT on 04/07/2021 (G-Rw-W-T & M-Dm-C-D)  Continue environmental control measures.  Continue Xyzal 5mg  once a day and may take twice a day during flares. Use Flonase (fluticasone) nasal spray 1 spray per nostril 1-2 times a day as needed for nasal congestion.  Nasal saline spray (i.e., Simply Saline) is recommended as needed especially after being outdoors for a prolonged time.  Continue allergy injections - given today.      Anaphylactic reaction due to food, subsequent encounter Past history - 2018 skin testing positive to almond, hazelnut, coconut. 2021 bloodwork positive to scallop, tree nuts, peanuts and eggs. Borderline positive to coconut, clam, shrimp and lobster. Component panel for the peanuts and tree nuts showed - more likely to have anaphylactic reactions to peanuts, hazelnuts, walnuts, cashew and 2022 nuts. Interim history - tolerates baked eggs. No reactions.  Continue strict avoidance of peanuts, tree nuts, eggs, shellfish, mollusks (clam, scallop), coconut for now.  Okay to eat baked egg items.  For mild symptoms you can take over the counter antihistamines such as Benadryl and monitor symptoms closely. If symptoms worsen or if you have severe symptoms including breathing issues, throat closure, significant swelling, whole body hives, severe diarrhea and vomiting, lightheadedness then inject epinephrine and seek immediate medical care afterwards.   Other atopic dermatitis Past history - managed by derm.  Interim history - 2023 patch testing positive to gold, carba mix, epoxy resin, potassium dichromate, p-tert-butylphenol formaldehyde resin (done by derm). Follow up with dermatology as scheduled. Avoid items that were positive on patch testing Continue Dupixent injections every 2 weeks.  Continue proper skin care - use fragrance free and dye free shampoo  such as vanicream.  Moisturize the eyelid the best as possible.  May use desonide 0.05% ointment twice a day as needed for mild eczema flares - okay to use on the face, neck, groin area. Do not use more than 1 week at a time. Use Eucrisa (crisaborole) 2% ointment twice a day on mild rash flares on the face and body. This is a non-steroid ointment  Follow up: 4 weeks   Thank you so much for letting me partake in your care today.  Don't hesitate to reach out if you have any additional concerns!  12-03-1995, MD  [facility] [place] [place]   Follow up:   Thank you so much for letting me partake in your care today.  Don't hesitate to reach out if you have any additional concerns!  Francisco Luz, MD  [facility] [place]  No follow-ups on file.  Meds ordered this encounter  Medications   fluticasone (FLOVENT HFA) 44 MCG/ACT inhaler    Sig: Inhale 2 puffs into the lungs 2 (two) times daily.    Dispense:  1 each    Refill:  12    Lab Orders  No laboratory test(s) ordered today   Diagnostics: Spirometry:  Tracings reviewed. His effort: It was hard to get consistent efforts and there is a question as to whether this reflects a maximal maneuver. FVC: 3.59 L FEV1: 3.13 L, 79% predicted FEV1/FVC ratio: 87% Interpretation: Spirometry consistent with normal pattern.  Please see scanned spirometry results for details.  Results interpreted by myself during this encounter and discussed with patient/family.   Medication List:  Current Outpatient Medications  Medication Sig Dispense Refill   acetaminophen (TYLENOL) 500 MG tablet Take 1,000 mg by mouth every 6 (six) hours as needed for moderate pain.     albuterol (PROAIR HFA) 108 (90 Base) MCG/ACT inhaler Inhale 2 puffs into the lungs every 4 (four) hours as needed for wheezing or shortness of breath (coughing fits). 18 g 1   clindamycin (CLINDAGEL) 1 % gel Apply topically 2 (two) times  daily.     desonide (DESOWEN) 0.05 % ointment Apply sparingly to affected areas twice daily as needed to the face and/or neck. 15 g 5   dexmethylphenidate (FOCALIN XR) 20 MG 24 hr capsule Take 20 mg by mouth every morning.     dexmethylphenidate (FOCALIN) 10 MG tablet Take 10 mg by mouth daily.     docusate sodium (COLACE) 100 MG capsule Take 100 mg by mouth daily as needed for mild constipation.     DUPIXENT 300 MG/2ML SOPN Inject into the skin.     EPINEPHrine 0.3 mg/0.3 mL IJ SOAJ injection Inject 0.3 mg into the muscle daily as needed (allergic reaction). 1 each 2   EUCRISA 2 % OINT Apply topically 2 (two) times daily.     fluticasone (CUTIVATE) 0.05 % cream Apply topically.     fluticasone (FLONASE) 50 MCG/ACT nasal spray Place 1-2 sprays into both nostrils daily as needed for rhinitis. 16 g 5   fluticasone (FLOVENT HFA) 44 MCG/ACT inhaler Inhale 2 puffs into the lungs 2 (two) times daily. 1 each 12   hydrocortisone cream 0.5 % Apply topically.     ketoconazole (NIZORAL) 2 % cream Apply topically daily as needed (athlete's foot). 60 g 1   Lactobacillus-Inulin (CULTURELLE DIGESTIVE HEALTH PO) Take 1 capsule by mouth daily.     levocetirizine (XYZAL) 5 MG tablet Take 5 mg by mouth every evening.     LORazepam (ATIVAN) 0.5 MG tablet Take 0.5 mg by mouth daily as needed for anxiety.     mupirocin ointment (BACTROBAN) 2 % SMARTSIG:1 Application Topical 2-3 Times Daily     Omega-3 Fatty Acids (FISH OIL) 645 MG CAPS Take 640 mg by mouth 3 (three) times daily.      Polyethyl Glycol-Propyl Glycol (SYSTANE ULTRA OP) Apply 1 drop to eye daily as needed.     sertraline (ZOLOFT) 50 MG tablet Take 50 mg by mouth daily.     Soap & Cleansers (VANICREAM CLEANSER EX)      Spacer/Aero-Holding Chambers (AEROCHAMBER PLUS FLO-VU LARGE) MISC      terbinafine (LAMISIL) 1 % cream Apply 1 application  topically 2 (two) times daily.     triamcinolone ointment (KENALOG) 0.1 % Apply sparingly to affected areas twice  daily as needed, below the face. 30 g 5   acyclovir (ZOVIRAX) 200 MG capsule Take by mouth. (Patient not taking: Reported on 10/11/2021)     albuterol (PROVENTIL) (2.5 MG/3ML) 0.083% nebulizer solution Take 2.5 mg by nebulization every 6 (six) hours as needed for wheezing or shortness of breath.  (Patient not taking: Reported on 10/11/2021)     Ciclopirox 1 % shampoo Apply 1 Bottle topically at bedtime. (Patient not taking: Reported on 10/11/2021)     No current facility-administered medications for this visit.   Allergies: Allergies  Allergen Reactions   Eggs Or Egg-Derived Products Anaphylaxis   Other Anaphylaxis  Allergic to ALL NUTS   Peanut Oil Anaphylaxis   Shellfish Allergy Anaphylaxis   Coconut (Cocos Nucifera) Rash   Haloperidol Other (See Comments)    Drowsy for days   Peanut-Containing Drug Products    I reviewed his past medical history, social history, family history, and environmental history and no significant changes have been reported from his previous visit.  ROS: All others negative except as noted per HPI.   Objective: BP 126/84   Pulse (!) 117   Temp 98.3 F (36.8 C)   Resp 20   Ht 5\' 7"  (1.702 m)   Wt 167 lb 2 oz (75.8 kg)   BMI 26.18 kg/m  Body mass index is 26.18 kg/m. General Appearance:  Alert, cooperative, no distress, appears stated age  Head:  Normocephalic, without obvious abnormality, atraumatic  Eyes:  Conjunctiva clear, EOM's intact  Nose: Nares normal,  erythematous nasal mucosa, normal mucosa, no visible anterior polyps, and septum midline  Throat: Lips, tongue normal; teeth and gums normal, normal posterior oropharynx and no tonsillar exudate  Neck: Supple, symmetrical  Lungs:   Prolonged expiratory  and clear to auscultation bilaterally, Respirations unlabored, intermittent dry coughing  Heart:  regular rate and rhythm and no murmur, Appears well perfused  Extremities: No edema  Skin: Skin color, texture, turgor normal, no rashes or  lesions on visualized portions of skin  Neurologic: No gross deficits   Previous notes and tests were reviewed. The plan was reviewed with the patient/family, and all questions/concerned were addressed.  It was my pleasure to see Francisco Cisneros today and participate in his care. Please feel free to contact me with any questions or concerns.  Sincerely,  Francisco Gens, MD  Allergy & Immunology  [facility] Office: [phone]

## 2022-01-22 ENCOUNTER — Ambulatory Visit (INDEPENDENT_AMBULATORY_CARE_PROVIDER_SITE_OTHER): Payer: Medicare Other

## 2022-01-22 DIAGNOSIS — J309 Allergic rhinitis, unspecified: Secondary | ICD-10-CM | POA: Diagnosis not present

## 2022-01-29 ENCOUNTER — Ambulatory Visit (INDEPENDENT_AMBULATORY_CARE_PROVIDER_SITE_OTHER): Payer: Medicare Other

## 2022-01-29 DIAGNOSIS — J309 Allergic rhinitis, unspecified: Secondary | ICD-10-CM | POA: Diagnosis not present

## 2022-01-30 ENCOUNTER — Telehealth: Payer: Self-pay | Admitting: Internal Medicine

## 2022-01-30 NOTE — Telephone Encounter (Signed)
Mom called in and states that Francisco Cisneros still has his cough and he lives in a group home and they went on a trip where they are going swimming.  Mom is concerned because she doesn't know if they are going swimming in a pool or lake.  Mom is concerned bc she states the pool has chlorine and feels that may trigger his asthma but feels if it is a lake, the dirt and bacteria in a lake may trigger his asthma.  Mom is concerned with his coughing and wants to know if he should be swimming today with his cough, while he is taking the Flovent?  Please advise.

## 2022-01-31 NOTE — Telephone Encounter (Signed)
Spoken to patient's mom and notified Dr Lomasney's comments. Verbalized understanding.   

## 2022-02-08 ENCOUNTER — Ambulatory Visit (INDEPENDENT_AMBULATORY_CARE_PROVIDER_SITE_OTHER): Payer: Medicare Other

## 2022-02-08 DIAGNOSIS — J309 Allergic rhinitis, unspecified: Secondary | ICD-10-CM | POA: Diagnosis not present

## 2022-02-09 ENCOUNTER — Ambulatory Visit (INDEPENDENT_AMBULATORY_CARE_PROVIDER_SITE_OTHER): Payer: Medicare Other | Admitting: Internal Medicine

## 2022-02-09 VITALS — BP 132/88 | HR 97 | Temp 97.0°F | Ht 67.0 in | Wt 167.0 lb

## 2022-02-09 DIAGNOSIS — F411 Generalized anxiety disorder: Secondary | ICD-10-CM | POA: Insufficient documentation

## 2022-02-09 DIAGNOSIS — J4530 Mild persistent asthma, uncomplicated: Secondary | ICD-10-CM | POA: Diagnosis not present

## 2022-02-09 DIAGNOSIS — Z91010 Allergy to peanuts: Secondary | ICD-10-CM | POA: Insufficient documentation

## 2022-02-09 DIAGNOSIS — M41119 Juvenile idiopathic scoliosis, site unspecified: Secondary | ICD-10-CM | POA: Insufficient documentation

## 2022-02-09 DIAGNOSIS — I4510 Unspecified right bundle-branch block: Secondary | ICD-10-CM | POA: Insufficient documentation

## 2022-02-09 DIAGNOSIS — L2084 Intrinsic (allergic) eczema: Secondary | ICD-10-CM | POA: Diagnosis not present

## 2022-02-09 DIAGNOSIS — H1013 Acute atopic conjunctivitis, bilateral: Secondary | ICD-10-CM

## 2022-02-09 DIAGNOSIS — D582 Other hemoglobinopathies: Secondary | ICD-10-CM | POA: Insufficient documentation

## 2022-02-09 DIAGNOSIS — E781 Pure hyperglyceridemia: Secondary | ICD-10-CM | POA: Insufficient documentation

## 2022-02-09 DIAGNOSIS — F902 Attention-deficit hyperactivity disorder, combined type: Secondary | ICD-10-CM | POA: Insufficient documentation

## 2022-02-09 DIAGNOSIS — J3089 Other allergic rhinitis: Secondary | ICD-10-CM | POA: Diagnosis not present

## 2022-02-09 DIAGNOSIS — I8500 Esophageal varices without bleeding: Secondary | ICD-10-CM | POA: Insufficient documentation

## 2022-02-09 DIAGNOSIS — J309 Allergic rhinitis, unspecified: Secondary | ICD-10-CM | POA: Insufficient documentation

## 2022-02-09 DIAGNOSIS — J4520 Mild intermittent asthma, uncomplicated: Secondary | ICD-10-CM | POA: Insufficient documentation

## 2022-02-09 DIAGNOSIS — H1045 Other chronic allergic conjunctivitis: Secondary | ICD-10-CM

## 2022-02-09 DIAGNOSIS — K5900 Constipation, unspecified: Secondary | ICD-10-CM | POA: Insufficient documentation

## 2022-02-09 DIAGNOSIS — T7800XA Anaphylactic reaction due to unspecified food, initial encounter: Secondary | ICD-10-CM

## 2022-02-09 NOTE — Patient Instructions (Addendum)
Mild persistent asthma:  May use albuterol rescue inhaler 2 puffs every 4 to 6 hours as needed for shortness of breath, chest tightness, coughing, and wheezing. May use albuterol rescue inhaler 2 puffs 5 to 15 minutes prior to strenuous physical activities. Monitor frequency of use.  Flovent 2 puffs twice daily for 7 days at start of Upper Respiratory Infections Can restart if using albuterol more than 3 times a week consistently for cough, wheeze, chest tightness or shortness of breath  With spacer exhale fully before putting mouth on spacer, then slow inhale.  Repeat for second dose    Seasonal and perennial allergic rhinoconjunctivitis Past history - 2018 bloodwork - IgE 6992.  Positive to dust mites, cat, dog, grass, mold, tree, weed, ragweed.  2022 bloodwork positive to dust mites, mold, tree pollen and ragweed pollen.  Borderline positive to weed pollen, grass pollen, cockroach, cat and dog.  Started AIT on 04/07/2021 (G-Rw-W-T & M-Dm-C-D)  Continue environmental control measures.  Continue Xyzal 5mg  once a day and may take twice a day during flares. Use Flonase (fluticasone) nasal spray 1 spray per nostril 1-2 times a day as needed for nasal congestion.  Nasal saline spray (i.e., Simply Saline) is recommended as needed especially after being outdoors for a prolonged time.  Continue allergy injections     Anaphylactic reaction due to food, subsequent encounter Past history - 2018 skin testing positive to almond, hazelnut, coconut. 2021 bloodwork positive to scallop, tree nuts, peanuts and eggs. Borderline positive to coconut, clam, shrimp and lobster. Component panel for the peanuts and tree nuts showed - more likely to have anaphylactic reactions to peanuts, hazelnuts, walnuts, cashew and 2022 nuts. Interim history - tolerates baked eggs. No reactions.  Continue strict avoidance of peanuts, tree nuts, eggs, shellfish, mollusks (clam, scallop), coconut for now.  Okay to eat baked  egg items.  For mild symptoms you can take over the counter antihistamines such as Benadryl and monitor symptoms closely. If symptoms worsen or if you have severe symptoms including breathing issues, throat closure, significant swelling, whole body hives, severe diarrhea and vomiting, lightheadedness then inject epinephrine and seek immediate medical care afterwards.   Other atopic dermatitis Past history - managed by derm.  Interim history - 2023 patch testing positive to gold, carba mix, epoxy resin, potassium dichromate, p-tert-butylphenol formaldehyde resin (done by derm). Follow up with dermatology as scheduled. Avoid items that were positive on patch testing Continue Dupixent injections every 2 weeks.  Continue proper skin care - use fragrance free and dye free shampoo such as vanicream.  Moisturize the eyelid the best as possible.  May use desonide 0.05% ointment twice a day as needed for mild eczema flares - okay to use on the face, neck, groin area. Do not use more than 1 week at a time. Use Eucrisa (crisaborole) 2% ointment twice a day on mild rash flares on the face and body. This is a non-steroid ointment  Follow up: 3 months   Thank you so much for letting me partake in your care today.  Don't hesitate to reach out if you have any additional concerns!  12-03-1995, MD  [facility] [place] [place]   Follow up:   Thank you so much for letting me partake in your care today.  Don't hesitate to reach out if you have any additional concerns!  Ferol Luz, MD  [facility] [place], [place]

## 2022-02-09 NOTE — Progress Notes (Signed)
Follow Up Note  RE: Francisco Cisneros MRN: [id number] DOB: 10/15/1985 Date of Office Visit: 02/09/2022  Referring provider: Farris Has, MD Primary care provider: Farris Has, MD  Chief Complaint: Follow-up (Wants to wean off Flovent; does still have cough; does not use albuterol d/t hyperactivity)  History of Present Illness: I had the pleasure of seeing Francisco Cisneros for a [facility]  on 02/09/2022. He is a 36 y.o. male, who is being followed for mild persistent asthma, atopic dermatitis, allergic rhinitis on AIT. His previous allergy office visit was on 01/19/2022 with Dr. Marlynn Perking. Today is a regular follow up visit.  History obtained from patient  and father.  At last visit he was started on Flovent 44 mcg 2 puffs twice daily due to increased cough and dyspnea.  Spirometry at last visit showed FEV1 of 3.13L, 79% predicted.  Today they report significant improvement in cough with Flovent.  He has had increased rhinorrhea and the only using Xyzal and not Flonase.  They are going on vacation and are interested in tapering down Flovent soon as possible due to his prior history with thrush.  He has reported increased dyspnea which he attributes to being outside with the heat.  He has not required albuterol however.  Atopic dermatitis is under good control and he has had no adverse reactions recently with Dupixent.  He is tolerating his AIT injections well and feel like this is helped his eczema control as well.  He continues to strictly avoid all nuts, straight egg, shellfish, mollusks.  He has an EpiPen and denies any accidental ingestions or use of EpiPen since last visit    Assessment and Plan: Francisco Cisneros is a 36 y.o. male with: Mild persistent asthma without complication - Plan: Spirometry with Graph  Other allergic rhinitis  Other chronic allergic conjunctivitis of both eyes  Intrinsic atopic dermatitis  Allergy with anaphylaxis due  to food Plan: Patient Instructions  Mild persistent asthma:  May use albuterol rescue inhaler 2 puffs every 4 to 6 hours as needed for shortness of breath, chest tightness, coughing, and wheezing. May use albuterol rescue inhaler 2 puffs 5 to 15 minutes prior to strenuous physical activities. Monitor frequency of use.  Flovent 2 puffs twice daily for 7 days at start of Upper Respiratory Infections Can restart if using albuterol more than 3 times a week consistently for cough, wheeze, chest tightness or shortness of breath  With spacer exhale fully before putting mouth on spacer, then slow inhale.  Repeat for second dose    Seasonal and perennial allergic rhinoconjunctivitis Past history - 2018 bloodwork - IgE 6992.  Positive to dust mites, cat, dog, grass, mold, tree, weed, ragweed.  2022 bloodwork positive to dust mites, mold, tree pollen and ragweed pollen.  Borderline positive to weed pollen, grass pollen, cockroach, cat and dog.  Started AIT on 04/07/2021 (G-Rw-W-T & M-Dm-C-D)  Continue environmental control measures.  Continue Xyzal 5mg  once a day and may take twice a day during flares. Use Flonase (fluticasone) nasal spray 1 spray per nostril 1-2 times a day as needed for nasal congestion.  Nasal saline spray (i.e., Simply Saline) is recommended as needed especially after being outdoors for a prolonged time.  Continue allergy injections     Anaphylactic reaction due to food, subsequent encounter Past history - 2018 skin testing positive to almond, hazelnut, coconut. 2021 bloodwork positive to scallop, tree nuts, peanuts and eggs. Borderline positive to coconut, clam,  shrimp and lobster. Component panel for the peanuts and tree nuts showed - more likely to have anaphylactic reactions to peanuts, hazelnuts, walnuts, cashew and [place] nuts. Interim history - tolerates baked eggs. No reactions.  Continue strict avoidance of peanuts, tree nuts, eggs, shellfish, mollusks (clam, scallop),  coconut for now.  Okay to eat baked egg items.  For mild symptoms you can take over the counter antihistamines such as Benadryl and monitor symptoms closely. If symptoms worsen or if you have severe symptoms including breathing issues, throat closure, significant swelling, whole body hives, severe diarrhea and vomiting, lightheadedness then inject epinephrine and seek immediate medical care afterwards.   Other atopic dermatitis Past history - managed by derm.  Interim history - 2023 patch testing positive to gold, carba mix, epoxy resin, potassium dichromate, p-tert-butylphenol formaldehyde resin (done by derm). Follow up with dermatology as scheduled. Avoid items that were positive on patch testing Continue Dupixent injections every 2 weeks.  Continue proper skin care - use fragrance free and dye free shampoo such as vanicream.  Moisturize the eyelid the best as possible.  May use desonide 0.05% ointment twice a day as needed for mild eczema flares - okay to use on the face, neck, groin area. Do not use more than 1 week at a time. Use Eucrisa (crisaborole) 2% ointment twice a day on mild rash flares on the face and body. This is a non-steroid ointment  Follow up: 3 months   Thank you so much for letting me partake in your care today.  Don't hesitate to reach out if you have any additional concerns!  Ferol Luz, MD  [facility] [place] [place]   Follow up:   Thank you so much for letting me partake in your care today.  Don't hesitate to reach out if you have any additional concerns!  Ferol Luz, MD  [facility] [place]  No orders of the defined types were placed in this encounter.   Lab Orders  No laboratory test(s) ordered today   Diagnostics: Spirometry:  Tracings reviewed. His effort: It was hard to get consistent efforts and there is a question as to whether this reflects a maximal maneuver. FVC: 3.89  L FEV1: 3.24 L, 81% predicted FEV1/FVC ratio: 83% Interpretation: No overt abnormalities noted given today's efforts.  Please see scanned spirometry results for details.   Results interpreted by myself during this encounter and discussed with patient/family.   Medication List:  Current Outpatient Medications  Medication Sig Dispense Refill   acetaminophen (TYLENOL) 500 MG tablet Take 1,000 mg by mouth every 6 (six) hours as needed for moderate pain.     acyclovir (ZOVIRAX) 200 MG capsule Take by mouth.     albuterol (PROAIR HFA) 108 (90 Base) MCG/ACT inhaler Inhale 2 puffs into the lungs every 4 (four) hours as needed for wheezing or shortness of breath (coughing fits). 18 g 1   albuterol (PROVENTIL) (2.5 MG/3ML) 0.083% nebulizer solution Take 2.5 mg by nebulization every 6 (six) hours as needed for wheezing or shortness of breath.     Ciclopirox 1 % shampoo Apply 1 Bottle topically at bedtime.     clindamycin (CLINDAGEL) 1 % gel Apply topically 2 (two) times daily.     desonide (DESOWEN) 0.05 % ointment Apply sparingly to affected areas twice daily as needed to the face and/or neck. 15 g 5   dexmethylphenidate (FOCALIN XR) 20 MG 24 hr capsule Take 20 mg by  mouth every morning.     dexmethylphenidate (FOCALIN) 10 MG tablet Take 10 mg by mouth daily.     docusate sodium (COLACE) 100 MG capsule Take 100 mg by mouth daily as needed for mild constipation.     DUPIXENT 300 MG/2ML SOPN Inject into the skin.     EPINEPHrine 0.3 mg/0.3 mL IJ SOAJ injection Inject 0.3 mg into the muscle daily as needed (allergic reaction). 1 each 2   EUCRISA 2 % OINT Apply topically 2 (two) times daily.     fluticasone (CUTIVATE) 0.05 % cream Apply topically.     fluticasone (FLONASE) 50 MCG/ACT nasal spray Place 1-2 sprays into both nostrils daily as needed for rhinitis. 16 g 5   fluticasone (FLOVENT HFA) 44 MCG/ACT inhaler Inhale 2 puffs into the lungs 2 (two) times daily. 1 each 12   hydrocortisone cream 0.5 %  Apply topically.     ketoconazole (NIZORAL) 2 % cream Apply topically daily as needed (athlete's foot). 60 g 1   Lactobacillus-Inulin (CULTURELLE DIGESTIVE HEALTH PO) Take 1 capsule by mouth daily.     levocetirizine (XYZAL) 5 MG tablet Take 5 mg by mouth every evening.     LORazepam (ATIVAN) 0.5 MG tablet Take 0.5 mg by mouth daily as needed for anxiety.     mupirocin ointment (BACTROBAN) 2 % SMARTSIG:1 Application Topical 2-3 Times Daily     Omega-3 Fatty Acids (FISH OIL) 645 MG CAPS Take 640 mg by mouth 3 (three) times daily.      Polyethyl Glycol-Propyl Glycol (SYSTANE ULTRA OP) Apply 1 drop to eye daily as needed.     sertraline (ZOLOFT) 50 MG tablet Take 50 mg by mouth daily.     Soap & Cleansers (VANICREAM CLEANSER EX)      Spacer/Aero-Holding Chambers (AEROCHAMBER PLUS FLO-VU LARGE) MISC      Spacer/Aero-Holding Rudean Curt Use with MDI inhaler 1 each 0   terbinafine (LAMISIL) 1 % cream Apply 1 application  topically 2 (two) times daily.     triamcinolone ointment (KENALOG) 0.1 % Apply sparingly to affected areas twice daily as needed, below the face. 30 g 5   No current facility-administered medications for this visit.   Allergies: Allergies  Allergen Reactions   Eggs Or Egg-Derived Products Anaphylaxis   Other Anaphylaxis    Allergic to ALL NUTS   Peanut Oil Anaphylaxis   Shellfish Allergy Anaphylaxis   Coconut (Cocos Nucifera) Rash   Haloperidol Other (See Comments)    Drowsy for days   Peanut-Containing Drug Products    I reviewed his past medical history, social history, family history, and environmental history and no significant changes have been reported from his previous visit.  ROS: All others negative except as noted per HPI.   Objective: BP 132/88   Pulse 97   Temp (!) 97 F (36.1 C) (Temporal)   Ht 5\' 7"  (1.702 m)   Wt 167 lb (75.8 kg)   SpO2 97%   BMI 26.16 kg/m  Body mass index is 26.16 kg/m. General Appearance:  Alert, cooperative, no distress,  appears stated age  Head:  Normocephalic, without obvious abnormality, atraumatic  Eyes:  Conjunctiva clear, EOM's intact  Nose: Nares normal,  erythematous nasal mucosa with clear rhinorrhea, hypertrophic turbinates, no visible anterior polyps, and septum midline  Throat: Lips, tongue normal; teeth and gums normal, normal posterior oropharynx  Neck: Supple, symmetrical  Lungs:   clear to auscultation bilaterally, Respirations unlabored, no coughing  Heart:  regular rate and rhythm  and no murmur, Appears well perfused  Extremities: No edema  Skin: Skin color, texture, turgor normal, no rashes or lesions on visualized portions of skin  Neurologic: No gross deficits   Previous notes and tests were reviewed. The plan was reviewed with the patient/family, and all questions/concerned were addressed.  It was my pleasure to see Francisco Cisneros today and participate in his care. Please feel free to contact me with any questions or concerns.  Sincerely,  Ferol Luz, MD  Allergy & Immunology  [facility] [place] Office: [phone]

## 2022-02-12 ENCOUNTER — Ambulatory Visit (INDEPENDENT_AMBULATORY_CARE_PROVIDER_SITE_OTHER): Payer: Medicare Other | Admitting: *Deleted

## 2022-02-12 DIAGNOSIS — J309 Allergic rhinitis, unspecified: Secondary | ICD-10-CM

## 2022-02-13 DIAGNOSIS — E785 Hyperlipidemia, unspecified: Secondary | ICD-10-CM | POA: Diagnosis not present

## 2022-02-13 DIAGNOSIS — L309 Dermatitis, unspecified: Secondary | ICD-10-CM | POA: Diagnosis not present

## 2022-02-13 DIAGNOSIS — J4520 Mild intermittent asthma, uncomplicated: Secondary | ICD-10-CM | POA: Diagnosis not present

## 2022-02-13 DIAGNOSIS — D582 Other hemoglobinopathies: Secondary | ICD-10-CM | POA: Diagnosis not present

## 2022-02-13 DIAGNOSIS — L2084 Intrinsic (allergic) eczema: Secondary | ICD-10-CM | POA: Diagnosis not present

## 2022-02-19 ENCOUNTER — Ambulatory Visit (INDEPENDENT_AMBULATORY_CARE_PROVIDER_SITE_OTHER): Payer: Medicare Other

## 2022-02-19 DIAGNOSIS — J309 Allergic rhinitis, unspecified: Secondary | ICD-10-CM

## 2022-02-26 ENCOUNTER — Ambulatory Visit (INDEPENDENT_AMBULATORY_CARE_PROVIDER_SITE_OTHER): Payer: Medicare Other

## 2022-02-26 DIAGNOSIS — J309 Allergic rhinitis, unspecified: Secondary | ICD-10-CM | POA: Diagnosis not present

## 2022-02-28 DIAGNOSIS — F419 Anxiety disorder, unspecified: Secondary | ICD-10-CM | POA: Diagnosis not present

## 2022-03-05 ENCOUNTER — Ambulatory Visit (INDEPENDENT_AMBULATORY_CARE_PROVIDER_SITE_OTHER): Payer: Medicare Other

## 2022-03-05 DIAGNOSIS — J309 Allergic rhinitis, unspecified: Secondary | ICD-10-CM | POA: Diagnosis not present

## 2022-03-12 ENCOUNTER — Ambulatory Visit (INDEPENDENT_AMBULATORY_CARE_PROVIDER_SITE_OTHER): Payer: Medicare Other

## 2022-03-12 DIAGNOSIS — J309 Allergic rhinitis, unspecified: Secondary | ICD-10-CM

## 2022-03-19 ENCOUNTER — Ambulatory Visit (INDEPENDENT_AMBULATORY_CARE_PROVIDER_SITE_OTHER): Payer: Medicare Other | Admitting: *Deleted

## 2022-03-19 DIAGNOSIS — J309 Allergic rhinitis, unspecified: Secondary | ICD-10-CM

## 2022-03-21 DIAGNOSIS — J301 Allergic rhinitis due to pollen: Secondary | ICD-10-CM | POA: Diagnosis not present

## 2022-03-21 NOTE — Progress Notes (Signed)
VIALS EXP 03-22-23 

## 2022-03-22 DIAGNOSIS — J3089 Other allergic rhinitis: Secondary | ICD-10-CM | POA: Diagnosis not present

## 2022-03-26 ENCOUNTER — Ambulatory Visit (INDEPENDENT_AMBULATORY_CARE_PROVIDER_SITE_OTHER): Payer: Medicare Other | Admitting: *Deleted

## 2022-03-26 DIAGNOSIS — J309 Allergic rhinitis, unspecified: Secondary | ICD-10-CM

## 2022-03-28 DIAGNOSIS — R21 Rash and other nonspecific skin eruption: Secondary | ICD-10-CM | POA: Diagnosis not present

## 2022-03-28 DIAGNOSIS — H60311 Diffuse otitis externa, right ear: Secondary | ICD-10-CM | POA: Diagnosis not present

## 2022-03-28 DIAGNOSIS — W57XXXA Bitten or stung by nonvenomous insect and other nonvenomous arthropods, initial encounter: Secondary | ICD-10-CM | POA: Diagnosis not present

## 2022-03-30 DIAGNOSIS — H10413 Chronic giant papillary conjunctivitis, bilateral: Secondary | ICD-10-CM | POA: Diagnosis not present

## 2022-03-30 DIAGNOSIS — H5213 Myopia, bilateral: Secondary | ICD-10-CM | POA: Diagnosis not present

## 2022-04-02 DIAGNOSIS — H60391 Other infective otitis externa, right ear: Secondary | ICD-10-CM | POA: Diagnosis not present

## 2022-04-02 DIAGNOSIS — W57XXXA Bitten or stung by nonvenomous insect and other nonvenomous arthropods, initial encounter: Secondary | ICD-10-CM | POA: Diagnosis not present

## 2022-04-02 DIAGNOSIS — B356 Tinea cruris: Secondary | ICD-10-CM | POA: Diagnosis not present

## 2022-04-02 DIAGNOSIS — S60460A Insect bite (nonvenomous) of right index finger, initial encounter: Secondary | ICD-10-CM | POA: Diagnosis not present

## 2022-04-06 ENCOUNTER — Ambulatory Visit (INDEPENDENT_AMBULATORY_CARE_PROVIDER_SITE_OTHER): Payer: Medicare Other | Admitting: *Deleted

## 2022-04-06 DIAGNOSIS — J309 Allergic rhinitis, unspecified: Secondary | ICD-10-CM | POA: Diagnosis not present

## 2022-04-16 ENCOUNTER — Ambulatory Visit (INDEPENDENT_AMBULATORY_CARE_PROVIDER_SITE_OTHER): Payer: Medicare Other

## 2022-04-16 DIAGNOSIS — J309 Allergic rhinitis, unspecified: Secondary | ICD-10-CM | POA: Diagnosis not present

## 2022-04-24 ENCOUNTER — Ambulatory Visit (INDEPENDENT_AMBULATORY_CARE_PROVIDER_SITE_OTHER): Payer: Medicare Other

## 2022-04-24 DIAGNOSIS — J309 Allergic rhinitis, unspecified: Secondary | ICD-10-CM

## 2022-05-01 ENCOUNTER — Ambulatory Visit (INDEPENDENT_AMBULATORY_CARE_PROVIDER_SITE_OTHER): Payer: Medicare Other | Admitting: *Deleted

## 2022-05-01 DIAGNOSIS — J309 Allergic rhinitis, unspecified: Secondary | ICD-10-CM | POA: Diagnosis not present

## 2022-05-07 ENCOUNTER — Ambulatory Visit (INDEPENDENT_AMBULATORY_CARE_PROVIDER_SITE_OTHER): Payer: Medicare Other

## 2022-05-07 DIAGNOSIS — J309 Allergic rhinitis, unspecified: Secondary | ICD-10-CM | POA: Diagnosis not present

## 2022-05-14 ENCOUNTER — Ambulatory Visit (INDEPENDENT_AMBULATORY_CARE_PROVIDER_SITE_OTHER): Payer: Medicare Other

## 2022-05-14 DIAGNOSIS — J309 Allergic rhinitis, unspecified: Secondary | ICD-10-CM

## 2022-05-21 ENCOUNTER — Ambulatory Visit (INDEPENDENT_AMBULATORY_CARE_PROVIDER_SITE_OTHER): Payer: Medicare Other | Admitting: *Deleted

## 2022-05-21 DIAGNOSIS — J309 Allergic rhinitis, unspecified: Secondary | ICD-10-CM | POA: Diagnosis not present

## 2022-05-27 NOTE — Progress Notes (Unsigned)
Follow Up Note  RE: Francisco Cisneros MRN: [id number] DOB: [birth year] Date of Office Visit: 05/28/2022  Referring provider: [name], MD Primary care provider: [name], MD  Chief Complaint: No chief complaint on file.  History of Present Illness: I had the pleasure of seeing Francisco Cisneros for a follow up visit at the [facility] [place] [facility] [place] on 05/27/2022. He is a 36 y.o. male, who is being followed for asthma, allergic rhinoconjunctivitis on AIT, food allergy, atopic dermatitis on Dupixent. His previous allergy office visit was on 02/09/2022 with Dr. Edison Pace. Today is a regular follow up visit.  Mild persistent asthma:  May use albuterol rescue inhaler 2 puffs every 4 to 6 hours as needed for shortness of breath, chest tightness, coughing, and wheezing. May use albuterol rescue inhaler 2 puffs 5 to 15 minutes prior to strenuous physical activities. Monitor frequency of use.  Flovent 22mcg 2 puffs twice daily for 7 days at start of Upper Respiratory Infections Can restart if using albuterol more than 3 times a week consistently for cough, wheeze, chest tightness or shortness of breath  With spacer exhale fully before putting mouth on spacer, then slow inhale.  Repeat for second dose      Seasonal and perennial allergic rhinoconjunctivitis Past history - 2018 bloodwork - IgE 6992.  Positive to dust mites, cat, dog, grass, mold, tree, weed, ragweed.  2022 bloodwork positive to dust mites, mold, tree pollen and ragweed pollen.  Borderline positive to weed pollen, grass pollen, cockroach, cat and dog.  Started AIT on 04/07/2021 (G-Rw-W-T & M-Dm-C-D)   Continue environmental control measures.  Continue Xyzal 5mg  once a day and may take twice a day during flares. Use Flonase (fluticasone) nasal spray 1 spray per nostril 1-2 times a day as needed for nasal congestion.  Nasal saline spray (i.e., Simply Saline) is recommended as needed especially after being outdoors for a  prolonged time.  Continue allergy injections     Anaphylactic reaction due to food, subsequent encounter Past history - 2018 skin testing positive to almond, hazelnut, coconut. 2021 bloodwork positive to scallop, tree nuts, peanuts and eggs. Borderline positive to coconut, clam, shrimp and lobster. Component panel for the peanuts and tree nuts showed - more likely to have anaphylactic reactions to peanuts, hazelnuts, walnuts, cashew and [place] nuts. Interim history - tolerates baked eggs. No reactions.  Continue strict avoidance of peanuts, tree nuts, eggs, shellfish, mollusks (clam, scallop), coconut for now.  Okay to eat baked egg items.  For mild symptoms you can take over the counter antihistamines such as Benadryl and monitor symptoms closely. If symptoms worsen or if you have severe symptoms including breathing issues, throat closure, significant swelling, whole body hives, severe diarrhea and vomiting, lightheadedness then inject epinephrine and seek immediate medical care afterwards.   Other atopic dermatitis Past history - managed by derm.  Interim history - 2023 patch testing positive to gold, carba mix, epoxy resin, potassium dichromate, p-tert-butylphenol formaldehyde resin (done by derm). Follow up with dermatology as scheduled. Avoid items that were positive on patch testing Continue Dupixent injections every 2 weeks.  Continue proper skin care - use fragrance free and dye free shampoo such as vanicream.  Moisturize the eyelid the best as possible.  May use desonide 0.05% ointment twice a day as needed for mild eczema flares - okay to use on the face, neck, groin area. Do not use more than 1 week at a time. Use Eucrisa (crisaborole) 2% ointment twice a day  on mild rash flares on the face and body. This is a non-steroid ointment   Follow up: 3 months   Assessment and Plan: Francisco Cisneros is a 36 y.o. male with: No problem-specific Assessment & Plan notes found for this  encounter.  No follow-ups on file.  No orders of the defined types were placed in this encounter.  Lab Orders  No laboratory test(s) ordered today    Diagnostics: Spirometry:  Tracings reviewed. His effort: {Blank single:19197::"Good reproducible efforts.","It was hard to get consistent efforts and there is a question as to whether this reflects a maximal maneuver.","Poor effort, data can not be interpreted."} FVC: ***L FEV1: ***L, ***% predicted FEV1/FVC ratio: ***% Interpretation: {Blank single:19197::"Spirometry consistent with mild obstructive disease","Spirometry consistent with moderate obstructive disease","Spirometry consistent with severe obstructive disease","Spirometry consistent with possible restrictive disease","Spirometry consistent with mixed obstructive and restrictive disease","Spirometry uninterpretable due to technique","Spirometry consistent with normal pattern","No overt abnormalities noted given today's efforts"}.  Please see scanned spirometry results for details.  Skin Testing: {Blank single:19197::"Select foods","Environmental allergy panel","Environmental allergy panel and select foods","Food allergy panel","None","Deferred due to recent antihistamines use"}. *** Results discussed with patient/family.   Medication List:  Current Outpatient Medications  Medication Sig Dispense Refill   acetaminophen (TYLENOL) 500 MG tablet Take 1,000 mg by mouth every 6 (six) hours as needed for moderate pain.     acyclovir (ZOVIRAX) 200 MG capsule Take by mouth.     albuterol (PROAIR HFA) 108 (90 Base) MCG/ACT inhaler Inhale 2 puffs into the lungs every 4 (four) hours as needed for wheezing or shortness of breath (coughing fits). 18 g 1   albuterol (PROVENTIL) (2.5 MG/3ML) 0.083% nebulizer solution Take 2.5 mg by nebulization every 6 (six) hours as needed for wheezing or shortness of breath.     Ciclopirox 1 % shampoo Apply 1 Bottle topically at bedtime.     clindamycin  (CLINDAGEL) 1 % gel Apply topically 2 (two) times daily.     desonide (DESOWEN) 0.05 % ointment Apply sparingly to affected areas twice daily as needed to the face and/or neck. 15 g 5   dexmethylphenidate (FOCALIN XR) 20 MG 24 hr capsule Take 20 mg by mouth every morning.     dexmethylphenidate (FOCALIN) 10 MG tablet Take 10 mg by mouth daily.     docusate sodium (COLACE) 100 MG capsule Take 100 mg by mouth daily as needed for mild constipation.     DUPIXENT 300 MG/2ML SOPN Inject into the skin.     EPINEPHrine 0.3 mg/0.3 mL IJ SOAJ injection Inject 0.3 mg into the muscle daily as needed (allergic reaction). 1 each 2   EUCRISA 2 % OINT Apply topically 2 (two) times daily.     fluticasone (CUTIVATE) 0.05 % cream Apply topically.     fluticasone (FLONASE) 50 MCG/ACT nasal spray Place 1-2 sprays into both nostrils daily as needed for rhinitis. 16 g 5   fluticasone (FLOVENT HFA) 44 MCG/ACT inhaler Inhale 2 puffs into the lungs 2 (two) times daily. 1 each 12   hydrocortisone cream 0.5 % Apply topically.     ketoconazole (NIZORAL) 2 % cream Apply topically daily as needed (athlete's foot). 60 g 1   Lactobacillus-Inulin (CULTURELLE DIGESTIVE HEALTH PO) Take 1 capsule by mouth daily.     levocetirizine (XYZAL) 5 MG tablet Take 5 mg by mouth every evening.     LORazepam (ATIVAN) 0.5 MG tablet Take 0.5 mg by mouth daily as needed for anxiety.     mupirocin ointment (BACTROBAN) 2 % SMARTSIG:1  Application Topical 2-3 Times Daily     Omega-3 Fatty Acids (FISH OIL) 645 MG CAPS Take 640 mg by mouth 3 (three) times daily.      Polyethyl Glycol-Propyl Glycol (SYSTANE ULTRA OP) Apply 1 drop to eye daily as needed.     sertraline (ZOLOFT) 50 MG tablet Take 50 mg by mouth daily.     Soap & Cleansers (VANICREAM CLEANSER EX)      Spacer/Aero-Holding Chambers (AEROCHAMBER PLUS FLO-VU LARGE) MISC      Spacer/Aero-Holding Rudean Curt Use with MDI inhaler 1 each 0   terbinafine (LAMISIL) 1 % cream Apply 1 application   topically 2 (two) times daily.     triamcinolone ointment (KENALOG) 0.1 % Apply sparingly to affected areas twice daily as needed, below the face. 30 g 5   No current facility-administered medications for this visit.   Allergies: Allergies  Allergen Reactions   Eggs Or Egg-Derived Products Anaphylaxis   Other Anaphylaxis    Allergic to ALL NUTS   Peanut Oil Anaphylaxis   Shellfish Allergy Anaphylaxis   Coconut (Cocos Nucifera) Rash   Haloperidol Other (See Comments)    Drowsy for days   Peanut-Containing Drug Products    I reviewed his past medical history, social history, family history, and environmental history and no significant changes have been reported from his previous visit.  Review of Systems  Constitutional:  Negative for appetite change, chills, fever and unexpected weight change.  HENT:  Negative for congestion and rhinorrhea.   Eyes:  Negative for itching.  Respiratory:  Negative for cough, chest tightness, shortness of breath and wheezing.   Gastrointestinal:  Negative for abdominal pain.  Skin:  Positive for rash.  Allergic/Immunologic: Positive for environmental allergies and food allergies.    Objective: There were no vitals taken for this visit. There is no height or weight on file to calculate BMI. Physical Exam Vitals and nursing note reviewed.  Constitutional:      Appearance: He is well-developed.  HENT:     Head: Normocephalic and atraumatic.     Right Ear: Tympanic membrane and external ear normal.     Left Ear: Tympanic membrane and external ear normal.     Nose: Nose normal.     Mouth/Throat:     Mouth: Mucous membranes are moist.     Pharynx: Oropharynx is clear. No oropharyngeal exudate or posterior oropharyngeal erythema.  Eyes:     Conjunctiva/sclera: Conjunctivae normal.  Cardiovascular:     Rate and Rhythm: Normal rate and regular rhythm.     Heart sounds: Normal heart sounds. No murmur heard. Pulmonary:     Effort: Pulmonary effort  is normal.     Breath sounds: Normal breath sounds. No wheezing, rhonchi or rales.  Musculoskeletal:     Cervical back: Neck supple.  Skin:    General: Skin is warm.     Findings: Rash present.     Comments: No rash on face. Few erythematous areas on the back.   Neurological:     Mental Status: He is alert. Mental status is at baseline.    Previous notes and tests were reviewed. The plan was reviewed with the patient/family, and all questions/concerned were addressed.  It was my pleasure to see Rudie today and participate in his care. Please feel free to contact me with any questions or concerns.  Sincerely,  Wyline Mood, DO Allergy & Immunology  [facility] office: [phone] [facility] office: [phone]

## 2022-05-28 ENCOUNTER — Other Ambulatory Visit: Payer: Self-pay

## 2022-05-28 ENCOUNTER — Encounter: Payer: Self-pay | Admitting: Allergy

## 2022-05-28 ENCOUNTER — Ambulatory Visit (INDEPENDENT_AMBULATORY_CARE_PROVIDER_SITE_OTHER): Payer: Medicare Other | Admitting: Allergy

## 2022-05-28 ENCOUNTER — Ambulatory Visit: Payer: Self-pay

## 2022-05-28 VITALS — BP 120/80 | HR 81 | Temp 98.4°F | Resp 18 | Ht 68.0 in | Wt 170.4 lb

## 2022-05-28 DIAGNOSIS — J4530 Mild persistent asthma, uncomplicated: Secondary | ICD-10-CM

## 2022-05-28 DIAGNOSIS — T7800XD Anaphylactic reaction due to unspecified food, subsequent encounter: Secondary | ICD-10-CM | POA: Diagnosis not present

## 2022-05-28 DIAGNOSIS — H1010 Acute atopic conjunctivitis, unspecified eye: Secondary | ICD-10-CM

## 2022-05-28 DIAGNOSIS — L2089 Other atopic dermatitis: Secondary | ICD-10-CM

## 2022-05-28 DIAGNOSIS — J302 Other seasonal allergic rhinitis: Secondary | ICD-10-CM | POA: Diagnosis not present

## 2022-05-28 DIAGNOSIS — H1013 Acute atopic conjunctivitis, bilateral: Secondary | ICD-10-CM | POA: Diagnosis not present

## 2022-05-28 NOTE — Assessment & Plan Note (Signed)
Past history - 2018 skin testing positive to almond, hazelnut, coconut. 2021 bloodwork positive to scallop, tree nuts, peanuts and eggs. Borderline positive to coconut, clam, shrimp and lobster. Component panel for the peanuts and tree nuts showed - more likely to have anaphylactic reactions to peanuts, hazelnuts, walnuts, cashew and [place] nuts. Interim history - tolerates baked eggs but had exposures to straight eggs which flare his eczema. No reactions requiring benadryl or epi. . Continue strict avoidance of peanuts, tree nuts, eggs, shellfish, mollusks (clam, scallop), coconut for now.  Faythe Ghee to eat baked egg items.  . For mild symptoms you can take over the counter antihistamines such as Benadryl and monitor symptoms closely. If symptoms worsen or if you have severe symptoms including breathing issues, throat closure, significant swelling, whole body hives, severe diarrhea and vomiting, lightheadedness then inject epinephrine and seek immediate medical care afterwards. . Get repeat bloodwork next year.

## 2022-05-28 NOTE — Patient Instructions (Addendum)
Asthma  Normal breathing test today. Daily controller medication(s): none.  During upper respiratory infections/flares:  Start Flovent 76mcg 2 puffs twice a day with spacer and rinse mouth afterwards for 1-2 weeks until your breathing symptoms return to baseline.  Pretreat with albuterol 2 puffs or albuterol nebulizer.  If you need to use your albuterol nebulizer machine back to back within 15-30 minutes with no relief then please go to the ER/urgent care for further evaluation.  May use albuterol rescue inhaler 2 puffs or nebulizer every 4 to 6 hours as needed for shortness of breath, chest tightness, coughing, and wheezing. Monitor frequency of use.  Asthma control goals:  Full participation in all desired activities (may need albuterol before activity) Albuterol use two times or less a week on average (not counting use with activity) Cough interfering with sleep two times or less a month Oral steroids no more than once a year No hospitalizations   Seasonal and perennial allergic rhinoconjunctivitis 2022 bloodwork positive to dust mites, mold, tree pollen and ragweed pollen.  Borderline positive to weed pollen, grass pollen, cockroach, cat and dog.  Continue environmental control measures.  Use over the counter antihistamines such as Zyrtec (cetirizine), Claritin (loratadine), Allegra (fexofenadine), or Xyzal (levocetirizine) daily as needed. May take twice a day during allergy flares. May switch antihistamines every few months. Use Flonase (fluticasone) nasal spray 1 spray per nostril 1-2 times a day as needed for nasal congestion.  Nasal saline spray (i.e., Simply Saline) is recommended as needed especially after being outdoors for a prolonged time.  Continue allergy injections.   Food allergy  Continue strict avoidance of peanuts, tree nuts, eggs, shellfish, mollusks (clam, scallop), coconut for now.  Okay to eat baked egg items.  For mild symptoms you can take over the counter  antihistamines such as Benadryl and monitor symptoms closely. If symptoms worsen or if you have severe symptoms including breathing issues, throat closure, significant swelling, whole body hives, severe diarrhea and vomiting, lightheadedness then inject epinephrine and seek immediate medical care afterwards.   Atopic dermatitis Follow up with dermatology and their recommendations. 2023 patch testing positive to gold, carba mix, epoxy resin, potassium dichromate, p-tert-butylphenol formaldehyde resin (done by derm).  Avoid items that were positive on patch testing Continue Dupixent injections every 2 weeks.  Continue proper skin care - use fragrance free and dye free shampoo such as vanicream.  Moisturize the eyelid the best as possible.  May use desonide 0.05% ointment twice a day as needed for mild eczema flares - okay to use on the face, neck, groin area. Do not use more than 1 week at a time. Use Eucrisa (crisaborole) 2% ointment twice a day on mild rash flares on the face and body. This is a non-steroid ointment  Follow up in 4 months or sooner if needed.

## 2022-05-28 NOTE — Assessment & Plan Note (Signed)
Past history - 2018 bloodwork - IgE 6992.  Positive to dust mites, cat, dog, grass, mold, tree, weed, ragweed. Does not like to use eye drops. 2022 bloodwork positive to dust mites, mold, tree pollen and ragweed pollen. Borderline positive to weed pollen, grass pollen, cockroach, cat and dog. Started AIT on 04/07/2021 (G-Rw-W-T & M-Dm-C-D) Interim history - improvement in symptoms this year. Some localized reaction with AIT. Marland Kitchen Continue environmental control measures.  . Use over the counter antihistamines such as Zyrtec (cetirizine), Claritin (loratadine), Allegra (fexofenadine), or Xyzal (levocetirizine) daily as needed. May take twice a day during allergy flares. May switch antihistamines every few months. . Use Flonase (fluticasone) nasal spray 1 spray per nostril 1-2 times a day as needed for nasal congestion.  . Nasal saline spray (i.e., Simply Saline) is recommended as needed especially after being outdoors for a prolonged time.  . Continue allergy injections.

## 2022-05-28 NOTE — Assessment & Plan Note (Signed)
Past history - managed by derm. 2023 patch testing positive to gold, carba mix, epoxy resin, potassium dichromate, p-tert-butylphenol formaldehyde resin (done by derm) Interim history - waxes and wanes and currently flared. Seeing derm this week.  Follow up with dermatology as scheduled.  Avoid items that were positive on patch testing - still uses oxyclean.  . Continue Dupixent injections every 2 weeks.  . Continue proper skin care - use fragrance free and dye free shampoo such as vanicream.  . Moisturize the eyelid the best as possible.  . May use desonide 0.05% ointment twice a day as needed for mild eczema flares - okay to use on the face, neck, groin area. Do not use more than 1 week at a time. . Use Eucrisa (crisaborole) 2% ointment twice a day on mild rash flares on the face and body. This is a non-steroid ointment

## 2022-05-28 NOTE — Assessment & Plan Note (Signed)
Used Flovent for a few weeks in the summer but now asymptomatic.  Today's spirometry was unremarkable.  . Daily controller medication(s): none.  . During upper respiratory infections/flares:  . Start Flovent 52mcg 2 puffs twice a day with spacer and rinse mouth afterwards for 1-2 weeks until your breathing symptoms return to baseline.  . Pretreat with albuterol 2 puffs or albuterol nebulizer.  . If you need to use your albuterol nebulizer machine back to back within 15-30 minutes with no relief then please go to the ER/urgent care for further evaluation.  . May use albuterol rescue inhaler 2 puffs or nebulizer every 4 to 6 hours as needed for shortness of breath, chest tightness, coughing, and wheezing. Monitor frequency of use.

## 2022-05-29 DIAGNOSIS — L2084 Intrinsic (allergic) eczema: Secondary | ICD-10-CM | POA: Diagnosis not present

## 2022-05-29 DIAGNOSIS — Z5181 Encounter for therapeutic drug level monitoring: Secondary | ICD-10-CM | POA: Diagnosis not present

## 2022-05-29 DIAGNOSIS — L209 Atopic dermatitis, unspecified: Secondary | ICD-10-CM | POA: Diagnosis not present

## 2022-05-29 DIAGNOSIS — Z7689 Persons encountering health services in other specified circumstances: Secondary | ICD-10-CM | POA: Diagnosis not present

## 2022-05-30 DIAGNOSIS — F419 Anxiety disorder, unspecified: Secondary | ICD-10-CM | POA: Diagnosis not present

## 2022-06-04 ENCOUNTER — Ambulatory Visit (INDEPENDENT_AMBULATORY_CARE_PROVIDER_SITE_OTHER): Payer: Medicare Other

## 2022-06-04 DIAGNOSIS — J309 Allergic rhinitis, unspecified: Secondary | ICD-10-CM | POA: Diagnosis not present

## 2022-06-18 ENCOUNTER — Ambulatory Visit (INDEPENDENT_AMBULATORY_CARE_PROVIDER_SITE_OTHER): Payer: Medicare Other | Admitting: *Deleted

## 2022-06-18 DIAGNOSIS — J309 Allergic rhinitis, unspecified: Secondary | ICD-10-CM | POA: Diagnosis not present

## 2022-06-20 ENCOUNTER — Telehealth: Payer: Self-pay | Admitting: Allergy

## 2022-06-20 NOTE — Telephone Encounter (Signed)
Pts insurance will no longer cover flovent and stated that Arnuity ellipta is covered and mom wanted to know about side effects of arnuity if your going to prescribe it.

## 2022-06-20 NOTE — Telephone Encounter (Signed)
Please call patient.  Arnuity and Flovent are in the same group of medications - inhaled steroids.  Most common side effect is thrush.  Recommend that he brushes his teeth or rinses his mouth after each use.   Let me know what pharmacy they want me to send in the script to.

## 2022-06-20 NOTE — Telephone Encounter (Signed)
Pts mom said long as your ok with the switch she is okay and right now he dosent need the arnuity as he still has some flovent left and is not needing it daily she will call when she needs [place] to send in the arnuity

## 2022-06-20 NOTE — Telephone Encounter (Signed)
Pt's mom received a letter about Insurance not covering flovent, she would like a call back.

## 2022-06-22 ENCOUNTER — Other Ambulatory Visit: Payer: Self-pay | Admitting: Allergy

## 2022-07-02 ENCOUNTER — Ambulatory Visit (INDEPENDENT_AMBULATORY_CARE_PROVIDER_SITE_OTHER): Payer: Medicare Other

## 2022-07-02 DIAGNOSIS — J309 Allergic rhinitis, unspecified: Secondary | ICD-10-CM | POA: Diagnosis not present

## 2022-07-04 DIAGNOSIS — J301 Allergic rhinitis due to pollen: Secondary | ICD-10-CM

## 2022-07-04 NOTE — Progress Notes (Signed)
VIALS EXP 07-05-23 

## 2022-07-05 DIAGNOSIS — J3089 Other allergic rhinitis: Secondary | ICD-10-CM | POA: Diagnosis not present

## 2022-07-16 ENCOUNTER — Ambulatory Visit (INDEPENDENT_AMBULATORY_CARE_PROVIDER_SITE_OTHER): Payer: Medicare Other

## 2022-07-16 DIAGNOSIS — J309 Allergic rhinitis, unspecified: Secondary | ICD-10-CM | POA: Diagnosis not present

## 2022-08-01 ENCOUNTER — Ambulatory Visit (INDEPENDENT_AMBULATORY_CARE_PROVIDER_SITE_OTHER): Payer: Medicare Other

## 2022-08-01 DIAGNOSIS — J309 Allergic rhinitis, unspecified: Secondary | ICD-10-CM

## 2022-08-13 ENCOUNTER — Ambulatory Visit (INDEPENDENT_AMBULATORY_CARE_PROVIDER_SITE_OTHER): Payer: Medicare Other

## 2022-08-13 DIAGNOSIS — J309 Allergic rhinitis, unspecified: Secondary | ICD-10-CM

## 2022-08-14 DIAGNOSIS — E785 Hyperlipidemia, unspecified: Secondary | ICD-10-CM | POA: Diagnosis not present

## 2022-08-14 DIAGNOSIS — D582 Other hemoglobinopathies: Secondary | ICD-10-CM | POA: Diagnosis not present

## 2022-08-14 DIAGNOSIS — Z91010 Allergy to peanuts: Secondary | ICD-10-CM | POA: Diagnosis not present

## 2022-08-14 DIAGNOSIS — J4520 Mild intermittent asthma, uncomplicated: Secondary | ICD-10-CM | POA: Diagnosis not present

## 2022-08-14 DIAGNOSIS — Z0000 Encounter for general adult medical examination without abnormal findings: Secondary | ICD-10-CM | POA: Diagnosis not present

## 2022-08-14 DIAGNOSIS — L2084 Intrinsic (allergic) eczema: Secondary | ICD-10-CM | POA: Diagnosis not present

## 2022-08-14 DIAGNOSIS — I4510 Unspecified right bundle-branch block: Secondary | ICD-10-CM | POA: Diagnosis not present

## 2022-08-14 DIAGNOSIS — L309 Dermatitis, unspecified: Secondary | ICD-10-CM | POA: Diagnosis not present

## 2022-08-14 DIAGNOSIS — Z136 Encounter for screening for cardiovascular disorders: Secondary | ICD-10-CM | POA: Diagnosis not present

## 2022-08-14 DIAGNOSIS — J309 Allergic rhinitis, unspecified: Secondary | ICD-10-CM | POA: Diagnosis not present

## 2022-08-14 DIAGNOSIS — F411 Generalized anxiety disorder: Secondary | ICD-10-CM | POA: Diagnosis not present

## 2022-08-15 DIAGNOSIS — F419 Anxiety disorder, unspecified: Secondary | ICD-10-CM | POA: Diagnosis not present

## 2022-08-27 ENCOUNTER — Ambulatory Visit (INDEPENDENT_AMBULATORY_CARE_PROVIDER_SITE_OTHER): Payer: Medicare Other

## 2022-08-27 ENCOUNTER — Telehealth: Payer: Self-pay | Admitting: *Deleted

## 2022-08-27 DIAGNOSIS — J309 Allergic rhinitis, unspecified: Secondary | ICD-10-CM | POA: Diagnosis not present

## 2022-08-27 MED ORDER — TRIAMCINOLONE ACETONIDE 0.1 % EX OINT: 1.0000 | TOPICAL_OINTMENT | Freq: Two times a day (BID) | CUTANEOUS | 1 refills | Status: AC | PRN

## 2022-08-27 NOTE — Telephone Encounter (Signed)
Called and spoke with the patients dad and he states that they do have desonide and eucrisa. He states that his eczema is flared on his neck and arm. His mom and dad said that it is not bad enough to need an oral steroid. They are having issues sending pictures because they don't have access to his MyChart.

## 2022-08-27 NOTE — Telephone Encounter (Signed)
Patient's father came in with Merry Proud to get his allergy shot today and stated that Merry Proud went swimming at the [facility] yesterday and the chlorine from the pool has flared his eczema pretty bad. He stated that they started using the [place] today, he was wondering if you had any further recommendation. Patient did have some eczema flare on his arms and some on his face from what was visible. Patient's father would like a call back to his phone number.

## 2022-08-27 NOTE — Telephone Encounter (Addendum)
Called patient's parents, Francisco Cisneros/Francisco Cisneros - DOB/DPR verified - advised of provider notation below.  Both parents verbalized understanding, no further questions.

## 2022-08-27 NOTE — Telephone Encounter (Signed)
For the neck: Use desonide 0.05% ointment twice a day as needed for mild rash flares - okay to use on the face, neck, groin area. Do not use more than 1 week at a time.  For the arms - I will send in triamcinolone.  Use triamcinolone 0.1% ointment twice a day as needed for rash flares. Do not use on the face, neck, armpits or groin area. Do not use more than 3 weeks in a row.

## 2022-08-27 NOTE — Telephone Encounter (Signed)
Please call patient back.  Where is it flared? Do they have the topical steroid creams at home?  I have on his list desonide and triamcinolone.  Is it bad enough to need prednisone? Can he send pictures via mychart?

## 2022-08-27 NOTE — Addendum Note (Signed)
Addended by: Garnet Sierras on: 08/27/2022 05:10 PM   Modules accepted: Orders

## 2022-09-03 ENCOUNTER — Ambulatory Visit (INDEPENDENT_AMBULATORY_CARE_PROVIDER_SITE_OTHER): Payer: Medicare Other

## 2022-09-03 DIAGNOSIS — J309 Allergic rhinitis, unspecified: Secondary | ICD-10-CM

## 2022-09-10 ENCOUNTER — Ambulatory Visit (INDEPENDENT_AMBULATORY_CARE_PROVIDER_SITE_OTHER): Payer: Medicare Other

## 2022-09-10 DIAGNOSIS — J309 Allergic rhinitis, unspecified: Secondary | ICD-10-CM

## 2022-09-17 ENCOUNTER — Ambulatory Visit (INDEPENDENT_AMBULATORY_CARE_PROVIDER_SITE_OTHER): Payer: Medicare Other | Admitting: *Deleted

## 2022-09-17 DIAGNOSIS — J309 Allergic rhinitis, unspecified: Secondary | ICD-10-CM | POA: Diagnosis not present

## 2022-09-24 ENCOUNTER — Ambulatory Visit: Payer: Self-pay | Admitting: *Deleted

## 2022-09-30 NOTE — Progress Notes (Unsigned)
Follow Up Note  RE: Francisco Cisneros MRN: [id number] DOB: 1985-12-02 Date of Office Visit: 10/01/2022  Referring provider: [name], MD Primary care provider: [name], MD  Chief Complaint: No chief complaint on file.  History of Present Illness: I had the pleasure of seeing Francisco Cisneros for a follow up visit at the [facility] on 09/30/2022. He is a 37 y.o. male, who is being followed for asthma, allergic rhinoconjunctivitis on AIT, food allergy, atopic dermatitis. His previous allergy office visit was on 05/28/2022 with Dr. [name]. Today is a regular follow up visit.  Mild persistent asthma without complication Used Flovent for a few weeks in the summer but now asymptomatic. Today's spirometry was unremarkable.  Daily controller medication(s): none.  During upper respiratory infections/flares:  Start Flovent 83mg 2 puffs twice a day with spacer and rinse mouth afterwards for 1-2 weeks until your breathing symptoms return to baseline.  Pretreat with albuterol 2 puffs or albuterol nebulizer.  If you need to use your albuterol nebulizer machine back to back within 15-30 minutes with no relief then please go to the ER/urgent care for further evaluation.  May use albuterol rescue inhaler 2 puffs or nebulizer every 4 to 6 hours as needed for shortness of breath, chest tightness, coughing, and wheezing. Monitor frequency of use.    Seasonal and perennial allergic rhinoconjunctivitis Past history - 2018 bloodwork - IgE 6992.  Positive to dust mites, cat, dog, grass, mold, tree, weed, ragweed. Does not like to use eye drops. 2022 bloodwork positive to dust mites, mold, tree pollen and ragweed pollen.  Borderline positive to weed pollen, grass pollen, cockroach, cat and dog. Started AIT on 04/07/2021 (G-Rw-W-T & M-Dm-C-D) Interim history - improvement in symptoms this year. Some localized reaction with AIT. Continue environmental control measures.  Use over the counter  antihistamines such as Zyrtec (cetirizine), Claritin (loratadine), Allegra (fexofenadine), or Xyzal (levocetirizine) daily as needed. May take twice a day during allergy flares. May switch antihistamines every few months. Use Flonase (fluticasone) nasal spray 1 spray per nostril 1-2 times a day as needed for nasal congestion.  Nasal saline spray (i.e., Simply Saline) is recommended as needed especially after being outdoors for a prolonged time.  Continue allergy injections.   Anaphylactic reaction due to food, subsequent encounter Past history - 2018 skin testing positive to almond, hazelnut, coconut. 2021 bloodwork positive to scallop, tree nuts, peanuts and eggs. Borderline positive to coconut, clam, shrimp and lobster. Component panel for the peanuts and tree nuts showed - more likely to have anaphylactic reactions to peanuts, hazelnuts, walnuts, cashew and bBolivianuts. Interim history - tolerates baked eggs but had exposures to straight eggs which flare his eczema. No reactions requiring benadryl or epi. Continue strict avoidance of peanuts, tree nuts, eggs, shellfish, mollusks (clam, scallop), coconut for now.  Okay to eat baked egg items.  For mild symptoms you can take over the counter antihistamines such as Benadryl and monitor symptoms closely. If symptoms worsen or if you have severe symptoms including breathing issues, throat closure, significant swelling, whole body hives, severe diarrhea and vomiting, lightheadedness then inject epinephrine and seek immediate medical care afterwards. Get repeat bloodwork next year.    Atopic dermatitis Past history - managed by derm. 2023 patch testing positive to gold, carba mix, epoxy resin, potassium dichromate, p-tert-butylphenol formaldehyde resin (done by derm) Interim history - waxes and wanes and currently flared. Seeing derm this week. Follow up with dermatology as scheduled. Avoid items that  were positive on patch testing - still uses  oxyclean.  Continue Dupixent injections every 2 weeks.  Continue proper skin care - use fragrance free and dye free shampoo such as vanicream.  Moisturize the eyelid the best as possible.  May use desonide 0.05% ointment twice a day as needed for mild eczema flares - okay to use on the face, neck, groin area. Do not use more than 1 week at a time. Use Eucrisa (crisaborole) 2% ointment twice a day on mild rash flares on the face and body. This is a non-steroid ointment   Return in about 4 months (around 09/28/2022).  Assessment and Plan: Francisco Cisneros is a 37 y.o. male with: No problem-specific Assessment & Plan notes found for this encounter.  No follow-ups on file.  No orders of the defined types were placed in this encounter.  Lab Orders  No laboratory test(s) ordered today    Diagnostics: Spirometry:  Tracings reviewed. His effort: {Blank single:19197::"Good reproducible efforts.","It was hard to get consistent efforts and there is a question as to whether this reflects a maximal maneuver.","Poor effort, data can not be interpreted."} FVC: ***L FEV1: ***L, ***% predicted FEV1/FVC ratio: ***% Interpretation: {Blank single:19197::"Spirometry consistent with mild obstructive disease","Spirometry consistent with moderate obstructive disease","Spirometry consistent with severe obstructive disease","Spirometry consistent with possible restrictive disease","Spirometry consistent with mixed obstructive and restrictive disease","Spirometry uninterpretable due to technique","Spirometry consistent with normal pattern","No overt abnormalities noted given today's efforts"}.  Please see scanned spirometry results for details.  Skin Testing: {Blank single:19197::"Select foods","Environmental allergy panel","Environmental allergy panel and select foods","Food allergy panel","None","Deferred due to recent antihistamines use"}. *** Results discussed with patient/family.   Medication List:  Current  Outpatient Medications  Medication Sig Dispense Refill  . acetaminophen (TYLENOL) 500 MG tablet Take 1,000 mg by mouth every 6 (six) hours as needed for moderate pain.    Marland Kitchen acyclovir (ZOVIRAX) 200 MG capsule Take by mouth.    Marland Kitchen albuterol (PROVENTIL) (2.5 MG/3ML) 0.083% nebulizer solution Take 2.5 mg by nebulization every 6 (six) hours as needed for wheezing or shortness of breath.    Marland Kitchen albuterol (VENTOLIN HFA) 108 (90 Base) MCG/ACT inhaler USE 2 PUFFS BY MOUTH EVERY FOUR HOURS AS NEEDED FOR WHEEZING OR SHORTNESS OF BREATH 18 g 1  . Ciclopirox 1 % shampoo Apply 1 Bottle topically at bedtime.    . clindamycin (CLINDAGEL) 1 % gel Apply topically 2 (two) times daily.    Marland Kitchen desonide (DESOWEN) 0.05 % ointment Apply sparingly to affected areas twice daily as needed to the face and/or neck. 15 g 5  . dexmethylphenidate (FOCALIN XR) 20 MG 24 hr capsule Take 20 mg by mouth every morning.    Marland Kitchen dexmethylphenidate (FOCALIN) 10 MG tablet Take 10 mg by mouth daily.    Marland Kitchen docusate sodium (COLACE) 100 MG capsule Take 100 mg by mouth daily as needed for mild constipation.    . DUPIXENT 300 MG/2ML SOPN Inject into the skin.    Marland Kitchen EPINEPHrine 0.3 mg/0.3 mL IJ SOAJ injection Inject 0.3 mg into the muscle daily as needed (allergic reaction). 1 each 2  . EUCRISA 2 % OINT Apply topically 2 (two) times daily.    . fluticasone (CUTIVATE) 0.05 % cream Apply topically.    . fluticasone (FLONASE) 50 MCG/ACT nasal spray Place 1-2 sprays into both nostrils daily as needed for rhinitis. 16 g 5  . fluticasone (FLOVENT HFA) 44 MCG/ACT inhaler Inhale 2 puffs into the lungs 2 (two) times daily. 1 each 12  . hydrocortisone cream  0.5 % Apply topically.    Marland Kitchen ketoconazole (NIZORAL) 2 % cream Apply topically daily as needed (athlete's foot). 60 g 1  . Lactobacillus-Inulin (CULTURELLE DIGESTIVE HEALTH PO) Take 1 capsule by mouth daily.    Marland Kitchen levocetirizine (XYZAL) 5 MG tablet Take 5 mg by mouth every evening.    Marland Kitchen LORazepam (ATIVAN) 0.5 MG  tablet Take 0.5 mg by mouth daily as needed for anxiety.    . mupirocin ointment (BACTROBAN) 2 % SMARTSIG:1 Application Topical 2-3 Times Daily    . Omega-3 Fatty Acids (FISH OIL) 645 MG CAPS Take 640 mg by mouth 3 (three) times daily.     Vladimir Faster Glycol-Propyl Glycol (SYSTANE ULTRA OP) Apply 1 drop to eye daily as needed.    . sertraline (ZOLOFT) 50 MG tablet Take 50 mg by mouth daily.    . Soap & Cleansers [facility] CLEANSER EX)     . Spacer/Aero-Holding Chambers (AEROCHAMBER PLUS FLO-VU LARGE) MISC     . Spacer/Aero-Holding Dorise Bullion Use with MDI inhaler 1 each 0  . terbinafine (LAMISIL) 1 % cream Apply 1 application  topically 2 (two) times daily.    Marland Kitchen triamcinolone ointment (KENALOG) 0.1 % Apply 1 Application topically 2 (two) times daily as needed (rash flare). Do not use on the face, neck, armpits or groin area. Do not use more than 3 weeks in a row. 30 g 1   No current facility-administered medications for this visit.   Allergies: Allergies  Allergen Reactions  . Eggs Or Egg-Derived Products Anaphylaxis  . Other Anaphylaxis    Allergic to ALL NUTS  . Peanut Oil Anaphylaxis  . Shellfish Allergy Anaphylaxis  . Coconut (Cocos Nucifera) Rash  . Haloperidol Other (See Comments)    Drowsy for days  . Peanut-Containing Drug Products    I reviewed his past medical history, social history, family history, and environmental history and no significant changes have been reported from his previous visit.  Review of Systems  Constitutional:  Negative for appetite change, chills, fever and unexpected weight change.  HENT:  Negative for congestion and rhinorrhea.   Eyes:  Negative for itching.  Respiratory:  Negative for cough, chest tightness, shortness of breath and wheezing.   Gastrointestinal:  Negative for abdominal pain.  Skin:  Positive for rash.  Allergic/Immunologic: Positive for environmental allergies and food allergies.   Objective: There were no vitals taken for  this visit. There is no height or weight on file to calculate BMI. Physical Exam Vitals and nursing note reviewed.  Constitutional:      Appearance: He is well-developed.  HENT:     Head: Normocephalic and atraumatic.     Right Ear: Tympanic membrane and external ear normal.     Left Ear: Tympanic membrane and external ear normal.     Nose: Nose normal.     Mouth/Throat:     Mouth: Mucous membranes are moist.     Pharynx: Oropharynx is clear. No oropharyngeal exudate or posterior oropharyngeal erythema.  Eyes:     Conjunctiva/sclera: Conjunctivae normal.  Cardiovascular:     Rate and Rhythm: Normal rate and regular rhythm.     Heart sounds: Normal heart sounds. No murmur heard. Pulmonary:     Effort: Pulmonary effort is normal.     Breath sounds: Normal breath sounds. No wheezing, rhonchi or rales.  Musculoskeletal:     Cervical back: Neck supple.  Skin:    General: Skin is warm.     Findings: Rash present.  Comments: Dry erythematous patches on the neck, antecubital fossa area with some excoriation marks on the upper extremities b/l.  Neurological:     Mental Status: He is alert. Mental status is at baseline.  Previous notes and tests were reviewed. The plan was reviewed with the patient/family, and all questions/concerned were addressed.  It was my pleasure to see Francisco Cisneros today and participate in his care. Please feel free to contact me with any questions or concerns.  Sincerely,  Rexene Alberts, DO Allergy & Immunology  [facility] office: [place] office: [phone]

## 2022-10-01 ENCOUNTER — Encounter: Payer: Self-pay | Admitting: Allergy

## 2022-10-01 ENCOUNTER — Ambulatory Visit (INDEPENDENT_AMBULATORY_CARE_PROVIDER_SITE_OTHER): Payer: Medicare Other | Admitting: Allergy

## 2022-10-01 ENCOUNTER — Other Ambulatory Visit: Payer: Self-pay

## 2022-10-01 VITALS — BP 122/80 | HR 95 | Temp 98.5°F | Resp 18 | Ht 68.0 in

## 2022-10-01 DIAGNOSIS — H1013 Acute atopic conjunctivitis, bilateral: Secondary | ICD-10-CM | POA: Diagnosis not present

## 2022-10-01 DIAGNOSIS — T7800XD Anaphylactic reaction due to unspecified food, subsequent encounter: Secondary | ICD-10-CM

## 2022-10-01 DIAGNOSIS — L2089 Other atopic dermatitis: Secondary | ICD-10-CM

## 2022-10-01 DIAGNOSIS — L272 Dermatitis due to ingested food: Secondary | ICD-10-CM

## 2022-10-01 DIAGNOSIS — J309 Allergic rhinitis, unspecified: Secondary | ICD-10-CM | POA: Diagnosis not present

## 2022-10-01 DIAGNOSIS — H1010 Acute atopic conjunctivitis, unspecified eye: Secondary | ICD-10-CM

## 2022-10-01 DIAGNOSIS — J4530 Mild persistent asthma, uncomplicated: Secondary | ICD-10-CM | POA: Diagnosis not present

## 2022-10-01 NOTE — Assessment & Plan Note (Signed)
Has respiratory symptoms in January but did not require inhaler use at all.  Today's spirometry was normal. Daily controller medication(s): none.  During upper respiratory infections/flares:  Start Flovent 55mg 2 puffs twice a day with spacer and rinse mouth afterwards for 1-2 weeks until your breathing symptoms return to baseline.  Pretreat with albuterol 2 puffs or albuterol nebulizer.  If you need to use your albuterol nebulizer machine back to back within 15-30 minutes with no relief then please go to the ER/urgent care for further evaluation.  May use albuterol rescue inhaler 2 puffs or nebulizer every 4 to 6 hours as needed for shortness of breath, chest tightness, coughing, and wheezing. Monitor frequency of use.  The Dupixent that he is using for eczema is most likely also helping with his asthma.

## 2022-10-01 NOTE — Patient Instructions (Addendum)
You may try the buzzy with the injections and see if it helps.   Asthma  Normal breathing test today. Daily controller medication(s): none.  During upper respiratory infections/flares:  Start Flovent 36mg 2 puffs twice a day with spacer and rinse mouth afterwards for 1-2 weeks until your breathing symptoms return to baseline.  Pretreat with albuterol 2 puffs or albuterol nebulizer.  If you need to use your albuterol nebulizer machine back to back within 15-30 minutes with no relief then please go to the ER/urgent care for further evaluation.  May use albuterol rescue inhaler 2 puffs or nebulizer every 4 to 6 hours as needed for shortness of breath, chest tightness, coughing, and wheezing. Monitor frequency of use.  Asthma control goals:  Full participation in all desired activities (may need albuterol before activity) Albuterol use two times or less a week on average (not counting use with activity) Cough interfering with sleep two times or less a month Oral steroids no more than once a year No hospitalizations   Seasonal and perennial allergic rhinoconjunctivitis 2022 bloodwork positive to dust mites, mold, tree pollen and ragweed pollen.  Borderline positive to weed pollen, grass pollen, cockroach, cat and dog.  Continue environmental control measures.  Use over the counter antihistamines such as Zyrtec (cetirizine), Claritin (loratadine), Allegra (fexofenadine), or Xyzal (levocetirizine) daily as needed. May take twice a day during allergy flares. May switch antihistamines every few months. Use Flonase (fluticasone) nasal spray 1 spray per nostril 1-2 times a day as needed for nasal congestion.  Nasal saline spray (i.e., Simply Saline) is recommended as needed especially after being outdoors for a prolonged time.  Continue allergy injections - given today.    Food allergy  Continue strict avoidance of peanuts, tree nuts, eggs, shellfish, mollusks (clam, scallop), coconut for now.  Okay  to eat baked egg items.  Get bloodwork We are ordering labs, so please allow 1-2 weeks for the results to come back. With the newly implemented Cures Act, the labs might be visible to you at the same time that they become visible to me. However, I will not address the results until all of the results are back, so please be patient.  In the meantime, continue recommendations in your patient instructions, including avoidance measures (if applicable), until you hear from me.  For mild symptoms you can take over the counter antihistamines such as Benadryl and monitor symptoms closely. If symptoms worsen or if you have severe symptoms including breathing issues, throat closure, significant swelling, whole body hives, severe diarrhea and vomiting, lightheadedness then inject epinephrine and seek immediate medical care afterwards.   Atopic dermatitis Follow up with dermatology and their recommendations. 2023 patch testing positive to gold, carba mix, epoxy resin, potassium dichromate, p-tert-butylphenol formaldehyde resin (done by derm).  Avoid items that were positive on patch testing Continue Dupixent injections every 2 weeks.  Continue proper skin care - use fragrance free and dye free shampoo such as vanicream.  Moisturize the eyelid the best as possible.  May use desonide 0.05% ointment twice a day as needed for mild eczema flares - okay to use on the face, neck, groin area. Do not use more than 1 week at a time. Use Eucrisa (crisaborole) 2% ointment twice a day on mild rash flares on the face and body. This is a non-steroid ointment  Follow up in 6 months or sooner if needed.

## 2022-10-01 NOTE — Assessment & Plan Note (Signed)
Past history - managed by derm. 2023 patch testing positive to gold, carba mix, epoxy resin, potassium dichromate, p-tert-butylphenol formaldehyde resin (done by derm) Interim history - sometimes flares but not as bad as before. Complaining of pain with injections but has been self-injecting now. Worked up for R[place] but parents concerned about side effect profile.  You may try the buzzy with the injections and see if it helps.  Follow up with dermatology and their recommendations. Avoid items that were positive on patch testing Continue Dupixent injections every 2 weeks - this is also helping with his asthma control.  Continue proper skin care - use fragrance free and dye free shampoo such as vanicream.  Moisturize the eyelid the best as possible.  May use desonide 0.05% ointment twice a day as needed for mild eczema flares - okay to use on the face, neck, groin area. Do not use more than 1 week at a time. Use Eucrisa (crisaborole) 2% ointment twice a day on mild rash flares on the face and body. This is a non-steroid ointment.

## 2022-10-01 NOTE — Assessment & Plan Note (Signed)
Past history - 2018 skin testing positive to almond, hazelnut, coconut. 2021 bloodwork positive to scallop, tree nuts, peanuts and eggs. Borderline positive to coconut, clam, shrimp and lobster. Component panel for the peanuts and tree nuts showed - more likely to have anaphylactic reactions to peanuts, hazelnuts, walnuts, cashew and [place] nuts. Interim history - tolerates baked eggs but parents concerned if he is having unknown exposures which flare his eczema. No reactions requiring benadryl or epi. Continue strict avoidance of peanuts, tree nuts, eggs, shellfish, mollusks (clam, scallop), coconut for now.  Okay to eat baked egg items.  Get bloodwork For mild symptoms you can take over the counter antihistamines such as Benadryl and monitor symptoms closely. If symptoms worsen or if you have severe symptoms including breathing issues, throat closure, significant swelling, whole body hives, severe diarrhea and vomiting, lightheadedness then inject epinephrine and seek immediate medical care afterwards.

## 2022-10-01 NOTE — Assessment & Plan Note (Signed)
Past history - 2018 bloodwork - IgE 6992.  Positive to dust mites, cat, dog, grass, mold, tree, weed, ragweed. Does not like to use eye drops. 2022 bloodwork positive to dust mites, mold, tree pollen and ragweed pollen.  Borderline positive to weed pollen, grass pollen, cockroach, cat and dog. Started AIT on 04/07/2021 (G-Rw-[id number] & M-Dm-C-D) Interim history - doing much better but complaining of pain with the injections. You may try the buzzy with the injections and see if it helps.  Continue environmental control measures.  Use over the counter antihistamines such as Zyrtec (cetirizine), Claritin (loratadine), Allegra (fexofenadine), or Xyzal (levocetirizine) daily as needed. May take twice a day during allergy flares. May switch antihistamines every few months. Use Flonase (fluticasone) nasal spray 1 spray per nostril 1-2 times a day as needed for nasal congestion.  Nasal saline spray (i.e., Simply Saline) is recommended as needed especially after being outdoors for a prolonged time.  Continue allergy injections - given today.

## 2022-10-04 LAB — IGE NUT PROF. W/COMPONENT RFLX

## 2022-10-05 LAB — IGE NUT PROF. W/COMPONENT RFLX
F017-IgE Hazelnut (Filbert): 0.84 kU/L — AB
F018-IgE Brazil Nut: 5.59 kU/L — AB
F020-IgE Almond: 0.18 kU/L — AB
F202-IgE Cashew Nut: 1.02 kU/L — AB
F203-IgE Pistachio Nut: 1.24 kU/L — AB
F256-IgE Walnut: 1.06 kU/L — AB
Macadamia Nut, IgE: 0.41 kU/L — AB
Peanut, IgE: 0.43 kU/L — AB
Pecan Nut IgE: 0.11 kU/L — AB

## 2022-10-05 LAB — PEANUT COMPONENTS
F352-IgE Ara h 8: <0.1 kU/L
F422-IgE Ara h 1: <0.1 kU/L
F423-IgE Ara h 2: 0.15 kU/L — AB
F424-IgE Ara h 3: <0.1 kU/L
F427-IgE Ara h 9: <0.1 kU/L
F447-IgE Ara h 6: <0.1 kU/L

## 2022-10-05 LAB — PANEL 604726
Cor A 1 IgE: <0.1 kU/L
Cor A 14 IgE: 1.85 kU/L — AB
Cor A 8 IgE: <0.1 kU/L
Cor A 9 IgE: 0.23 kU/L — AB

## 2022-10-05 LAB — ALLERGEN PROFILE, SHELLFISH
Clam IgE: <0.1 kU/L
F023-IgE Crab: <0.1 kU/L
F080-IgE Lobster: <0.1 kU/L
F290-IgE Oyster: <0.1 kU/L
Scallop IgE: 0.17 kU/L — AB
Shrimp IgE: <0.1 kU/L

## 2022-10-05 LAB — PANEL 604721
Jug R 1 IgE: 1.07 kU/L — AB
Jug R 3 IgE: <0.1 kU/L

## 2022-10-05 LAB — PANEL 604350: Ber E 1 IgE: 8.7 kU/L — AB

## 2022-10-05 LAB — ALLERGEN COMPONENT COMMENTS

## 2022-10-05 LAB — IGE EGG WHITE W/COMPONENT RFLX: F001-IgE Egg White: 0.2 kU/L — AB

## 2022-10-05 LAB — PANEL 604239: ANA O 3 IgE: 0.65 kU/L — AB

## 2022-10-05 LAB — ALLERGEN COCONUT IGE: Allergen Coconut IgE: <0.1 kU/L

## 2022-10-05 NOTE — Progress Notes (Signed)
Reviewed bloodwork.  Egg was only borderline positive. Tree nuts and peanuts still positive but better than before. Shellfish panel essentially negative.   More likely to have anaphylactic reaction to hazelnuts, walnuts, cashews and [place] nuts. The numbers were much lower than before though which is great.   Given these results - we can do some selective food challenges such as to almond milk, mollusks, peanuts, coconut.   I recommend that we wait to do food challenges when it's not pollen season and do it in the winter months so his allergies don't flare up when he has to come off his allergy medications for the challenge.

## 2022-10-08 ENCOUNTER — Ambulatory Visit (INDEPENDENT_AMBULATORY_CARE_PROVIDER_SITE_OTHER): Payer: Medicare Other | Admitting: *Deleted

## 2022-10-08 DIAGNOSIS — J309 Allergic rhinitis, unspecified: Secondary | ICD-10-CM | POA: Diagnosis not present

## 2022-10-15 ENCOUNTER — Other Ambulatory Visit: Payer: Self-pay

## 2022-10-15 ENCOUNTER — Encounter: Payer: Self-pay | Admitting: Family Medicine

## 2022-10-15 ENCOUNTER — Ambulatory Visit (INDEPENDENT_AMBULATORY_CARE_PROVIDER_SITE_OTHER): Payer: Medicare Other | Admitting: Family Medicine

## 2022-10-15 VITALS — BP 128/96 | HR 90 | Temp 98.4°F | Ht 68.11 in | Wt 174.8 lb

## 2022-10-15 DIAGNOSIS — R21 Rash and other nonspecific skin eruption: Secondary | ICD-10-CM | POA: Diagnosis not present

## 2022-10-15 DIAGNOSIS — H1010 Acute atopic conjunctivitis, unspecified eye: Secondary | ICD-10-CM

## 2022-10-15 DIAGNOSIS — L2089 Other atopic dermatitis: Secondary | ICD-10-CM | POA: Diagnosis not present

## 2022-10-15 DIAGNOSIS — T7800XD Anaphylactic reaction due to unspecified food, subsequent encounter: Secondary | ICD-10-CM

## 2022-10-15 DIAGNOSIS — H1013 Acute atopic conjunctivitis, bilateral: Secondary | ICD-10-CM

## 2022-10-15 DIAGNOSIS — J302 Other seasonal allergic rhinitis: Secondary | ICD-10-CM | POA: Diagnosis not present

## 2022-10-15 DIAGNOSIS — J4530 Mild persistent asthma, uncomplicated: Secondary | ICD-10-CM | POA: Diagnosis not present

## 2022-10-15 DIAGNOSIS — J309 Allergic rhinitis, unspecified: Secondary | ICD-10-CM

## 2022-10-15 MED ORDER — MOMETASONE FUROATE 0.1 % EX OINT: TOPICAL_OINTMENT | Freq: Every day | CUTANEOUS | 0 refills | Status: DC

## 2022-10-15 NOTE — Progress Notes (Signed)
[place] [address] Dept: [id number]  FOLLOW UP NOTE  Patient ID: Francisco Cisneros, male    DOB: [birth year]  Age: 37 y.o. MRN: [id number] Date of Office Visit: 10/15/2022  Assessment  Chief Complaint: Rash, Pruritus, and Pain (Pt states he has a rash that burns with pain on chest arms stomach and legs x1 weeks)  HPI Francisco Cisneros is a 37 year old male who presents to the clinic for an acute evaluation of a rash that began about 1 week ago. He was last seen in this clinic on 10/01/2022 by Dr. [name] for evaluation of asthma, allergic rhinitis on allergen immunotherapy, atopic dermatitis on Dupixent, and food allergies to peanuts, tree nuts, stovetop egg, shellfish, mollusks, and coconut. He is accompanied by his father who assists with history.   At today's visit, he reports that last Friday he was moving some brush and later on that day he began to develop itch and red areas on his right arm, right chest, and a small area on his right leg. He reports that these areas are pruritic and have worsened as he has continued to scratch. He denies recent illness, new medications, new personal care products, or insect stings. He does not think that anyone in the house has developed a similar rash. He has continued to wear some of the same clothing he had on while clearing the brush on Friday. He has tried [place] without relief of redness or itch. He is also experiencing red and itchy areas on his neck and bilateral antecubital fosse that are consistent with an atopic dermatitis flare. He continues a twice a day moisturizing routine, Eucrisa up to twice a day to red and itchy areas and uses desonide 0.05% ointment only as needed to stubborn red and itchy areas with relief of symptoms.   Asthma is reported as well controlled with no shortness of breath, cough or wheeze. He has not needed to use his albuterol since his last visit to this clinic.   Allergic rhinitis is reported as well  controlled at this time. He continues allergen immunotherapy once every 3 weeks with occasional pain at the injection site which resolves quickly.   His current medications are listed in the chart.   Of note, oral steroids and steroid cream offered during the visit which were declined by patient and his father.  After consultation with my supervising physician for the day, the patient's mother was contacted and mometasone was prescribed with mother verbalizing understanding.  She will start mometasone tomorrow and apply once a day for 7 days.  She reports that she will call the clinic with any worsening symptoms.   Drug Allergies:  Allergies  Allergen Reactions   Egg-Derived Products Anaphylaxis   Other Anaphylaxis    Allergic to ALL NUTS   Peanut Oil Anaphylaxis   Shellfish Allergy Anaphylaxis   Coconut (Cocos Nucifera) Rash   Haloperidol Other (See Comments)    Drowsy for days   Peanut-Containing Drug Products     Physical Exam: BP (!) 128/96   Pulse 90   Temp 98.4 F (36.9 C)   Ht 5' 8.11" (1.73 m)   Wt 174 lb 12.8 oz (79.3 kg)   SpO2 97%   BMI 26.49 kg/m    Physical Exam Vitals reviewed.  Constitutional:      Appearance: Normal appearance.  HENT:     Head: Normocephalic and atraumatic.     Right Ear: Tympanic membrane normal.     Left  Ear: Tympanic membrane normal.     Nose:     Comments: Bilateral nares slightly erythematous with clear nasal drainage noted.  Pharynx normal.  Ears normal.  Eyes normal.    Mouth/Throat:     Pharynx: Oropharynx is clear.  Eyes:     Conjunctiva/sclera: Conjunctivae normal.  Cardiovascular:     Rate and Rhythm: Normal rate and regular rhythm.     Heart sounds: Normal heart sounds. No murmur heard. Pulmonary:     Effort: Pulmonary effort is normal.     Breath sounds: Normal breath sounds.     Comments: Lungs clear to auscultation Musculoskeletal:     Cervical back: Normal range of motion and neck supple.  Skin:    General: Skin  is warm.     Comments: Rash noted on right forearm, right middle chest, and small area noted on right leg.  Open areas noted at each location.  Satellite looking redness noted on his chest.  Linear scratch marks noted to the side on the rash on his arm.  See picture attached  Neurological:     Mental Status: He is alert and oriented to person, place, and time.  Psychiatric:        Mood and Affect: Mood normal.        Behavior: Behavior normal.        Thought Content: Thought content normal.        Judgment: Judgment normal.     Assessment and Plan: 1. Rash and nonspecific skin eruption   2. Mild persistent asthma without complication   3. Other atopic dermatitis   4. Allergic rhinitis, unspecified seasonality, unspecified trigger   5. Seasonal and perennial allergic rhinoconjunctivitis   6. Anaphylactic reaction due to food, subsequent encounter     Meds ordered this encounter  Medications   mometasone (ELOCON) 0.1 % ointment    Sig: Apply topically daily.    Dispense:  45 g    Refill:  0    Patient Instructions  Rash Begin mometasone ointment once a day for the next 7 days Continue to take pictures of the rash to monitor for resolution or worsening If the rash worsens call the clinic.   Asthma Continue albuterol 2 puffs once every 4 hours as needed for cough or wheeze  Seasonal and perennial allergic rhinoconjunctivitis Continue allergen avoidance measures directed toward pollens, mold, dust mites, cat, and dog Continue allergen immunotherapy and have access to an epinephrine auto-injector set Continue an antihistamine once a day as needed for a runny nose or itch Continue Flonase 2 sprays in each nostril once a day as needed for a stuffy nose Consider saline nasal rinses as needed for nasal symptoms. Use this before any medicated nasal sprays for best result  Food allergy  Continue strict avoidance of peanuts, tree nuts, eggs, shellfish, mollusks (clam, scallop),  coconut for now.  Okay to eat baked egg items.  For mild symptoms you can take over the counter antihistamines such as Benadryl and monitor symptoms closely. If symptoms worsen or if you have severe symptoms including breathing issues, throat closure, significant swelling, whole body hives, severe diarrhea and vomiting, lightheadedness then inject epinephrine and seek immediate medical care afterward.  Allergic contact dermatitis Continue to avoid gold, carba mix, epoxy resin, potassium dichromate, p-tert-butylphenol formaldehyde resin (done by derm).  Atopic dermatitis Follow up with dermatology and their recommendations. Continue Dupixent injections every 2 weeks.  Continue proper skin care - use fragrance free and dye free shampoo  such as vanicream.  Moisturize the eyelid the best as possible.  May use desonide 0.05% ointment twice a day as needed for mild eczema flares - okay to use on the face, neck, groin area. Do not use more than 1 week at a time. Use Eucrisa (crisaborole) 2% ointment twice a day on mild rash flares on the face and body. This is a non-steroid ointment  Call the clinic if this treatment plan is not working well for you  Follow up in 2 months or sooner if needed.   Return in about 2 months (around 12/15/2022), or if symptoms worsen or fail to improve.    Thank you for the opportunity to care for this patient.  Please do not hesitate to contact me with questions.  Gareth Morgan, FNP [facility]

## 2022-10-15 NOTE — Patient Instructions (Addendum)
Rash Begin mometasone ointment once a day for the next 7 days Continue to take pictures of the rash to monitor for resolution or worsening If the rash worsens call the clinic.   Asthma Continue albuterol 2 puffs once every 4 hours as needed for cough or wheeze  Seasonal and perennial allergic rhinoconjunctivitis Continue allergen avoidance measures directed toward pollens, mold, dust mites, cat, and dog Continue allergen immunotherapy and have access to an epinephrine auto-injector set Continue an antihistamine once a day as needed for a runny nose or itch Continue Flonase 2 sprays in each nostril once a day as needed for a stuffy nose Consider saline nasal rinses as needed for nasal symptoms. Use this before any medicated nasal sprays for best result  Food allergy  Continue strict avoidance of peanuts, tree nuts, eggs, shellfish, mollusks (clam, scallop), coconut for now.  Okay to eat baked egg items.  For mild symptoms you can take over the counter antihistamines such as Benadryl and monitor symptoms closely. If symptoms worsen or if you have severe symptoms including breathing issues, throat closure, significant swelling, whole body hives, severe diarrhea and vomiting, lightheadedness then inject epinephrine and seek immediate medical care afterward.  Allergic contact dermatitis Continue to avoid gold, carba mix, epoxy resin, potassium dichromate, p-tert-butylphenol formaldehyde resin (done by derm).  Atopic dermatitis Follow up with dermatology and their recommendations. Continue Dupixent injections every 2 weeks.  Continue proper skin care - use fragrance free and dye free shampoo such as vanicream.  Moisturize the eyelid the best as possible.  May use desonide 0.05% ointment twice a day as needed for mild eczema flares - okay to use on the face, neck, groin area. Do not use more than 1 week at a time. Use Eucrisa (crisaborole) 2% ointment twice a day on mild rash flares on the face  and body. This is a non-steroid ointment  Call the clinic if this treatment plan is not working well for you  Follow up in 2 months or sooner if needed.

## 2022-10-22 ENCOUNTER — Ambulatory Visit (INDEPENDENT_AMBULATORY_CARE_PROVIDER_SITE_OTHER): Payer: Medicare Other | Admitting: *Deleted

## 2022-10-22 DIAGNOSIS — J309 Allergic rhinitis, unspecified: Secondary | ICD-10-CM

## 2022-10-31 DIAGNOSIS — F419 Anxiety disorder, unspecified: Secondary | ICD-10-CM | POA: Diagnosis not present

## 2022-11-05 ENCOUNTER — Ambulatory Visit (INDEPENDENT_AMBULATORY_CARE_PROVIDER_SITE_OTHER): Payer: Medicare Other | Admitting: *Deleted

## 2022-11-05 DIAGNOSIS — J309 Allergic rhinitis, unspecified: Secondary | ICD-10-CM | POA: Diagnosis not present

## 2022-11-13 DIAGNOSIS — L209 Atopic dermatitis, unspecified: Secondary | ICD-10-CM | POA: Diagnosis not present

## 2022-11-13 DIAGNOSIS — Z5181 Encounter for therapeutic drug level monitoring: Secondary | ICD-10-CM | POA: Diagnosis not present

## 2022-11-13 NOTE — Progress Notes (Signed)
VIALS EXP 11-13-23 

## 2022-11-14 DIAGNOSIS — J301 Allergic rhinitis due to pollen: Secondary | ICD-10-CM | POA: Diagnosis not present

## 2022-11-15 DIAGNOSIS — J3089 Other allergic rhinitis: Secondary | ICD-10-CM | POA: Diagnosis not present

## 2022-11-19 ENCOUNTER — Ambulatory Visit (INDEPENDENT_AMBULATORY_CARE_PROVIDER_SITE_OTHER): Payer: Medicare Other

## 2022-11-19 DIAGNOSIS — J309 Allergic rhinitis, unspecified: Secondary | ICD-10-CM

## 2022-12-03 ENCOUNTER — Ambulatory Visit (INDEPENDENT_AMBULATORY_CARE_PROVIDER_SITE_OTHER): Payer: Medicare Other

## 2022-12-03 DIAGNOSIS — J309 Allergic rhinitis, unspecified: Secondary | ICD-10-CM | POA: Diagnosis not present

## 2022-12-14 ENCOUNTER — Encounter: Payer: Self-pay | Admitting: Internal Medicine

## 2022-12-14 ENCOUNTER — Other Ambulatory Visit: Payer: Self-pay

## 2022-12-14 ENCOUNTER — Ambulatory Visit (INDEPENDENT_AMBULATORY_CARE_PROVIDER_SITE_OTHER): Payer: Medicare Other | Admitting: Internal Medicine

## 2022-12-14 VITALS — BP 118/62 | HR 116 | Temp 98.7°F | Ht 68.5 in

## 2022-12-14 DIAGNOSIS — J4530 Mild persistent asthma, uncomplicated: Secondary | ICD-10-CM

## 2022-12-14 DIAGNOSIS — J302 Other seasonal allergic rhinitis: Secondary | ICD-10-CM | POA: Diagnosis not present

## 2022-12-14 DIAGNOSIS — J3089 Other allergic rhinitis: Secondary | ICD-10-CM | POA: Diagnosis not present

## 2022-12-14 MED ORDER — FLUTICASONE PROPIONATE 50 MCG/ACT NA SUSP: 2.0000 | Freq: Every day | NASAL | 5 refills | Status: AC

## 2022-12-14 MED ORDER — LEVOCETIRIZINE DIHYDROCHLORIDE 5 MG PO TABS: 5.0000 mg | ORAL_TABLET | Freq: Every evening | ORAL | 5 refills | Status: AC

## 2022-12-14 MED ORDER — AZELASTINE HCL 0.1 % NA SOLN: 1.0000 | Freq: Two times a day (BID) | NASAL | 5 refills | Status: DC | PRN

## 2022-12-14 NOTE — Patient Instructions (Addendum)
Seasonal and perennial allergic rhinoconjunctivitis - Continue allergen avoidance measures directed toward pollens, mold, dust mites, cat, and dog - Continue allergen immunotherapy and have access to an epinephrine auto-injector set. Okay to skip once a week, if needed. - Use nasal saline spray.   - Use Flonase 2 sprays each nostril daily. Aim upward and outward. - Use Azelastine 1-2 sprays each nostril twice daily as needed for runny nose, drainage, congestion, sneezing. Aim upward and outward. - Use Zyrtec 10 mg daily or Xyzal 5mg  daily.   - Okay to use Mucinex three times a day for about 3-5 days.   Asthma Continue albuterol 2 puffs once every 4 hours as needed for cough or wheeze  Food allergy  Continue strict avoidance of peanuts, tree nuts, eggs, shellfish, mollusks (clam, scallop), coconut for now.  Okay to eat baked egg items.  For mild symptoms you can take over the counter antihistamines such as Benadryl and monitor symptoms closely. If symptoms worsen or if you have severe symptoms including breathing issues, throat closure, significant swelling, whole body hives, severe diarrhea and vomiting, lightheadedness then inject epinephrine and seek immediate medical care afterward.  Allergic contact dermatitis Continue to avoid gold, carba mix, epoxy resin, potassium dichromate, p-tert-butylphenol formaldehyde resin (done by derm).  Atopic dermatitis Follow up with dermatology and their recommendations. Continue Dupixent injections every 2 weeks.  Continue proper skin care - use fragrance free and dye free shampoo such as vanicream.  Moisturize the eyelid the best as possible.  May use desonide 0.05% ointment twice a day as needed for mild eczema flares - okay to use on the face, neck, groin area. Do not use more than 1 week at a time. Use Eucrisa (crisaborole) 2% ointment twice a day on mild rash flares on the face and body. This is a non-steroid ointment  Keep follow up in August.

## 2022-12-14 NOTE — Progress Notes (Signed)
FOLLOW UP Date of Service/Encounter:  12/14/22   Subjective:  Francisco Cisneros (DOB: 1985-10-04) is a 37 y.o. male who [facility] for follow up for an acute visit.   History obtained from: chart review and patient, mother, and father. Last saw Thermon Leyland 10/15/2022 for rash- started on mometasone ointment; asthma - PRN albuterol; allergic rhino conjunctivitis- on AIT, Flonase, OAH; food allergies- avoiding peanut, treenut, shellfish, stovetop eggs, coconut and eczema-Dupixent/desonide/Eucrisa.  Reports that for the past few days, he has had a lot of congestion, runny nose, posterior drainage and wet cough.  No sick contacts. No fevers.  Has mucinex but not started it.  Using zyrtec daily. Has Flonase and just started it yesterday.  He is on AIT and is doing well with it; this has helped tremendously with his allergies but still seems to flare up around this time of the year. He also reports a lot of anxiety due to this cough, not much issues with SOB or wheezing though. Has not used the albuterol inhaler.  Reports asthma overall has done well in the past.    Past Medical History: Past Medical History:  Diagnosis Date   ADHD (attention deficit hyperactivity disorder)    Asthma    Autistic disorder    Eczema     Objective:  BP 118/62   Pulse (!) 116   Temp 98.7 F (37.1 C) (Temporal)   Ht 5' 8.5" (1.74 m)   SpO2 96%   BMI 26.19 kg/m  Body mass index is 26.19 kg/m. Physical Exam: GEN: alert, well developed HEENT: clear conjunctiva, nose with moderate inferior turbinate hypertrophy, boggy nasal mucosa, clear rhinorrhea, slight cobblestoning HEART: regular rate and rhythm, no murmur LUNGS: clear to auscultation bilaterally, no coughing, unlabored respiration SKIN: no rashes or lesions  Spirometry:  Tracings reviewed. His effort: Good reproducible efforts. FVC: 4.03L FEV1: 3.2L, 78% predicted FEV1/FVC ratio: 79% Interpretation:  Spirometry consistent with normal pattern.  Please see scanned spirometry results for details.  Assessment:   1. Seasonal and perennial allergic rhinitis   2. Mild persistent asthma without complication     Plan/Recommendations:  Seasonal and perennial allergic rhinoconjunctivitis - Discussed likely related to uncontrolled allergic rhinitis, could be a viral infection but less likely based on the season. Will start daily INCS + INAH and can stop or use PRN once he is improved.  - Continue allergen avoidance measures directed toward pollens, mold, dust mites, cat, and dog - Continue allergen immunotherapy and have access to an epinephrine auto-injector set. Okay to skip once a week, if needed. - Use nasal saline spray.   - Use Flonase 2 sprays each nostril daily. Aim upward and outward. - Use Azelastine 1-2 sprays each nostril twice daily as needed for runny nose, drainage, congestion, sneezing. Aim upward and outward. - Use Zyrtec 10 mg daily or Xyzal 5mg  daily.   - Okay to use Mucinex three times a day for about 3-5 days.   Mild Persistent Asthma - Normal spirometry today. No wheezing/SOB. Normal lung exam. Low suspicion for asthma exacerbation.  Continue albuterol 2 puffs once every 4 hours as needed for cough or wheeze  Food allergy  Continue strict avoidance of peanuts, tree nuts, eggs, shellfish, mollusks (clam, scallop), coconut for now.  Okay to eat baked egg items.  For mild symptoms you can take over the counter antihistamines such as Benadryl and monitor symptoms closely. If symptoms worsen or if you have severe symptoms including  breathing issues, throat closure, significant swelling, whole body hives, severe diarrhea and vomiting, lightheadedness then inject epinephrine and seek immediate medical care afterward.  Allergic contact dermatitis Continue to avoid gold, carba mix, epoxy resin, potassium dichromate, p-tert-butylphenol formaldehyde resin (done by derm).  Atopic  dermatitis Follow up with dermatology and their recommendations. Continue Dupixent injections every 2 weeks.  Continue proper skin care - use fragrance free and dye free shampoo such as vanicream.  Moisturize the eyelid the best as possible.  May use desonide 0.05% ointment twice a day as needed for mild eczema flares - okay to use on the face, neck, groin area. Do not use more than 1 week at a time. Use Eucrisa (crisaborole) 2% ointment twice a day on mild rash flares on the face and body. This is a non-steroid ointment  Keep follow up in August.      No follow-ups on file.  Alesia Morin, MD [facility]

## 2022-12-17 ENCOUNTER — Ambulatory Visit (INDEPENDENT_AMBULATORY_CARE_PROVIDER_SITE_OTHER): Payer: Medicare Other | Admitting: *Deleted

## 2022-12-17 DIAGNOSIS — J309 Allergic rhinitis, unspecified: Secondary | ICD-10-CM | POA: Diagnosis not present

## 2023-01-01 ENCOUNTER — Ambulatory Visit (INDEPENDENT_AMBULATORY_CARE_PROVIDER_SITE_OTHER): Payer: Medicare Other | Admitting: *Deleted

## 2023-01-01 DIAGNOSIS — J309 Allergic rhinitis, unspecified: Secondary | ICD-10-CM | POA: Diagnosis not present

## 2023-01-14 ENCOUNTER — Ambulatory Visit: Payer: Self-pay | Admitting: *Deleted

## 2023-01-22 DIAGNOSIS — Z03818 Encounter for observation for suspected exposure to other biological agents ruled out: Secondary | ICD-10-CM | POA: Diagnosis not present

## 2023-01-22 DIAGNOSIS — J329 Chronic sinusitis, unspecified: Secondary | ICD-10-CM | POA: Diagnosis not present

## 2023-01-22 DIAGNOSIS — R5383 Other fatigue: Secondary | ICD-10-CM | POA: Diagnosis not present

## 2023-01-22 DIAGNOSIS — R0981 Nasal congestion: Secondary | ICD-10-CM | POA: Diagnosis not present

## 2023-01-22 DIAGNOSIS — H66002 Acute suppurative otitis media without spontaneous rupture of ear drum, left ear: Secondary | ICD-10-CM | POA: Diagnosis not present

## 2023-01-24 DIAGNOSIS — F419 Anxiety disorder, unspecified: Secondary | ICD-10-CM | POA: Diagnosis not present

## 2023-02-04 ENCOUNTER — Ambulatory Visit (INDEPENDENT_AMBULATORY_CARE_PROVIDER_SITE_OTHER): Payer: Medicare Other

## 2023-02-04 DIAGNOSIS — J309 Allergic rhinitis, unspecified: Secondary | ICD-10-CM | POA: Diagnosis not present

## 2023-02-11 ENCOUNTER — Ambulatory Visit (INDEPENDENT_AMBULATORY_CARE_PROVIDER_SITE_OTHER): Payer: Medicare Other

## 2023-02-11 DIAGNOSIS — J309 Allergic rhinitis, unspecified: Secondary | ICD-10-CM

## 2023-02-12 DIAGNOSIS — F411 Generalized anxiety disorder: Secondary | ICD-10-CM | POA: Diagnosis not present

## 2023-02-12 DIAGNOSIS — L2084 Intrinsic (allergic) eczema: Secondary | ICD-10-CM | POA: Diagnosis not present

## 2023-02-12 DIAGNOSIS — D582 Other hemoglobinopathies: Secondary | ICD-10-CM | POA: Diagnosis not present

## 2023-02-12 DIAGNOSIS — J4520 Mild intermittent asthma, uncomplicated: Secondary | ICD-10-CM | POA: Diagnosis not present

## 2023-02-12 DIAGNOSIS — E785 Hyperlipidemia, unspecified: Secondary | ICD-10-CM | POA: Diagnosis not present

## 2023-02-18 ENCOUNTER — Ambulatory Visit (INDEPENDENT_AMBULATORY_CARE_PROVIDER_SITE_OTHER): Payer: Medicare Other | Admitting: *Deleted

## 2023-02-18 DIAGNOSIS — J309 Allergic rhinitis, unspecified: Secondary | ICD-10-CM

## 2023-03-11 ENCOUNTER — Ambulatory Visit (INDEPENDENT_AMBULATORY_CARE_PROVIDER_SITE_OTHER): Payer: Self-pay

## 2023-03-11 DIAGNOSIS — J309 Allergic rhinitis, unspecified: Secondary | ICD-10-CM | POA: Diagnosis not present

## 2023-03-18 ENCOUNTER — Ambulatory Visit (INDEPENDENT_AMBULATORY_CARE_PROVIDER_SITE_OTHER): Payer: Medicare Other

## 2023-03-18 DIAGNOSIS — J309 Allergic rhinitis, unspecified: Secondary | ICD-10-CM | POA: Diagnosis not present

## 2023-03-24 NOTE — Progress Notes (Unsigned)
Follow Up Note  RE: Francisco Cisneros MRN: [id number] DOB: 1986-06-18 Date of Office Visit: 03/25/2023  Referring provider: Farris Has, MD Primary care provider: Farris Has, MD  Chief Complaint: No chief complaint on file.  History of Present Illness: I had the pleasure of seeing Francisco Cisneros for a follow up visit at the [facility] on 03/24/2023. He is a 37 y.o. male, who is being followed for allergic rhinoconjunctivitis on AIT, asthma, food allergy, atopic dermatitis on Dupixent. His previous allergy office visit was on 12/14/2022 with Dr. Allena Katz. Today is a regular follow up visit.  Seasonal and perennial allergic rhinoconjunctivitis - Discussed likely related to uncontrolled allergic rhinitis, could be a viral infection but less likely based on the season. Will start daily INCS + INAH and can stop or use PRN once he is improved.  - Continue allergen avoidance measures directed toward pollens, mold, dust mites, cat, and dog - Continue allergen immunotherapy and have access to an epinephrine auto-injector set. Okay to skip once a week, if needed. - Use nasal saline spray.   - Use Flonase 2 sprays each nostril daily. Aim upward and outward. - Use Azelastine 1-2 sprays each nostril twice daily as needed for runny nose, drainage, congestion, sneezing. Aim upward and outward. - Use Zyrtec 10 mg daily or Xyzal 5mg  daily.   - Okay to use Mucinex three times a day for about 3-5 days.    Mild Persistent Asthma - Normal spirometry today. No wheezing/SOB. Normal lung exam. Low suspicion for asthma exacerbation.  Continue albuterol 2 puffs once every 4 hours as needed for cough or wheeze   Food allergy  Continue strict avoidance of peanuts, tree nuts, eggs, shellfish, mollusks (clam, scallop), coconut for now.  Okay to eat baked egg items.  For mild symptoms you can take over the counter antihistamines such as Benadryl and monitor symptoms closely. If symptoms worsen  or if you have severe symptoms including breathing issues, throat closure, significant swelling, whole body hives, severe diarrhea and vomiting, lightheadedness then inject epinephrine and seek immediate medical care afterward.   Allergic contact dermatitis Continue to avoid gold, carba mix, epoxy resin, potassium dichromate, p-tert-butylphenol formaldehyde resin (done by derm).   Atopic dermatitis Follow up with dermatology and their recommendations. Continue Dupixent injections every 2 weeks.  Continue proper skin care - use fragrance free and dye free shampoo such as vanicream.  Moisturize the eyelid the best as possible.  May use desonide 0.05% ointment twice a day as needed for mild eczema flares - okay to use on the face, neck, groin area. Do not use more than 1 week at a time. Use Eucrisa (crisaborole) 2% ointment twice a day on mild rash flares on the face and body. This is a non-steroid ointment  Assessment and Plan: Francisco Cisneros is a 37 y.o. male with: ***  No follow-ups on file.  No orders of the defined types were placed in this encounter.  Lab Orders  No laboratory test(s) ordered today    Diagnostics: Spirometry:  Tracings reviewed. His effort: {Blank single:19197::"Good reproducible efforts.","It was hard to get consistent efforts and there is a question as to whether this reflects a maximal maneuver.","Poor effort, data can not be interpreted."} FVC: ***L FEV1: ***L, ***% predicted FEV1/FVC ratio: ***% Interpretation: {Blank single:19197::"Spirometry consistent with mild obstructive disease","Spirometry consistent with moderate obstructive disease","Spirometry consistent with severe obstructive disease","Spirometry consistent with possible restrictive disease","Spirometry consistent with mixed obstructive and restrictive disease","Spirometry uninterpretable due to [employer]  consistent with normal pattern","No overt abnormalities noted given today's efforts"}.   Please see scanned spirometry results for details.  Skin Testing: {Blank single:19197::"Select foods","Environmental allergy panel","Environmental allergy panel and select foods","Food allergy panel","None","Deferred due to recent antihistamines use"}. *** Results discussed with patient/family.   Medication List:  Current Outpatient Medications  Medication Sig Dispense Refill  . acetaminophen (TYLENOL) 500 MG tablet Take 1,000 mg by mouth every 6 (six) hours as needed for moderate pain.    Marland Kitchen albuterol (PROVENTIL) (2.5 MG/3ML) 0.083% nebulizer solution Take 2.5 mg by nebulization every 6 (six) hours as needed for wheezing or shortness of breath.    Marland Kitchen albuterol (VENTOLIN HFA) 108 (90 Base) MCG/ACT inhaler USE 2 PUFFS BY MOUTH EVERY FOUR HOURS AS NEEDED FOR WHEEZING OR SHORTNESS OF BREATH 18 g 1  . azelastine (ASTELIN) 0.1 % nasal spray Place 1 spray into both nostrils 2 (two) times daily as needed. Use in each nostril as directed 30 mL 5  . Ciclopirox 1 % shampoo Apply 1 Bottle topically at bedtime.    . clindamycin (CLINDAGEL) 1 % gel Apply topically 2 (two) times daily.    Marland Kitchen desonide (DESOWEN) 0.05 % ointment Apply sparingly to affected areas twice daily as needed to the face and/or neck. 15 g 5  . dexmethylphenidate (FOCALIN XR) 20 MG 24 hr capsule Take 20 mg by mouth every morning.    Marland Kitchen dexmethylphenidate (FOCALIN) 10 MG tablet Take 10 mg by mouth daily.    Marland Kitchen docusate sodium (COLACE) 100 MG capsule Take 100 mg by mouth daily as needed for mild constipation.    . DUPIXENT 300 MG/2ML SOPN Inject into the skin.    Marland Kitchen EPINEPHrine 0.3 mg/0.3 mL IJ SOAJ injection Inject 0.3 mg into the muscle daily as needed (allergic reaction). 1 each 2  . EUCRISA 2 % OINT Apply topically 2 (two) times daily.    . fluticasone (FLONASE) 50 MCG/ACT nasal spray Place 2 sprays into both nostrils daily. 16 g 5  . fluticasone (FLOVENT HFA) 44 MCG/ACT inhaler Inhale 2 puffs into the lungs 2 (two) times daily. (Patient  not taking: Reported on 12/14/2022) 1 each 12  . ketoconazole (NIZORAL) 2 % cream Apply topically daily as needed (athlete's foot). 60 g 1  . Lactobacillus-Inulin (CULTURELLE DIGESTIVE HEALTH PO) Take 1 capsule by mouth daily.    Marland Kitchen levocetirizine (XYZAL) 5 MG tablet Take 1 tablet (5 mg total) by mouth every evening. 30 tablet 5  . LORazepam (ATIVAN) 0.5 MG tablet Take 0.5 mg by mouth daily as needed for anxiety.    . mometasone (ELOCON) 0.1 % ointment Apply topically daily. (Patient not taking: Reported on 12/14/2022) 45 g 0  . mupirocin ointment (BACTROBAN) 2 % SMARTSIG:1 Application Topical 2-3 Times Daily (Patient not taking: Reported on 12/14/2022)    . Omega-3 Fatty Acids (FISH OIL) 645 MG CAPS Take 640 mg by mouth 3 (three) times daily.     Bertram Gala Glycol-Propyl Glycol (SYSTANE ULTRA OP) Apply 1 drop to eye daily as needed.    . sertraline (ZOLOFT) 50 MG tablet Take 50 mg by mouth daily.    Marland Kitchen Spacer/Aero-Holding Chambers (AEROCHAMBER PLUS FLO-VU LARGE) MISC     . Spacer/Aero-Holding Rudean Curt Use with MDI inhaler 1 each 0  . terbinafine (LAMISIL) 1 % cream Apply 1 application  topically 2 (two) times daily.    Marland Kitchen triamcinolone ointment (KENALOG) 0.1 % Apply 1 Application topically 2 (two) times daily as needed (rash flare). Do not use on  the face, neck, armpits or groin area. Do not use more than 3 weeks in a row. 30 g 1   No current facility-administered medications for this visit.   Allergies: Allergies  Allergen Reactions  . Egg-Derived Products Anaphylaxis  . Other Anaphylaxis    Allergic to ALL NUTS  . Peanut Oil Anaphylaxis  . Shellfish Allergy Anaphylaxis  . Coconut (Cocos Nucifera) Rash  . Haloperidol Other (See Comments)    Drowsy for days  . Peanut-Containing Drug Products    I reviewed his past medical history, social history, family history, and environmental history and no significant changes have been reported from his previous visit.  Review of Systems   Constitutional:  Negative for appetite change, chills, fever and unexpected weight change.  HENT:  Negative for congestion and rhinorrhea.   Eyes:  Negative for itching.  Respiratory:  Negative for cough, chest tightness, shortness of breath and wheezing.   Gastrointestinal:  Negative for abdominal pain.  Skin:  Positive for rash.  Allergic/Immunologic: Positive for environmental allergies and food allergies.   Objective: There were no vitals taken for this visit. There is no height or weight on file to calculate BMI. Physical Exam Vitals and nursing note reviewed.  Constitutional:      Appearance: He is well-developed.  HENT:     Head: Normocephalic and atraumatic.     Right Ear: Tympanic membrane and external ear normal.     Left Ear: Tympanic membrane and external ear normal.     Nose: Nose normal.     Mouth/Throat:     Mouth: Mucous membranes are moist.     Pharynx: Oropharynx is clear. No oropharyngeal exudate or posterior oropharyngeal erythema.  Eyes:     Conjunctiva/sclera: Conjunctivae normal.  Cardiovascular:     Rate and Rhythm: Normal rate and regular rhythm.     Heart sounds: Normal heart sounds. No murmur heard. Pulmonary:     Effort: Pulmonary effort is normal.     Breath sounds: Normal breath sounds. No wheezing, rhonchi or rales.  Musculoskeletal:     Cervical back: Neck supple.  Skin:    General: Skin is warm.     Findings: Rash present.     Comments: Few scattered erythematous patches on the upper extremities b/l.  Neurological:     Mental Status: He is alert. Mental status is at baseline.  Previous notes and tests were reviewed. The plan was reviewed with the patient/family, and all questions/concerned were addressed.  It was my pleasure to see Francisco Cisneros today and participate in his care. Please feel free to contact me with any questions or concerns.  Sincerely,  Wyline Mood, DO Allergy & Immunology  [facility] office: [phone] [facility] office: [phone]

## 2023-03-25 ENCOUNTER — Encounter: Payer: Self-pay | Admitting: Allergy

## 2023-03-25 ENCOUNTER — Ambulatory Visit: Payer: Medicare Other | Admitting: Allergy

## 2023-03-25 VITALS — BP 116/68 | HR 95 | Temp 98.1°F

## 2023-03-25 DIAGNOSIS — T7800XD Anaphylactic reaction due to unspecified food, subsequent encounter: Secondary | ICD-10-CM

## 2023-03-25 DIAGNOSIS — J301 Allergic rhinitis due to pollen: Secondary | ICD-10-CM | POA: Diagnosis not present

## 2023-03-25 DIAGNOSIS — H1013 Acute atopic conjunctivitis, bilateral: Secondary | ICD-10-CM

## 2023-03-25 DIAGNOSIS — L2389 Allergic contact dermatitis due to other agents: Secondary | ICD-10-CM | POA: Diagnosis not present

## 2023-03-25 DIAGNOSIS — L2089 Other atopic dermatitis: Secondary | ICD-10-CM

## 2023-03-25 DIAGNOSIS — J3089 Other allergic rhinitis: Secondary | ICD-10-CM

## 2023-03-25 DIAGNOSIS — J4530 Mild persistent asthma, uncomplicated: Secondary | ICD-10-CM | POA: Diagnosis not present

## 2023-03-25 DIAGNOSIS — J3081 Allergic rhinitis due to animal (cat) (dog) hair and dander: Secondary | ICD-10-CM

## 2023-03-25 MED ORDER — EUCRISA 2 % EX OINT: 1.0000 | TOPICAL_OINTMENT | Freq: Two times a day (BID) | CUTANEOUS | 5 refills | Status: DC | PRN

## 2023-03-25 NOTE — Patient Instructions (Addendum)
Seasonal and perennial allergic rhinoconjunctivitis 2022 bloodwork positive to dust mites, mold, tree pollen and ragweed pollen.  Borderline positive to weed pollen, grass pollen, cockroach, cat and dog.  Continue environmental control measures.  Use over the counter antihistamines such as Zyrtec (cetirizine), Claritin (loratadine), Allegra (fexofenadine), or Xyzal (levocetirizine) daily as needed. May take twice a day during allergy flares. May switch antihistamines every few months. Use Flonase (fluticasone) nasal spray 1 spray per nostril 1-2 times a day as needed for nasal congestion.  Nasal saline spray (i.e., Simply Saline) is recommended as needed especially after being outdoors for a prolonged time.  Continue allergy injections - given today.    Food allergy  Continue strict avoidance of peanuts, tree nuts, eggs, shellfish, mollusks (clam, scallop). Okay to eat baked egg items. Okay to try coconut. For mild symptoms you can take over the counter antihistamines such as Benadryl 1-2 tablets = 25-50mg  and monitor symptoms closely.  If symptoms worsen or if you have severe symptoms including breathing issues, throat closure, significant swelling, whole body hives, severe diarrhea and vomiting, lightheadedness then seek immediate medical care.  Consider food challenges in the winter months in 2025 or 2026.  Food challenges to consider: almond milk, shellfish, peanuts, stove top eggs.    Atopic dermatitis Follow up with dermatology and their recommendations. 2023 patch testing positive to gold, carba mix, epoxy resin, potassium dichromate, p-tert-butylphenol formaldehyde resin (done by derm).  Avoid items that were positive on patch testing Continue Dupixent injections every 2 weeks.  Continue proper skin care - use fragrance free and dye free shampoo such as vanicream.  May use desonide 0.05% ointment twice a day as needed for mild eczema flares - okay to use on the face, neck, groin area.  Do not use more than 1 week at a time. Use Eucrisa (crisaborole) 2% ointment twice a day on mild rash flares on the face and body. This is a non-steroid ointment  Asthma  Daily controller medication(s): none.  During respiratory infections/flares:  Start Flovent 2 puffs twice a day with spacer and rinse mouth afterwards for 1-2 weeks until your breathing symptoms return to baseline.  Pretreat with albuterol 2 puffs or albuterol nebulizer.  If you need to use your albuterol nebulizer machine back to back within 15-30 minutes with no relief then please go to the ER/urgent care for further evaluation.  May use albuterol rescue inhaler 2 puffs or nebulizer every 4 to 6 hours as needed for shortness of breath, chest tightness, coughing, and wheezing. May use albuterol rescue inhaler 2 puffs 5 to 15 minutes prior to strenuous physical activities. Monitor frequency of use - if you need to use it more than twice per week on a consistent basis let us know.  Asthma control goals:  Full participation in all desired activities (may need albuterol before activity) Albuterol use two times or less a week on average (not counting use with activity) Cough interfering with sleep two times or less a month Oral steroids no more than once a year No hospitalizations   Follow up in 6 months or sooner if needed.

## 2023-04-09 ENCOUNTER — Ambulatory Visit (INDEPENDENT_AMBULATORY_CARE_PROVIDER_SITE_OTHER): Payer: Medicare Other | Admitting: *Deleted

## 2023-04-09 DIAGNOSIS — J309 Allergic rhinitis, unspecified: Secondary | ICD-10-CM | POA: Diagnosis not present

## 2023-04-15 ENCOUNTER — Ambulatory Visit (INDEPENDENT_AMBULATORY_CARE_PROVIDER_SITE_OTHER): Payer: Self-pay

## 2023-04-15 DIAGNOSIS — J309 Allergic rhinitis, unspecified: Secondary | ICD-10-CM | POA: Diagnosis not present

## 2023-04-24 DIAGNOSIS — F419 Anxiety disorder, unspecified: Secondary | ICD-10-CM | POA: Diagnosis not present

## 2023-05-06 ENCOUNTER — Ambulatory Visit (INDEPENDENT_AMBULATORY_CARE_PROVIDER_SITE_OTHER): Payer: Medicare Other

## 2023-05-06 DIAGNOSIS — J309 Allergic rhinitis, unspecified: Secondary | ICD-10-CM

## 2023-05-14 DIAGNOSIS — Z5181 Encounter for therapeutic drug level monitoring: Secondary | ICD-10-CM | POA: Diagnosis not present

## 2023-05-14 DIAGNOSIS — L209 Atopic dermatitis, unspecified: Secondary | ICD-10-CM | POA: Diagnosis not present

## 2023-05-22 ENCOUNTER — Ambulatory Visit (INDEPENDENT_AMBULATORY_CARE_PROVIDER_SITE_OTHER): Payer: Medicare Other | Admitting: Podiatry

## 2023-05-22 ENCOUNTER — Ambulatory Visit (INDEPENDENT_AMBULATORY_CARE_PROVIDER_SITE_OTHER): Payer: Medicare Other

## 2023-05-22 DIAGNOSIS — M779 Enthesopathy, unspecified: Secondary | ICD-10-CM | POA: Diagnosis not present

## 2023-05-22 DIAGNOSIS — L600 Ingrowing nail: Secondary | ICD-10-CM | POA: Diagnosis not present

## 2023-05-22 DIAGNOSIS — M2042 Other hammer toe(s) (acquired), left foot: Secondary | ICD-10-CM | POA: Diagnosis not present

## 2023-05-22 NOTE — Progress Notes (Signed)
Subjective:   Patient ID: Francisco Cisneros, male   DOB: 37 y.o.   MRN: [id number]   HPI Patient presents with his parents with multiple problems and complaints but difficult to get complete answer from him due to mental acuity.  Patient complains of ingrown toenail hammertoe deformity and generalized foot pain patient does not smoke and is very active   Review of Systems  All other systems reviewed and are negative.       Objective:  Physical Exam Vitals and nursing note reviewed.  Constitutional:      Appearance: He is well-developed.  Pulmonary:     Effort: Pulmonary effort is normal.  Musculoskeletal:        General: Normal range of motion.  Skin:    General: Skin is warm.  Neurological:     Mental Status: He is alert.     Neurovascular status was found to be intact muscle strength adequate range of motion adequate digital deformity noted digit 3 and digit to left away the interface moderate depression of the arch noted bilateral with family history of flatfeet and incurvated medial borders left hallux mild tenderness and swelling     Assessment:  Several different problems with inflammation and also digital deformity and ingrown toenail     Plan:  H&P x-rays reviewed discussed different treatment options and went to try to be conservative.  May eventually need digital fusion but at this point I went ahead and I am going to focus on his foot structure and we casted for functional orthotics by physician and will return when ready.  Discussed correction of ingrown toenail do not recommend currently but may need to be done

## 2023-05-27 ENCOUNTER — Ambulatory Visit (INDEPENDENT_AMBULATORY_CARE_PROVIDER_SITE_OTHER): Payer: Self-pay

## 2023-05-27 DIAGNOSIS — J309 Allergic rhinitis, unspecified: Secondary | ICD-10-CM

## 2023-05-28 ENCOUNTER — Ambulatory Visit: Payer: Medicare Other | Attending: Psychiatry | Admitting: Speech Pathology

## 2023-05-28 DIAGNOSIS — F849 Pervasive developmental disorder, unspecified: Secondary | ICD-10-CM | POA: Diagnosis not present

## 2023-05-28 DIAGNOSIS — R4789 Other speech disturbances: Secondary | ICD-10-CM | POA: Insufficient documentation

## 2023-05-28 DIAGNOSIS — F8089 Other developmental disorders of speech and language: Secondary | ICD-10-CM | POA: Diagnosis not present

## 2023-05-29 NOTE — Therapy (Signed)
OUTPATIENT SPEECH LANGUAGE PATHOLOGY   EVALUATION   Patient Name: Francisco Cisneros MRN: [id number] DOB:1985/10/08, 37 y.o., male Today's Date: 05/29/2023  PCP: Farris Has, MD REFERRING PROVIDER: Lynnell Grain, MD   End of Session - 05/28/23 1553     Visit Number 1    Number of Visits 17    Date for SLP Re-Evaluation 07/23/23    Authorization Type Medicare A/ Medicare Part B    Progress Note Due on Visit 10    SLP Start Time 1400    SLP Stop Time  1445    SLP Time Calculation (min) 45 min    Activity Tolerance Patient tolerated treatment well             No past medical history on file.  The histories are not reviewed yet. Please review them in the "History" navigator section and refresh this SmartLink. 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   Right bundle branch block 02/09/2022   Seasonal and perennial allergic rhinoconjunctivitis 07/10/2021   History of esophagogastroduodenoscopy (EGD) 06/18/2018   Anaphylactic reaction due to food, subsequent encounter 03/19/2017   Atopic dermatitis 03/19/2017   Mild persistent asthma without complication 03/19/2017    ONSET DATE: chronic developmental, date of referral  05/21/2023  REFERRING DIAG: 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 He mentions that he had speech therapy during academic time in [place]. 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   He has history of anxiety.    PAIN:  Are you having pain? No   FALLS: Has patient fallen in last 6 months?  No  LIVING ENVIRONMENT: Lives with: lives with their family and lives in an adult home Lives in: House/apartment  PLOF:  Level of assistance: Needed assistance with ADLs, Needed assistance with IADLS Employment: On disability   PATIENT GOALS      To improve my speaking ability  OBJECTIVE:  COGNITION: Overall cognitive status: History of cognitive impairments - at baseline Pt presents with great functional abilities to perform ADLs/iADLs, state his goals, play instruments, is involved in activities within his home [facility]) as well as community activities, is able to read and write  MOTOR SPEECH: Overall motor speech: Appears intact Respiration: diaphragmatic/abdominal breathing Phonation: normal Resonance: WFL Articulation:  fluctuates with rate Intelligibility: Intelligibility reduced Motor planning: Impaired: aware, unaware, and inconsistent Motor speech errors: aware, unaware, and inconsistent Interfering components: premorbid status Effective technique: slow rate  ORAL MOTOR EXAMINATION: Facial : WFL Lingual: WFL Velum: WFL Mandible: WFL Cough: WFL Voice: WFL    RESPIRATION: WNL  PHONATION:  Voice quality: normal  and low vocal intensity   RESONANCE: WNL    TODAY'S TREATMENT: N/A     PATIENT EDUCATION: Education details: results of this assessment, ST POC Person educated: Patient and  Parent Education method: Explanation Education comprehension: needs further education   HOME EXERCISE PROGRAM: Bring in his essay on his grandfather  GOALS: Goals reviewed with patient? Yes  SHORT TERM GOALS: Target date: 10 sessions  With Min A, pt will use speech intelligibility strategies to achieve > 95% intelligibility at the simple paragraph level.  Baseline: Moderate Goal status: INITIAL  2.  With Mod to Min A, the patient will produce multiple sentences placing pauses in appropriate places in 80% of opportunities.  Baseline:  Goal status: INITIAL  3.  With Mod to Min A, pt will participate in turn-taking with the therapist for 5 turns per opportunity with a minimum of 5 opportunities across 3 data collections. Baseline:  Goal status: INITIAL  4.  With Mod to Min A, pt will use age-appropriate vocal characteristics (intonation, volume) for 80% of conversational turns during therapy sessions independently for 3 data collections. Baseline:  Goal status: INITIAL   LONG TERM GOALS: Target date: 07/23/2023  With Min A, the patient will complete monologues at least 3 minutes long at 80% intelligibility. Baseline:  Goal status: INITIAL  2.  With supervision, patient will produce multiple sentences placing pauses in appropriate places in 80% of opportunities.  Baseline:  Goal status: INITIAL  3.  With supervision, pt will use age-appropriate vocal characteristics (intonation, volume) for 80% of conversational turns during therapy sessions independently for 3 data collections. Baseline:  Goal status: INITIAL   ASSESSMENT:  CLINICAL IMPRESSION: Patient is a 37 y.o. male who was seen today for an evaluation to assess his speech intelligibility, pragmatic skills in an effort to improve his ability to perform public speaking activities in support of his residence/community, [employer].   He presents with fluctuating periods of decreased intelligibility d/t  periods of fast rate of speech, difficulty turn taking, topic maintenance as well as variances in prosody and vocal intensity.   OBJECTIVE IMPAIRMENTS include attention, expressive language, and pragmatic/social communication . These impairments are limiting patient from effectively communicating at home and in community. Factors affecting potential to achieve goals and functional outcome are co-morbidities and previous level of function. Patient will benefit from skilled SLP services to address above impairments and improve overall function.  REHAB POTENTIAL: Good  PLAN: SLP FREQUENCY: 1-2x/week  SLP DURATION: 8 weeks  PLANNED INTERVENTIONS: Functional tasks, SLP instruction and feedback, Compensatory strategies, Patient/family education, and social communication    Jag Lenz B. Dreama Saa, M.S., CCC-SLP, Tree surgeon Certified Brain Injury Specialist [facility]  [facility] Office [phone] Ascom [phone] Fax [phone]

## 2023-05-30 ENCOUNTER — Ambulatory Visit: Payer: Medicare Other | Admitting: Speech Pathology

## 2023-05-30 DIAGNOSIS — F8089 Other developmental disorders of speech and language: Secondary | ICD-10-CM

## 2023-05-30 DIAGNOSIS — F849 Pervasive developmental disorder, unspecified: Secondary | ICD-10-CM

## 2023-05-30 DIAGNOSIS — R4789 Other speech disturbances: Secondary | ICD-10-CM | POA: Diagnosis not present

## 2023-05-30 DIAGNOSIS — J301 Allergic rhinitis due to pollen: Secondary | ICD-10-CM | POA: Diagnosis not present

## 2023-05-30 NOTE — Therapy (Signed)
OUTPATIENT SPEECH LANGUAGE PATHOLOGY  TREATMENT NOTE   Patient Name: Francisco Cisneros MRN: [id number] DOB:10-17-1985, 37 y.o., male Today's Date: 05/31/2023  PCP: Farris Has, MD REFERRING PROVIDER: Lynnell Grain, MD   End of Session - 05/30/23 1637     Visit Number 2    Number of Visits 17    Date for SLP Re-Evaluation 07/23/23    Authorization Type Medicare A/ Medicare Part B    Progress Note Due on Visit 10    SLP Start Time 1400    SLP Stop Time  1445    SLP Time Calculation (min) 45 min    Activity Tolerance Patient tolerated treatment well             No past medical history on file.  The histories are not reviewed yet. Please review them in the "History" navigator section and refresh this SmartLink. 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   Right bundle branch block 02/09/2022   Seasonal and perennial allergic rhinoconjunctivitis 07/10/2021   History of esophagogastroduodenoscopy (EGD) 06/18/2018   Anaphylactic reaction due to food, subsequent encounter 03/19/2017   Atopic dermatitis 03/19/2017   Mild persistent asthma without complication 03/19/2017    ONSET DATE: chronic developmental, date of referral  05/21/2023  REFERRING DIAG: Pervasive developmental disorder ICD-10-CM F84.9  THERAPY DIAG:  Motor speech disorder  Pervasive developmental disorder  Social communication disorder  Rationale for Evaluation and Treatment Rehabilitation  SUBJECTIVE:   PERTINENT HISTORY: Pt is a  37 year old male with pervasive developmental disorder who resides at [facility]. He mentions that he had speech therapy during academic time in [place]. 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   He has history of anxiety.    PAIN:  Are you having pain? No   FALLS: Has patient fallen in last 6 months?  No  LIVING ENVIRONMENT: Lives with: lives with their family and lives in an adult home Lives in: House/apartment  PLOF:  Level of assistance: Needed assistance with ADLs, Needed assistance with IADLS Employment: On disability   PATIENT GOALS      To improve my speaking ability  SUBJECTIVE STATEMENT: Pt is eager, accompanied by staff member Marchelle Folks) from [place] Pt accompanied by: Johny Chess staff member Marchelle Folks)  OBJECTIVE:    TODAY'S TREATMENT:  Skilled treatment session focused on pt's speech production and social communication goals. SLP facilitated session by providing the following interventions:  Pt arrived to session with his typed speech that he is giving on Friday during a remembrance service at [facility]. Pt also brought notebook and pen as well as took notes on recommendations. When initially reading speech, pt used decreased vocal intensity and looked down (almost appeared withdrawn to himself). However with very minimal cues pt able to read with head up and "projecting" his voice improving speech intelligibility from ~ 50% to > 85%. Pt also noted ot have increased prosody with "projecting." Pt demonstrated several  insightful moments such as identifying need to increase respiratory support. During  one read through, pt's vocal quality and resonance changed to one that appeared less commensurate with his age. With gentle encouragement/cues, pt able to resume previous vocal quality.   SLP also made the following recommendations about making  speeches/presentations: Pt should practice reading multiple times per day with a peer, family member or staff member Would recommend finalizing responses/speech multiple days in advance of presentation  Also recommend using a heavy weight folder or paper to aid in rustling noise produced by papers in speaker's hands - SLP provided several manila folders for pt's use.   Throughout the session, pt was responsive to cues for re-direction to task.    PATIENT EDUCATION: Education details: see above Person educated: Patient and Parent Education method: Explanation Education comprehension: needs further education   HOME EXERCISE PROGRAM: Bring in his essay on his grandfather  GOALS: Goals reviewed with patient? Yes  SHORT TERM GOALS: Target date: 10 sessions  With Min A, pt will use speech intelligibility strategies to achieve > 95% intelligibility at the simple paragraph level.  Baseline: Moderate Goal status: INITIAL  2.  With Mod to Min A, the patient will produce multiple sentences placing pauses in appropriate places in 80% of opportunities.  Baseline:  Goal status: INITIAL  3.  With Mod to Min A, pt will participate in turn-taking with the therapist for 5 turns per opportunity with a minimum of 5 opportunities across 3 data collections. Baseline:  Goal status: INITIAL  4.  With Mod to Min A, pt will use age-appropriate vocal characteristics (intonation, volume) for 80% of conversational turns during therapy sessions independently for 3 data collections. Baseline:  Goal status: INITIAL   LONG TERM GOALS: Target date: 07/23/2023  With Min A, the patient will complete monologues at least 3 minutes long at 80% intelligibility. Baseline:  Goal status: INITIAL  2.  With supervision, patient will produce multiple sentences placing pauses in appropriate places in 80% of opportunities.  Baseline:  Goal status: INITIAL  3.  With supervision, pt will use age-appropriate vocal  characteristics (intonation, volume) for 80% of conversational turns during therapy sessions independently for 3 data collections. Baseline:  Goal status: INITIAL   ASSESSMENT:  CLINICAL IMPRESSION: Patient is a 37 y.o. male who was seen today for a treatment session to target his speech intelligibility, pragmatic skills in an effort to improve his ability to perform public speaking activities in support of his residence/community, [employer].   He presents with fluctuating periods of decreased intelligibility d/t periods of fast rate of speech, difficulty turn taking, topic maintenance as well as variances in prosody and vocal intensity.   See the above treatment note for details.   OBJECTIVE IMPAIRMENTS include attention, expressive language, and pragmatic/social communication . These impairments are limiting patient from effectively communicating at home and in community. Factors affecting potential to achieve goals and functional outcome are co-morbidities and previous level of function. Patient will benefit from skilled SLP services to address above impairments and improve overall function.  REHAB POTENTIAL: Good  PLAN: SLP FREQUENCY: 1-2x/week  SLP DURATION: 8 weeks  PLANNED INTERVENTIONS: Functional tasks, SLP instruction and feedback, Compensatory strategies, Patient/family education, and social communication    Mateya Torti B. Dreama Saa, M.S., CCC-SLP, Tree surgeon Certified Brain Injury Specialist [facility]  [facility] Rehabilitation Services Office [phone] Ascom [phone] Fax [phone]

## 2023-05-30 NOTE — Progress Notes (Signed)
VIALS EXP 05-29-24

## 2023-05-31 DIAGNOSIS — J3081 Allergic rhinitis due to animal (cat) (dog) hair and dander: Secondary | ICD-10-CM | POA: Diagnosis not present

## 2023-06-04 ENCOUNTER — Ambulatory Visit: Payer: Medicare Other | Admitting: Speech Pathology

## 2023-06-04 DIAGNOSIS — R4789 Other speech disturbances: Secondary | ICD-10-CM | POA: Diagnosis not present

## 2023-06-04 DIAGNOSIS — F849 Pervasive developmental disorder, unspecified: Secondary | ICD-10-CM

## 2023-06-04 DIAGNOSIS — F8089 Other developmental disorders of speech and language: Secondary | ICD-10-CM | POA: Diagnosis not present

## 2023-06-04 NOTE — Therapy (Signed)
OUTPATIENT SPEECH LANGUAGE PATHOLOGY  TREATMENT NOTE   Patient Name: Francisco Cisneros MRN: [id number] DOB:January 15, 1986, 37 y.o., male Today's Date: 06/04/2023  PCP: Farris Has, MD REFERRING PROVIDER: Lynnell Grain, MD   End of Session - 06/04/23 1352     Visit Number 3    Number of Visits 17    Date for SLP Re-Evaluation 07/23/23    Authorization Type Medicare A/ Medicare Part B    Progress Note Due on Visit 10    SLP Start Time 1400    SLP Stop Time  1445    SLP Time Calculation (min) 45 min    Activity Tolerance Patient tolerated treatment well             No past medical history on file.  The histories are not reviewed yet. Please review them in the "History" navigator section and refresh this SmartLink. 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   Right bundle branch block 02/09/2022   Seasonal and perennial allergic rhinoconjunctivitis 07/10/2021   History of esophagogastroduodenoscopy (EGD) 06/18/2018   Anaphylactic reaction due to food, subsequent encounter 03/19/2017   Atopic dermatitis 03/19/2017   Mild persistent asthma without complication 03/19/2017    ONSET DATE: chronic developmental, date of referral  05/21/2023  REFERRING DIAG: Pervasive developmental disorder ICD-10-CM F84.9  THERAPY DIAG:  Motor speech disorder  Pervasive developmental disorder  Social communication disorder  Rationale for Evaluation and Treatment Rehabilitation  SUBJECTIVE:   PERTINENT HISTORY: Pt is a  37 year old male with pervasive developmental disorder who resides at [facility]. He mentions that he had speech therapy during academic time in [place]. 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   He has history of anxiety.    PAIN:  Are you having pain? No   FALLS: Has patient fallen in last 6 months?  No  LIVING ENVIRONMENT: Lives with: lives with their family and lives in an adult home Lives in: House/apartment  PLOF:  Level of assistance: Needed assistance with ADLs, Needed assistance with IADLS Employment: On disability   PATIENT GOALS      To improve my speaking ability  SUBJECTIVE STATEMENT: "Do you have a chaplain that I can talk too because I am really sad about my grandpa" Pt accompanied by: [employer] staff member Marchelle Folks) who stayed in the lobby area  OBJECTIVE:    TODAY'S TREATMENT:  Skilled treatment session focused on pt's speech production and social communication goals. SLP facilitated session by providing the following interventions:  Pt arrived to session experiencing sadness about his grandfather's passing and states that his parents out of town in [place] for his funeral. Pt requesting to speak with hospital based chaplain with this Clinical research associate providing emotional support. SLP further facilitated session by providing some interview based questions regarding [facility] ("what is your favorite fun memory, happiness activity") Pt struggled more this session with topic maintenance d/t sadness with his grandfather's passing as well as needing conflict resolution with another resident. Pt's speech was fluent  without repetition.   In an effort to help pt with topic maintenance, SLP assigned HEP of writing down presentation about his vision for a media center at [facility], rules for operation, how to obtain media.       PATIENT EDUCATION: Education  details: see above Person educated: Patient and staff member from [employer]  Education method: Explanation Education comprehension: needs further education   HOME EXERCISE PROGRAM: Create and bring in essay on media center for [employer]  GOALS: Goals reviewed with patient? Yes  SHORT TERM GOALS: Target date: 10 sessions  With Min A, pt will use speech intelligibility strategies to achieve > 95% intelligibility at the simple paragraph level.  Baseline: Moderate Goal status: INITIAL  2.  With Mod to Min A, the patient will produce multiple sentences placing pauses in appropriate places in 80% of opportunities.  Baseline:  Goal status: INITIAL  3.  With Mod to Min A, pt will participate in turn-taking with the therapist for 5 turns per opportunity with a minimum of 5 opportunities across 3 data collections. Baseline:  Goal status: INITIAL  4.  With Mod to Min A, pt will use age-appropriate vocal characteristics (intonation, volume) for 80% of conversational turns during therapy sessions independently for 3 data collections. Baseline:  Goal status: INITIAL   LONG TERM GOALS: Target date: 07/23/2023  With Min A, the patient will complete monologues at least 3 minutes long at 80% intelligibility. Baseline:  Goal status: INITIAL  2.  With supervision, patient will produce multiple sentences placing pauses in appropriate places in 80% of opportunities.  Baseline:  Goal status: INITIAL  3.  With supervision, pt will use age-appropriate vocal characteristics (intonation, volume) for 80% of conversational turns during therapy sessions independently for 3 data collections. Baseline:  Goal status: INITIAL   ASSESSMENT:  CLINICAL IMPRESSION: Patient is a 37 y.o. male who was seen today for a treatment session to target his speech intelligibility, pragmatic skills in an effort to improve his ability to perform public speaking activities in support of his residence/community,  [employer].   He presents with fluctuating periods of decreased intelligibility d/t periods of fast rate of speech, difficulty turn taking, topic maintenance as well as variances in prosody and vocal intensity.   Pt presented with decreased topic maintenance and resultant decreased verbal organization. See the above treatment note for details.   OBJECTIVE IMPAIRMENTS include attention, expressive language, and pragmatic/social communication . These impairments are limiting patient from effectively communicating at home and in community. Factors affecting potential to achieve goals and functional outcome are co-morbidities and previous level of function. Patient will benefit from skilled SLP services to address above impairments and improve overall function.  REHAB POTENTIAL: Good  PLAN: SLP FREQUENCY: 1-2x/week  SLP DURATION: 8 weeks  PLANNED INTERVENTIONS: Functional tasks, SLP instruction and feedback, Compensatory strategies, Patient/family education, and social communication    Marlin Jarrard B. Dreama Saa, M.S., CCC-SLP, Tree surgeon Certified Brain Injury Specialist [facility]  [facility] Rehabilitation Services Office [phone] Ascom [phone] Fax [phone]

## 2023-06-06 ENCOUNTER — Ambulatory Visit: Payer: Medicare Other | Admitting: Speech Pathology

## 2023-06-06 DIAGNOSIS — F8089 Other developmental disorders of speech and language: Secondary | ICD-10-CM

## 2023-06-06 DIAGNOSIS — R4789 Other speech disturbances: Secondary | ICD-10-CM

## 2023-06-06 DIAGNOSIS — F849 Pervasive developmental disorder, unspecified: Secondary | ICD-10-CM | POA: Diagnosis not present

## 2023-06-06 NOTE — Therapy (Signed)
OUTPATIENT SPEECH LANGUAGE PATHOLOGY  TREATMENT NOTE   Patient Name: Francisco Cisneros MRN: [id number] DOB:25-May-1986, 37 y.o., male Today's Date: 06/06/2023  PCP: Farris Has, MD REFERRING PROVIDER: Lynnell Grain, MD   End of Session - 06/06/23 1015     Visit Number 4    Number of Visits 17    Date for SLP Re-Evaluation 07/23/23    Authorization Type Medicare A/ Medicare Part B    Progress Note Due on Visit 10    SLP Start Time 1015    SLP Stop Time  1100    SLP Time Calculation (min) 45 min    Activity Tolerance Patient tolerated treatment well             No past medical history on file.  The histories are not reviewed yet. Please review them in the "History" navigator section and refresh this SmartLink. 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   Right bundle branch block 02/09/2022   Seasonal and perennial allergic rhinoconjunctivitis 07/10/2021   History of esophagogastroduodenoscopy (EGD) 06/18/2018   Anaphylactic reaction due to food, subsequent encounter 03/19/2017   Atopic dermatitis 03/19/2017   Mild persistent asthma without complication 03/19/2017    ONSET DATE: chronic developmental, date of referral  05/21/2023  REFERRING DIAG: Pervasive developmental disorder ICD-10-CM F84.9  THERAPY DIAG:  Motor speech disorder  Pervasive developmental disorder  Social communication disorder  Rationale for Evaluation and Treatment Rehabilitation  SUBJECTIVE:   PERTINENT HISTORY: Pt is a  37 year old male with pervasive developmental disorder who resides at [facility]. He mentions that he had speech therapy during academic time in [place]. 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   He has history of anxiety.    PAIN:  Are you having pain? No   FALLS: Has patient fallen in last 6 months?  No  LIVING ENVIRONMENT: Lives with: lives with their family and lives in an adult home Lives in: House/apartment  PLOF:  Level of assistance: Needed assistance with ADLs, Needed assistance with IADLS Employment: On disability   PATIENT GOALS      To improve my speaking ability  SUBJECTIVE STATEMENT: "I met with my counselor yesterday, it went well" Pt accompanied by: [employer] staff member Marchelle Folks)   OBJECTIVE:    TODAY'S TREATMENT:  Skilled treatment session focused on pt's speech production and social communication goals. SLP facilitated session by providing the following interventions:  Pt brought in written "pitch for media center." Pt was able to read thru with ~ 75% speech intelligibility improving to > 95% intelligibility with min A cues to slow rate of speech. SLP provided some recommendations for contain with SLP writing down potential connective sentence during one instance of disconnected information. Pt in agreement with suggestion.   SLP also provided instruction on stages of planning media center. Pt independently writing down information.   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 Pt independently wrote down recommendations once cued.       PATIENT EDUCATION: Education details: see above Person educated: Patient and staff member from [employer]  Education  method: Explanation Education comprehension: needs further education   HOME EXERCISE PROGRAM: Create and bring in essay on media center for [employer]  GOALS: Goals reviewed with patient? Yes  SHORT TERM GOALS: Target date: 10 sessions  With Min A, pt will use speech intelligibility strategies to achieve > 95% intelligibility at the simple paragraph level.  Baseline: Moderate Goal status: INITIAL  2.  With Mod to Min A, the patient will produce multiple sentences placing pauses in appropriate places in 80% of opportunities.  Baseline:  Goal status: INITIAL  3.  With Mod to Min A, pt will participate in turn-taking with the therapist for 5 turns per opportunity with a minimum of 5 opportunities across 3 data collections. Baseline:  Goal status: INITIAL  4.  With Mod to Min A, pt will use age-appropriate vocal characteristics (intonation, volume) for 80% of conversational turns during therapy sessions independently for 3 data collections. Baseline:  Goal status: INITIAL   LONG TERM GOALS: Target date: 07/23/2023  With Min A, the patient will complete monologues at least 3 minutes long at 80% intelligibility. Baseline:  Goal status: INITIAL  2.  With supervision, patient will produce multiple sentences placing pauses in appropriate places in 80% of opportunities.  Baseline:  Goal status: INITIAL  3.  With supervision, pt will use age-appropriate vocal characteristics (intonation, volume) for 80% of conversational turns during therapy sessions independently for 3 data collections. Baseline:  Goal status: INITIAL   ASSESSMENT:  CLINICAL IMPRESSION: Patient is a 37 y.o. male who was seen today for a treatment session to target his speech intelligibility, pragmatic skills in an effort to improve his ability to perform public speaking activities in support of his residence/community, [employer].   He presents with fluctuating periods of decreased  intelligibility d/t periods of fast rate of speech, difficulty turn taking, topic maintenance as well as variances in prosody and vocal intensity.   Pt included great details including the physical description, purpose and types of media that he would like to see included in a new medica center at [facility]. He is also very receptive to all recommendations and use of speech intelligibility strategies. See the above treatment note for details.   OBJECTIVE IMPAIRMENTS include attention, expressive language, and pragmatic/social communication . These impairments are limiting patient from effectively communicating at home and in community. Factors affecting potential to achieve goals and functional outcome are co-morbidities and previous level of function. Patient will benefit from skilled SLP services to address above impairments and improve overall function.  REHAB POTENTIAL: Good  PLAN: SLP FREQUENCY: 1-2x/week  SLP DURATION: 8 weeks  PLANNED INTERVENTIONS: Functional tasks, SLP instruction and feedback, Compensatory strategies, Patient/family education, and social communication    Leoda Smithhart B. Dreama Saa, M.S., CCC-SLP, Tree surgeon Certified Brain Injury Specialist [facility]  [facility] Rehabilitation Services Office [phone] Ascom [phone] Fax [phone]

## 2023-06-11 ENCOUNTER — Ambulatory Visit: Payer: Medicare Other | Attending: Psychiatry | Admitting: Speech Pathology

## 2023-06-11 DIAGNOSIS — R4789 Other speech disturbances: Secondary | ICD-10-CM | POA: Diagnosis not present

## 2023-06-11 DIAGNOSIS — F849 Pervasive developmental disorder, unspecified: Secondary | ICD-10-CM | POA: Insufficient documentation

## 2023-06-11 DIAGNOSIS — F8089 Other developmental disorders of speech and language: Secondary | ICD-10-CM | POA: Insufficient documentation

## 2023-06-11 NOTE — Therapy (Deleted)
OUTPATIENT SPEECH LANGUAGE PATHOLOGY  TREATMENT NOTE   Patient Name: Francisco Cisneros MRN: [id number] DOB:December 02, 1985, 37 y.o., male Today's Date: 06/11/2023  PCP: Farris Has, MD REFERRING PROVIDER: Lynnell Grain, MD   End of Session - 06/11/23 1108     Visit Number 5    Number of Visits 17    Date for SLP Re-Evaluation 07/23/23    Authorization Type Medicare A/ Medicare Part B    Progress Note Due on Visit 10    SLP Start Time 1100    SLP Stop Time  1145    SLP Time Calculation (min) 45 min    Activity Tolerance Patient tolerated treatment well             No past medical history on file.  The histories are not reviewed yet. Please review them in the "History" navigator section and refresh this SmartLink. 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   Right bundle branch block 02/09/2022   Seasonal and perennial allergic rhinoconjunctivitis 07/10/2021   History of esophagogastroduodenoscopy (EGD) 06/18/2018   Anaphylactic reaction due to food, subsequent encounter 03/19/2017   Atopic dermatitis 03/19/2017   Mild persistent asthma without complication 03/19/2017    ONSET DATE: chronic developmental, date of referral  05/21/2023  REFERRING DIAG: Pervasive developmental disorder ICD-10-CM F84.9  THERAPY DIAG:  Motor speech disorder  Pervasive developmental disorder  Social communication disorder  Rationale for Evaluation and Treatment Rehabilitation  SUBJECTIVE:   PERTINENT HISTORY: Pt is a  37 year old male with pervasive developmental disorder who resides at [facility]. He mentions that he had speech therapy during academic time in [place]. 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   He has history of anxiety.    PAIN:  Are you having pain? No   FALLS: Has patient fallen in last 6 months?  No  LIVING ENVIRONMENT: Lives with: lives with their family and lives in an adult home Lives in: House/apartment  PLOF:  Level of assistance: Needed assistance with ADLs, Needed assistance with IADLS Employment: On disability   PATIENT GOALS      To improve my speaking ability  SUBJECTIVE STATEMENT: "I met with my counselor yesterday, it went well" Pt accompanied by: [employer] staff member Marchelle Folks)   OBJECTIVE:    TODAY'S TREATMENT:  Skilled treatment session focused on pt's speech production and social communication goals. SLP facilitated session by providing the following interventions:  Pt brought in written "pitch for media center." Pt was able to read thru with ~ 75% speech intelligibility improving to > 95% intelligibility with min A cues to slow rate of speech. SLP provided some recommendations for contain with SLP writing down potential connective sentence during one instance of disconnected information. Pt in agreement with suggestion.   SLP also provided instruction on stages of planning media center. Pt independently writing down information.   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 Pt independently wrote down recommendations once cued.       PATIENT EDUCATION: Education details: see above Person educated: Patient and staff member from [employer]  Education  method: Explanation Education comprehension: needs further education   HOME EXERCISE PROGRAM: Create and bring in essay on media center for [employer]  GOALS: Goals reviewed with patient? Yes  SHORT TERM GOALS: Target date: 10 sessions  With Min A, pt will use speech intelligibility strategies to achieve > 95% intelligibility at the simple paragraph level.  Baseline: Moderate Goal status: INITIAL  2.  With Mod to Min A, the patient will produce multiple sentences placing pauses in appropriate places in 80% of opportunities.  Baseline:  Goal status: INITIAL  3.  With Mod to Min A, pt will participate in turn-taking with the therapist for 5 turns per opportunity with a minimum of 5 opportunities across 3 data collections. Baseline:  Goal status: INITIAL  4.  With Mod to Min A, pt will use age-appropriate vocal characteristics (intonation, volume) for 80% of conversational turns during therapy sessions independently for 3 data collections. Baseline:  Goal status: INITIAL   LONG TERM GOALS: Target date: 07/23/2023  With Min A, the patient will complete monologues at least 3 minutes long at 80% intelligibility. Baseline:  Goal status: INITIAL  2.  With supervision, patient will produce multiple sentences placing pauses in appropriate places in 80% of opportunities.  Baseline:  Goal status: INITIAL  3.  With supervision, pt will use age-appropriate vocal characteristics (intonation, volume) for 80% of conversational turns during therapy sessions independently for 3 data collections. Baseline:  Goal status: INITIAL   ASSESSMENT:  CLINICAL IMPRESSION: Patient is a 37 y.o. male who was seen today for a treatment session to target his speech intelligibility, pragmatic skills in an effort to improve his ability to perform public speaking activities in support of his residence/community, [employer].   He presents with fluctuating periods of decreased  intelligibility d/t periods of fast rate of speech, difficulty turn taking, topic maintenance as well as variances in prosody and vocal intensity.   Pt included great details including the physical description, purpose and types of media that he would like to see included in a new medica center at [facility]. He is also very receptive to all recommendations and use of speech intelligibility strategies. See the above treatment note for details.   OBJECTIVE IMPAIRMENTS include attention, expressive language, and pragmatic/social communication . These impairments are limiting patient from effectively communicating at home and in community. Factors affecting potential to achieve goals and functional outcome are co-morbidities and previous level of function. Patient will benefit from skilled SLP services to address above impairments and improve overall function.  REHAB POTENTIAL: Good  PLAN: SLP FREQUENCY: 1-2x/week  SLP DURATION: 8 weeks  PLANNED INTERVENTIONS: Functional tasks, SLP instruction and feedback, Compensatory strategies, Patient/family education, and social communication    Elizardo Chilson B. Dreama Saa, M.S., CCC-SLP, Tree surgeon Certified Brain Injury Specialist [facility]  [facility] Office [phone] Ascom [phone] Fax [phone]

## 2023-06-11 NOTE — Therapy (Signed)
OUTPATIENT SPEECH LANGUAGE PATHOLOGY  TREATMENT NOTE   Patient Name: Francisco Cisneros MRN: [id number] DOB:December 02, 1985, 37 y.o., male Today's Date: 06/11/2023  PCP: Farris Has, MD REFERRING PROVIDER: Lynnell Grain, MD   End of Session - 06/11/23 1108     Visit Number 5    Number of Visits 17    Date for SLP Re-Evaluation 07/23/23    Authorization Type Medicare A/ Medicare Part B    Progress Note Due on Visit 10    SLP Start Time 1100    SLP Stop Time  1145    SLP Time Calculation (min) 45 min    Activity Tolerance Patient tolerated treatment well             No past medical history on file.  The histories are not reviewed yet. Please review them in the "History" navigator section and refresh this SmartLink. 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   Right bundle branch block 02/09/2022   Seasonal and perennial allergic rhinoconjunctivitis 07/10/2021   History of esophagogastroduodenoscopy (EGD) 06/18/2018   Anaphylactic reaction due to food, subsequent encounter 03/19/2017   Atopic dermatitis 03/19/2017   Mild persistent asthma without complication 03/19/2017    ONSET DATE: chronic developmental, date of referral  05/21/2023  REFERRING DIAG: Pervasive developmental disorder ICD-10-CM F84.9  THERAPY DIAG:  Motor speech disorder  Pervasive developmental disorder  Social communication disorder  Rationale for Evaluation and Treatment Rehabilitation  SUBJECTIVE:   PERTINENT HISTORY: Pt is a  37 year old male with pervasive developmental disorder who resides at [facility]. He mentions that he had speech therapy during academic time in [place]. 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   He has history of anxiety.    PAIN:  Are you having pain? No   FALLS: Has patient fallen in last 6 months?  No  LIVING ENVIRONMENT: Lives with: lives with their family and lives in an adult home Lives in: House/apartment  PLOF:  Level of assistance: Needed assistance with ADLs, Needed assistance with IADLS Employment: On disability   PATIENT GOALS      To improve my speaking ability  SUBJECTIVE STATEMENT: "I met with my counselor yesterday, it went well" Pt accompanied by: [employer] staff member Marchelle Folks)   OBJECTIVE:    TODAY'S TREATMENT:  Skilled treatment session focused on pt's speech production and social communication goals. SLP facilitated session by providing the following interventions:  Pt brought in written "pitch for media center." Pt was able to read thru with ~ 75% speech intelligibility improving to > 95% intelligibility with min A cues to slow rate of speech. SLP provided some recommendations for contain with SLP writing down potential connective sentence during one instance of disconnected information. Pt in agreement with suggestion.   SLP also provided instruction on stages of planning media center. Pt independently writing down information.   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 Pt independently wrote down recommendations once cued.       PATIENT EDUCATION: Education details: see above Person educated: Patient and staff member from [employer]  Education  method: Explanation Education comprehension: needs further education   HOME EXERCISE PROGRAM: Create and bring in essay on media center for [employer]  GOALS: Goals reviewed with patient? Yes  SHORT TERM GOALS: Target date: 10 sessions  With Min A, pt will use speech intelligibility strategies to achieve > 95% intelligibility at the simple paragraph level.  Baseline: Moderate Goal status: INITIAL  2.  With Mod to Min A, the patient will produce multiple sentences placing pauses in appropriate places in 80% of opportunities.  Baseline:  Goal status: INITIAL  3.  With Mod to Min A, pt will participate in turn-taking with the therapist for 5 turns per opportunity with a minimum of 5 opportunities across 3 data collections. Baseline:  Goal status: INITIAL  4.  With Mod to Min A, pt will use age-appropriate vocal characteristics (intonation, volume) for 80% of conversational turns during therapy sessions independently for 3 data collections. Baseline:  Goal status: INITIAL   LONG TERM GOALS: Target date: 07/23/2023  With Min A, the patient will complete monologues at least 3 minutes long at 80% intelligibility. Baseline:  Goal status: INITIAL  2.  With supervision, patient will produce multiple sentences placing pauses in appropriate places in 80% of opportunities.  Baseline:  Goal status: INITIAL  3.  With supervision, pt will use age-appropriate vocal characteristics (intonation, volume) for 80% of conversational turns during therapy sessions independently for 3 data collections. Baseline:  Goal status: INITIAL   ASSESSMENT:  CLINICAL IMPRESSION: Patient is a 37 y.o. male who was seen today for a treatment session to target his speech intelligibility, pragmatic skills in an effort to improve his ability to perform public speaking activities in support of his residence/community, [employer].   He presents with fluctuating periods of decreased  intelligibility d/t periods of fast rate of speech, difficulty turn taking, topic maintenance as well as variances in prosody and vocal intensity.   Pt included great details including the physical description, purpose and types of media that he would like to see included in a new medica center at [facility]. He is also very receptive to all recommendations and use of speech intelligibility strategies. See the above treatment note for details.   OBJECTIVE IMPAIRMENTS include attention, expressive language, and pragmatic/social communication . These impairments are limiting patient from effectively communicating at home and in community. Factors affecting potential to achieve goals and functional outcome are co-morbidities and previous level of function. Patient will benefit from skilled SLP services to address above impairments and improve overall function.  REHAB POTENTIAL: Good  PLAN: SLP FREQUENCY: 1-2x/week  SLP DURATION: 8 weeks  PLANNED INTERVENTIONS: Functional tasks, SLP instruction and feedback, Compensatory strategies, Patient/family education, and social communication    Elizardo Chilson B. Dreama Saa, M.S., CCC-SLP, Tree surgeon Certified Brain Injury Specialist [facility]  [facility] Office [phone] Ascom [phone] Fax [phone]

## 2023-06-13 ENCOUNTER — Ambulatory Visit: Payer: Medicare Other | Admitting: Speech Pathology

## 2023-06-13 DIAGNOSIS — R4789 Other speech disturbances: Secondary | ICD-10-CM

## 2023-06-13 DIAGNOSIS — F849 Pervasive developmental disorder, unspecified: Secondary | ICD-10-CM

## 2023-06-13 DIAGNOSIS — F8089 Other developmental disorders of speech and language: Secondary | ICD-10-CM | POA: Diagnosis not present

## 2023-06-13 NOTE — Therapy (Signed)
OUTPATIENT SPEECH LANGUAGE PATHOLOGY  TREATMENT NOTE   Patient Name: Francisco Cisneros MRN: [id number] DOB:06-15-1986, 38 y.o., male Today's Date: 06/13/2023  PCP: Farris Has, MD REFERRING PROVIDER: Lynnell Grain, MD   End of Session - 06/13/23 1532     Visit Number 6    Number of Visits 17    Date for SLP Re-Evaluation 07/23/23    Authorization Type Medicare A/ Medicare Part B    Progress Note Due on Visit 10    SLP Start Time 1530    SLP Stop Time  1615    SLP Time Calculation (min) 45 min    Activity Tolerance Patient tolerated treatment well             No past medical history on file.  The histories are not reviewed yet. Please review them in the "History" navigator section and refresh this SmartLink. 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   Right bundle branch block 02/09/2022   Seasonal and perennial allergic rhinoconjunctivitis 07/10/2021   History of esophagogastroduodenoscopy (EGD) 06/18/2018   Anaphylactic reaction due to food, subsequent encounter 03/19/2017   Atopic dermatitis 03/19/2017   Mild persistent asthma without complication 03/19/2017    ONSET DATE: chronic developmental, date of referral  05/21/2023  REFERRING DIAG: Pervasive developmental disorder ICD-10-CM F84.9  THERAPY DIAG:  Motor speech disorder  Pervasive developmental disorder  Social communication disorder  Rationale for Evaluation and Treatment Rehabilitation  SUBJECTIVE:   PERTINENT HISTORY: Pt is a  37 year old male with pervasive developmental disorder who resides at [facility]. He mentions that he had speech therapy during academic time in [place]. 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   He has history of anxiety.    PAIN:  Are you having pain? No   FALLS: Has patient fallen in last 6 months?  No  LIVING ENVIRONMENT: Lives with: lives with their family and lives in an adult home Lives in: House/apartment  PLOF:  Level of assistance: Needed assistance with ADLs, Needed assistance with IADLS Employment: On disability   PATIENT GOALS      To improve my speaking ability  SUBJECTIVE STATEMENT: "We had to cancel December 19th" Pt accompanied by: Johny [employer] staff member Marchelle Folks) - she stayed in lobby during today's session  OBJECTIVE:    TODAY'S TREATMENT:  Skilled treatment session focused on pt's speech production and social communication goals. SLP facilitated session by providing the following interventions:  Pt arrived to session reporting that his presentation on [place] went "really good." He also reports running out of time to finish his new essay "I did it in the car." Despite being rushed, pt presented with interesting information that held listener's attention. When reading the information, his pitch and vocal quality remained appropriate   SLP also engaged pt in "off-the-cuff" questions conversational topics about [place] and corresponding activities. Pt responded to each question with appropriate rate and pitch. His speech intelligibility was > 95% and his prosody was inviting to the listener. Additionally,  his use of details was extremely supportive.    PATIENT EDUCATION: Education details: see above Person educated: Patient and staff member from 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 Yes  SHORT TERM GOALS: Target date: 10 sessions  With Min A, pt will use speech intelligibility strategies to achieve > 95% intelligibility at the simple paragraph level.  Baseline: Moderate Goal status: INITIAL  2.  With Mod to Min A, the patient will produce multiple sentences placing pauses in appropriate places in 80% of opportunities.  Baseline:  Goal status: INITIAL  3.  With Mod to Min A, pt will participate in turn-taking with the therapist for 5 turns per opportunity with a minimum of 5 opportunities across 3 data collections. Baseline:  Goal status: INITIAL  4.  With Mod to Min A, pt will use age-appropriate vocal characteristics (intonation, volume) for 80% of conversational turns during therapy sessions independently for 3 data collections. Baseline:  Goal status: INITIAL   LONG TERM GOALS: Target date: 07/23/2023  With Min A, the patient will complete monologues at least 3 minutes long at 80% intelligibility. Baseline:  Goal status: INITIAL  2.  With supervision, patient will produce multiple sentences placing pauses in appropriate places in 80% of opportunities.  Baseline:  Goal status: INITIAL  3.  With supervision, pt will use age-appropriate vocal characteristics (intonation, volume) for 80% of conversational turns during therapy sessions independently for 3 data collections. Baseline:  Goal status: INITIAL   ASSESSMENT:  CLINICAL IMPRESSION: Patient is a 37 y.o. male who was seen today for a treatment session to target his speech intelligibility, pragmatic skills in an effort to improve his ability to perform public speaking activities in support of his residence/community, [employer]. He presents with improving fluctuating periods of decreased intelligibility d/t periods of fast rate of speech, difficulty turn taking, topic maintenance as  well as variances in prosody and vocal intensity.   Pt continues with consistent completion of HEP, engagement in all tasks. As a result, he presentation of information continues to improve.   See the above treatment note for details.   OBJECTIVE IMPAIRMENTS include attention, expressive language, and pragmatic/social communication . These impairments are limiting patient from effectively communicating at home and in community. Factors affecting potential to achieve goals and functional outcome are co-morbidities and previous level of function. Patient will benefit from skilled SLP services to address above impairments and improve overall function.  REHAB POTENTIAL: Good  PLAN: SLP FREQUENCY: 1-2x/week  SLP DURATION: 8 weeks  PLANNED INTERVENTIONS: Functional tasks, SLP instruction and feedback, Compensatory strategies, Patient/family education, and social communication    Mikena Masoner B. Dreama Saa, M.S., CCC-SLP, Tree surgeon Certified Brain Injury Specialist [facility]  [facility] Office [phone] Ascom [phone] Fax [phone]

## 2023-06-17 ENCOUNTER — Ambulatory Visit (INDEPENDENT_AMBULATORY_CARE_PROVIDER_SITE_OTHER): Payer: Medicare Other

## 2023-06-17 DIAGNOSIS — J309 Allergic rhinitis, unspecified: Secondary | ICD-10-CM

## 2023-06-18 ENCOUNTER — Ambulatory Visit: Payer: Medicare Other | Admitting: Speech Pathology

## 2023-06-18 DIAGNOSIS — F849 Pervasive developmental disorder, unspecified: Secondary | ICD-10-CM

## 2023-06-18 DIAGNOSIS — F8089 Other developmental disorders of speech and language: Secondary | ICD-10-CM

## 2023-06-18 DIAGNOSIS — R4789 Other speech disturbances: Secondary | ICD-10-CM | POA: Diagnosis not present

## 2023-06-18 NOTE — Therapy (Signed)
OUTPATIENT SPEECH LANGUAGE PATHOLOGY  TREATMENT NOTE   Patient Name: Francisco Cisneros MRN: [id number] DOB:13-Mar-1986, 37 y.o., male Today's Date: 06/18/2023  PCP: Farris Has, MD REFERRING PROVIDER: Lynnell Grain, MD   End of Session - 06/18/23 1445     Visit Number 7    Number of Visits 17    Date for SLP Re-Evaluation 07/23/23    Authorization Type Medicare A/ Medicare Part B    Progress Note Due on Visit 10    SLP Start Time 1445    SLP Stop Time  1530    SLP Time Calculation (min) 45 min    Activity Tolerance Patient tolerated treatment well             No past medical history on file.  The histories are not reviewed yet. Please review them in the "History" navigator section and refresh this SmartLink. 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   Right bundle branch block 02/09/2022   Seasonal and perennial allergic rhinoconjunctivitis 07/10/2021   History of esophagogastroduodenoscopy (EGD) 06/18/2018   Anaphylactic reaction due to food, subsequent encounter 03/19/2017   Atopic dermatitis 03/19/2017   Mild persistent asthma without complication 03/19/2017    ONSET DATE: chronic developmental, date of referral  05/21/2023  REFERRING DIAG: Pervasive developmental disorder ICD-10-CM F84.9  THERAPY DIAG:  Motor speech disorder  Pervasive developmental disorder  Social communication disorder  Rationale for Evaluation and Treatment Rehabilitation  SUBJECTIVE:   PERTINENT HISTORY: Pt is a  37 year old male with pervasive developmental disorder who resides at [facility]. He mentions that he had speech therapy during academic time in [place]. 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   He has history of anxiety.    PAIN:  Are you having pain? No   FALLS: Has patient fallen in last 6 months?  No  LIVING ENVIRONMENT: Lives with: lives with their family and lives in an adult home Lives in: House/apartment  PLOF:  Level of assistance: Needed assistance with ADLs, Needed assistance with IADLS Employment: On disability   PATIENT GOALS      To improve my speaking ability  SUBJECTIVE STATEMENT: "I forgot to practice" Pt accompanied by: [employer] staff member Marchelle Folks) - she stayed in lobby during today's session  OBJECTIVE:    TODAY'S TREATMENT:  Skilled treatment session focused on pt's speech production and social communication goals. SLP facilitated session by providing the following interventions:  Pt continues to complete assigned activities. He read presentation with independent use of speech intelligibility strategies such as using his finger to slow rate as well good prosody.   During "off the cuff" questions regarding upcoming [facility], pt was able to also use great prosody and speech to answer each question with > 95% intelligibility. Pt's responses inspired listener intrigue.   In addition, pt with increased self-awareness of off-topic questions and had only 1 off-topic question.    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 Yes  SHORT TERM GOALS: Target date: 10 sessions  With  Min A, pt will use speech intelligibility strategies to achieve > 95% intelligibility at the simple paragraph level.  Baseline: Moderate Goal status: INITIAL  2.  With Mod to Min A, the patient will produce multiple sentences placing pauses in appropriate places in 80% of opportunities.  Baseline:  Goal status: INITIAL  3.  With Mod to Min A, pt will participate in turn-taking with the therapist for 5 turns per opportunity with a minimum of 5 opportunities across 3 data collections. Baseline:  Goal status: INITIAL  4.  With Mod to Min A, pt will use age-appropriate vocal characteristics (intonation, volume) for 80% of conversational turns during therapy sessions independently for 3 data collections. Baseline:  Goal status: INITIAL   LONG TERM GOALS: Target date: 07/23/2023  With Min A, the patient will complete monologues at least 3 minutes long at 80% intelligibility. Baseline:  Goal status: INITIAL  2.  With supervision, patient will produce multiple sentences placing pauses in appropriate places in 80% of opportunities.  Baseline:  Goal status: INITIAL  3.  With supervision, pt will use age-appropriate vocal characteristics (intonation, volume) for 80% of conversational turns during therapy sessions independently for 3 data collections. Baseline:  Goal status: INITIAL   ASSESSMENT:  CLINICAL IMPRESSION: Patient is a 37 y.o. male who was seen today for a treatment session to target his speech intelligibility, pragmatic skills in an effort to improve his ability to perform public speaking activities in support of his residence/community, [employer]. He presents with improving fluctuating periods of decreased intelligibility d/t periods of fast rate of speech, difficulty turn taking, topic maintenance as well as variances in prosody and vocal intensity.   Follow up provided to pt's dad via email on pt's progress.   See the above treatment note for details.    OBJECTIVE IMPAIRMENTS include attention, expressive language, and pragmatic/social communication . These impairments are limiting patient from effectively communicating at home and in community. Factors affecting potential to achieve goals and functional outcome are co-morbidities and previous level of function. Patient will benefit from skilled SLP services to address above impairments and improve overall function.  REHAB POTENTIAL: Good  PLAN: SLP FREQUENCY: 1-2x/week  SLP DURATION: 8 weeks  PLANNED INTERVENTIONS: Functional tasks, SLP instruction and feedback, Compensatory strategies, Patient/family education, and social communication    Kyser Wandel B. Dreama Saa, M.S., CCC-SLP, Tree surgeon Certified Brain Injury Specialist [facility]  [facility] Rehabilitation Services Office [phone] Ascom [phone] Fax [phone]

## 2023-06-20 ENCOUNTER — Ambulatory Visit: Payer: Medicare Other | Admitting: Speech Pathology

## 2023-06-20 DIAGNOSIS — F8089 Other developmental disorders of speech and language: Secondary | ICD-10-CM

## 2023-06-20 DIAGNOSIS — F849 Pervasive developmental disorder, unspecified: Secondary | ICD-10-CM

## 2023-06-20 DIAGNOSIS — R4789 Other speech disturbances: Secondary | ICD-10-CM

## 2023-06-20 NOTE — Therapy (Signed)
OUTPATIENT SPEECH LANGUAGE PATHOLOGY  TREATMENT NOTE   Patient Name: Francisco Cisneros MRN: [id number] DOB:[birth year], 37 y.o., male Today's Date: 06/20/2023  PCP: Farris Has, MD REFERRING PROVIDER: Lynnell Grain, MD   End of Session - 06/20/23 1322     Visit Number 8    Number of Visits 17    Date for SLP Re-Evaluation 07/23/23    Authorization Type Medicare A/ Medicare Part B    Progress Note Due on Visit 10    SLP Start Time 1320    SLP Stop Time  1400    SLP Time Calculation (min) 40 min    Activity Tolerance Patient tolerated treatment well             No past medical history on file.  The histories are not reviewed yet. Please review them in the "History" navigator section and refresh this SmartLink. 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   Right bundle branch block 02/09/2022   Seasonal and perennial allergic rhinoconjunctivitis 07/10/2021   History of esophagogastroduodenoscopy (EGD) 06/18/2018   Anaphylactic reaction due to food, subsequent encounter 03/19/2017   Atopic dermatitis 03/19/2017   Mild persistent asthma without complication 03/19/2017    ONSET DATE: chronic developmental, date of referral  05/21/2023  REFERRING DIAG: Pervasive developmental disorder ICD-10-CM F84.9  THERAPY DIAG:  Motor speech disorder  Pervasive developmental disorder  Social communication disorder  Rationale for Evaluation and Treatment Rehabilitation  SUBJECTIVE:   PERTINENT HISTORY: Pt is a  37 year old male with pervasive developmental disorder who resides at [facility]. He mentions that he had speech therapy during academic time in [place]. 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   He has history of anxiety.    PAIN:  Are you having pain? No   FALLS: Has patient fallen in last 6 months?  No  LIVING ENVIRONMENT: Lives with: lives with their family and lives in an adult home Lives in: House/apartment  PLOF:  Level of assistance: Needed assistance with ADLs, Needed assistance with IADLS Employment: On disability   PATIENT GOALS      To improve my speaking ability  SUBJECTIVE STATEMENT: Pt was 5 minutes late to session d/t travel on highway, however pt called receptionist to let us know, great job communicating Pt accompanied by: [employer] staff member Marchelle Folks) - she stayed in lobby during today's session  OBJECTIVE:    TODAY'S TREATMENT:  Skilled treatment session focused on pt's speech production and social communication goals. SLP facilitated session by providing the following interventions:  Pt arrived in a rush with fast rate of speech during initial portion of session, suspect this was due in part to rushing into session.   Session focused on spontaneous conversation as pt didn't have a copy of his homework. Pt with great ability to slow rate of speech during unfamiliar conversation.   SLP further facilitated session by providing conversation cards. Pt was able read each card and answer with fluent speech that was on-topic and complete.  PATIENT EDUCATION: Education details: see above Person educated: Patient and staff member from 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  Yes  SHORT TERM GOALS: Target date: 10 sessions  With Min A, pt will use speech intelligibility strategies to achieve > 95% intelligibility at the simple paragraph level.  Baseline: Moderate Goal status: INITIAL  2.  With Mod to Min A, the patient will produce multiple sentences placing pauses in appropriate places in 80% of opportunities.  Baseline:  Goal status: INITIAL  3.  With Mod to Min A, pt will participate in turn-taking with the therapist for 5 turns per opportunity with a minimum of 5 opportunities across 3 data collections. Baseline:  Goal status: INITIAL  4.  With Mod to Min A, pt will use age-appropriate vocal characteristics (intonation, volume) for 80% of conversational turns during therapy sessions independently for 3 data collections. Baseline:  Goal status: INITIAL   LONG TERM GOALS: Target date: 07/23/2023  With Min A, the patient will complete monologues at least 3 minutes long at 80% intelligibility. Baseline:  Goal status: INITIAL  2.  With supervision, patient will produce multiple sentences placing pauses in appropriate places in 80% of opportunities.  Baseline:  Goal status: INITIAL  3.  With supervision, pt will use age-appropriate vocal characteristics (intonation, volume) for 80% of conversational turns during therapy sessions independently for 3 data collections. Baseline:  Goal status: INITIAL   ASSESSMENT:  CLINICAL IMPRESSION: Patient is a 37 y.o. male who was seen today for a treatment session to target his speech intelligibility, pragmatic skills in an effort to improve his ability to perform public speaking activities in support of his residence/community, [employer]. He presents with improving fluctuating periods of decreased intelligibility d/t periods of fast rate of speech, difficulty turn taking, topic maintenance as well as variances in prosody and vocal intensity.   See the above treatment note for details.    OBJECTIVE IMPAIRMENTS include attention, expressive language, and pragmatic/social communication . These impairments are limiting patient from effectively communicating at home and in community. Factors affecting potential to achieve goals and functional outcome are co-morbidities and previous level of function. Patient will benefit from skilled SLP services to address above impairments and improve overall function.  REHAB POTENTIAL: Good  PLAN: SLP FREQUENCY: 1-2x/week  SLP DURATION: 8 weeks  PLANNED INTERVENTIONS: Functional tasks, SLP instruction and feedback, Compensatory strategies, Patient/family education, and social communication    Marieanne Marxen B. Dreama Saa, M.S., CCC-SLP, Tree surgeon Certified Brain Injury Specialist [facility]  [facility] Rehabilitation Services Office [phone] Ascom [phone] Fax [phone]

## 2023-06-25 ENCOUNTER — Ambulatory Visit: Payer: Medicare Other | Admitting: Speech Pathology

## 2023-06-25 DIAGNOSIS — F849 Pervasive developmental disorder, unspecified: Secondary | ICD-10-CM

## 2023-06-25 DIAGNOSIS — R4789 Other speech disturbances: Secondary | ICD-10-CM | POA: Diagnosis not present

## 2023-06-25 DIAGNOSIS — F8089 Other developmental disorders of speech and language: Secondary | ICD-10-CM | POA: Diagnosis not present

## 2023-06-25 NOTE — Therapy (Signed)
OUTPATIENT SPEECH LANGUAGE PATHOLOGY  TREATMENT NOTE   Patient Name: Francisco Cisneros MRN: [id number] DOB:1985-12-17, 37 y.o., male Today's Date: 06/25/2023  PCP: Farris Has, MD REFERRING PROVIDER: Lynnell Grain, MD   End of Session - 06/25/23 1307     Visit Number 9    Number of Visits 17    Date for SLP Re-Evaluation 07/23/23    Authorization Type Medicare A/ Medicare Part B    Progress Note Due on Visit 10    SLP Start Time 1315    SLP Stop Time  1400    SLP Time Calculation (min) 45 min    Activity Tolerance Patient tolerated treatment well             No past medical history on file.  The histories are not reviewed yet. Please review them in the "History" navigator section and refresh this SmartLink. 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   Right bundle branch block 02/09/2022   Seasonal and perennial allergic rhinoconjunctivitis 07/10/2021   History of esophagogastroduodenoscopy (EGD) 06/18/2018   Anaphylactic reaction due to food, subsequent encounter 03/19/2017   Atopic dermatitis 03/19/2017   Mild persistent asthma without complication 03/19/2017    ONSET DATE: chronic developmental, date of referral  05/21/2023  REFERRING DIAG: Pervasive developmental disorder ICD-10-CM F84.9  THERAPY DIAG:  Motor speech disorder  Pervasive developmental disorder  Social communication disorder  Rationale for Evaluation and Treatment Rehabilitation  SUBJECTIVE:   PERTINENT HISTORY: Pt is a  37 year old male with pervasive developmental disorder who resides at [facility]. He mentions that he had speech therapy during academic time in [place]. 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   He has history of anxiety.    PAIN:  Are you having pain? No   FALLS: Has patient fallen in last 6 months?  No  LIVING ENVIRONMENT: Lives with: lives with their family and lives in an adult home Lives in: House/apartment  PLOF:  Level of assistance: Needed assistance with ADLs, Needed assistance with IADLS Employment: On disability   PATIENT GOALS      To improve my speaking ability  SUBJECTIVE STATEMENT: Pt brought in his homework, very well completed Pt accompanied by: [employer] staff member Marchelle Folks)   OBJECTIVE:    TODAY'S TREATMENT:  Skilled treatment session focused on pt's speech production and social communication goals. SLP facilitated session by providing the following interventions:  Pt with difficulty clearly articulating his thoughts on more abstract feelings. His speech was fluent and very intelligible. SLP also facilitated session by presenting conversation prompts to prompt use of speech intelligibility strategies during conversational/"off the cuff" responses. Pt with great communication of thoughts.   Pt also read his most recent homework about his dream vacation. He read with great fluency, appropriate rate of speech and prosody. The information that he included was also very interesting and made listener intrigued.    PATIENT EDUCATION: Education details: see above Person educated: Patient and staff member from [employer]  method: Explanation Education comprehension: needs further education   HOME EXERCISE PROGRAM: Copies of conversation starters were made with pt to practice these with his core members.   GOALS: Goals reviewed with  patient? Yes  SHORT TERM GOALS: Target date: 10 sessions  With Min A, pt will use speech intelligibility strategies to achieve > 95% intelligibility at the simple paragraph level.  Baseline: Moderate Goal status: INITIAL  2.  With Mod to Min A, the patient will produce multiple sentences placing pauses in appropriate places in 80% of opportunities.  Baseline:  Goal status: INITIAL  3.  With Mod to Min A, pt will participate in turn-taking with the therapist for 5 turns per opportunity with a minimum of 5 opportunities across 3 data collections. Baseline:  Goal status: INITIAL  4.  With Mod to Min A, pt will use age-appropriate vocal characteristics (intonation, volume) for 80% of conversational turns during therapy sessions independently for 3 data collections. Baseline:  Goal status: INITIAL   LONG TERM GOALS: Target date: 07/23/2023  With Min A, the patient will complete monologues at least 3 minutes long at 80% intelligibility. Baseline:  Goal status: INITIAL  2.  With supervision, patient will produce multiple sentences placing pauses in appropriate places in 80% of opportunities.  Baseline:  Goal status: INITIAL  3.  With supervision, pt will use age-appropriate vocal characteristics (intonation, volume) for 80% of conversational turns during therapy sessions independently for 3 data collections. Baseline:  Goal status: INITIAL   ASSESSMENT:  CLINICAL IMPRESSION: Patient is a 37 y.o. male who was seen today for a treatment session to target his speech intelligibility, pragmatic skills in an effort to improve his ability to perform public speaking activities in support of his residence/community, [employer]. He presents with improving communication abilities.   See the above treatment note for details.   OBJECTIVE IMPAIRMENTS include attention, expressive language, and pragmatic/social communication . These impairments are limiting patient from  effectively communicating at home and in community. Factors affecting potential to achieve goals and functional outcome are co-morbidities and previous level of function. Patient will benefit from skilled SLP services to address above impairments and improve overall function.  REHAB POTENTIAL: Good  PLAN: SLP FREQUENCY: 1-2x/week  SLP DURATION: 8 weeks  PLANNED INTERVENTIONS: Functional tasks, SLP instruction and feedback, Compensatory strategies, Patient/family education, and social communication    Kellie Murrill B. Dreama Saa, M.S., CCC-SLP, Tree surgeon Certified Brain Injury Specialist [facility]  [facility] Rehabilitation Services Office [phone] Ascom [phone] Fax [phone]

## 2023-06-27 ENCOUNTER — Ambulatory Visit: Payer: Medicare Other | Admitting: Speech Pathology

## 2023-06-27 DIAGNOSIS — R4789 Other speech disturbances: Secondary | ICD-10-CM

## 2023-06-27 DIAGNOSIS — F849 Pervasive developmental disorder, unspecified: Secondary | ICD-10-CM

## 2023-06-27 DIAGNOSIS — F8089 Other developmental disorders of speech and language: Secondary | ICD-10-CM | POA: Diagnosis not present

## 2023-06-27 NOTE — Therapy (Signed)
OUTPATIENT SPEECH LANGUAGE PATHOLOGY  TREATMENT NOTE 10th VISIT PROGRESS NOTE    Patient Name: Francisco Cisneros MRN: [id number] DOB:07/23/1986, 37 y.o., male Today's Date: 06/27/2023  PCP: Farris Has, MD REFERRING PROVIDER: Lynnell Grain, MD  Speech Therapy Progress Note  Dates of Reporting Period: 05/28/2023 to 06/27/2023  Objective: Patient has been seen for 10 speech therapy sessions this reporting period targeting speech communication. Patient is making progress toward LTGs and met 4 STGs this reporting period. See skilled intervention, clinical impressions, and goals below for details.    End of Session - 06/27/23 1335     Visit Number 10    Number of Visits 17    Date for SLP Re-Evaluation 07/23/23    Authorization Type Medicare A/ Medicare Part B    Progress Note Due on Visit 10    SLP Start Time 1315    SLP Stop Time  1345    SLP Time Calculation (min) 30 min    Activity Tolerance Patient tolerated treatment well             No past medical history on file.  The histories are not reviewed yet. Please review them in the "History" navigator section and refresh this SmartLink. 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   Right bundle branch block 02/09/2022   Seasonal and perennial allergic rhinoconjunctivitis 07/10/2021   History of esophagogastroduodenoscopy (EGD) 06/18/2018   Anaphylactic reaction due to food, subsequent encounter 03/19/2017   Atopic dermatitis 03/19/2017   Mild  persistent asthma without complication 03/19/2017    ONSET DATE: chronic developmental, date of referral  05/21/2023  REFERRING DIAG: Pervasive developmental disorder ICD-10-CM F84.9  THERAPY DIAG:  Motor speech disorder  Pervasive developmental disorder  Social communication disorder  Rationale for Evaluation and Treatment Rehabilitation  SUBJECTIVE:   PERTINENT HISTORY: Pt is a 37 year old male with pervasive developmental disorder who resides at [facility]. He mentions that he had speech therapy during academic time in [place]. 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   He has history of anxiety.    PAIN:  Are you having pain? No   FALLS: Has patient fallen in last 6 months?  No  LIVING ENVIRONMENT: Lives with: lives with their family and lives in an adult home Lives in: House/apartment  PLOF:  Level of assistance: Needed assistance with ADLs, Needed assistance with IADLS Employment: On disability   PATIENT GOALS      To improve my speaking ability  SUBJECTIVE STATEMENT: "[place] the farm by 2pm" Pt accompanied by: [employer] staff member Marchelle Folks) she waited in the lobby  OBJECTIVE:    TODAY'S TREATMENT:  Skilled treatment session focused on pt's speech production and social communication goals. SLP facilitated session by providing the following interventions:  Pt brought in information about potential book club from [employer]. Pt able to explain information with clarity and good verbal organization. Pt also brought a book into session from [employer]. Pt able to  provide additional details about the book and several Christmas movies listed within the book. Pt was Mod I for use of speech intelligibility strategies to achieve > 95% understandability. Pt also wrote down homework assignment but required redirection to topic rather than add  additional work such as Programmer, applications see below.   PATIENT EDUCATION: Education details: see above Person educated: Patient and staff member from [employer]  Education method: Explanation Education comprehension: needs further education   HOME EXERCISE PROGRAM: Write a movie review of "White Christmas" including main characters, plot, and give the movie a review (out of 5 possible stars)  GOALS: Goals reviewed with patient? Yes  SHORT TERM GOALS: Target date: 10 sessions  Updated 06/27/2023 With Min A, pt will use speech intelligibility strategies to achieve > 95% intelligibility at the simple paragraph level.  Baseline: Moderate Goal status: INITIAL: MET - goal updated to - With supervision cues, pt will use speech intelligibility strategies to achieve > 95% intelligibility at the simple paragraph level.   2.  With Mod to Min A, the patient will produce multiple sentences placing pauses in appropriate places in 80% of opportunities.  Baseline:  Goal status: INITIAL: MET - goal updated - With rare Min A, the patient will produce multiple sentences placing pauses in appropriate places in 80% of opportunities.   3.  With Mod to Min A, pt will participate in turn-taking with the therapist for 5 turns per opportunity with a minimum of 5 opportunities across 3 data collections. Baseline:  Goal status: INITIAL: MET - goal updated to - With rare Min A, pt will participate in turn-taking with the therapist for 5 turns per opportunity with a minimum of 5 opportunities across 3 data collections.  4.  With Mod to Min A, pt will use age-appropriate vocal characteristics (intonation, volume) for 80% of conversational turns during therapy sessions independently for 3 data collections. Baseline:  Goal status: INITIAL: MET - goal updated to With rare Min A pt will use age-appropriate vocal characteristic (intonation, volume) for 80% of conversation turns during therapy session independently  for 3 data collections.    LONG TERM GOALS: Target date: 07/23/2023  Updated 06/27/2023 With Min A, the patient will complete monologues at least 3 minutes long at 80% intelligibility. Baseline:  Goal status: INITIAL: MET - upgraded to With supervision, the patient will complete monologues at least 3 minutes long at 80% intelligibility.   2.  With supervision, patient will produce multiple sentences placing pauses in appropriate places in 80% of opportunities.  Baseline:  Goal status: INITIAL: progress made  3.  With supervision, pt will use age-appropriate vocal characteristics (intonation, volume) for 80% of conversational turns during therapy sessions independently for 3 data collections. Baseline:  Goal status: INITIAL: MET   ASSESSMENT:  CLINICAL IMPRESSION: Patient is a 37 y.o. male who was seen today for a treatment session to target his speech intelligibility, pragmatic skills in an effort to improve his ability to perform public speaking activities in support of his residence/community, [employer]. He presents with improving communication abilities during structured and unstructured activities.   Pt has made great progress over the last 10 visits and is on track to meet his LTGs. See the above treatment note for details.   OBJECTIVE IMPAIRMENTS include attention, expressive language, and pragmatic/social communication . These impairments are limiting patient from effectively communicating at home and in community. Factors affecting potential to achieve goals and functional outcome are co-morbidities and previous level of function.  Patient will benefit from skilled SLP services to address above impairments and improve overall function.  REHAB POTENTIAL: Good  PLAN: SLP FREQUENCY: 1-2x/week  SLP DURATION: 8 weeks  PLANNED INTERVENTIONS: Functional tasks, SLP instruction and feedback, Compensatory strategies, Patient/family education, and social  communication    Saide Lanuza B. Dreama Saa, M.S., CCC-SLP, Tree surgeon Certified Brain Injury Specialist [facility]  [facility] Rehabilitation Services Office [phone] Ascom [phone] Fax [phone]

## 2023-07-02 ENCOUNTER — Ambulatory Visit: Payer: Medicare Other | Admitting: Speech Pathology

## 2023-07-02 DIAGNOSIS — F849 Pervasive developmental disorder, unspecified: Secondary | ICD-10-CM | POA: Diagnosis not present

## 2023-07-02 DIAGNOSIS — F8089 Other developmental disorders of speech and language: Secondary | ICD-10-CM | POA: Diagnosis not present

## 2023-07-02 DIAGNOSIS — R4789 Other speech disturbances: Secondary | ICD-10-CM | POA: Diagnosis not present

## 2023-07-02 NOTE — Therapy (Signed)
OUTPATIENT SPEECH LANGUAGE PATHOLOGY  TREATMENT NOTE    Patient Name: Francisco Cisneros MRN: [id number] DOB:[birth year], 37 y.o., male Today's Date: 07/02/2023  PCP: Farris Has, MD REFERRING PROVIDER: Lynnell Grain, MD     End of Session - 07/02/23 1059     Visit Number 11    Number of Visits 17    Date for SLP Re-Evaluation 07/23/23    Authorization Type Medicare A/ Medicare Part B    Progress Note Due on Visit 20    SLP Start Time 1100    SLP Stop Time  1145    SLP Time Calculation (min) 45 min    Activity Tolerance Patient tolerated treatment well             No past medical history on file.  The histories are not reviewed yet. Please review them in the "History" navigator section and refresh this SmartLink. 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   Right bundle branch block 02/09/2022   Seasonal and perennial allergic rhinoconjunctivitis 07/10/2021   History of esophagogastroduodenoscopy (EGD) 06/18/2018   Anaphylactic reaction due to food, subsequent encounter 03/19/2017   Atopic dermatitis 03/19/2017   Mild persistent asthma without complication 03/19/2017    ONSET DATE: chronic developmental, date of referral  05/21/2023  REFERRING DIAG: Pervasive developmental disorder ICD-10-CM F84.9  THERAPY DIAG:  Motor speech disorder  Pervasive developmental disorder  Social communication disorder  Rationale for Evaluation and Treatment Rehabilitation  SUBJECTIVE:   PERTINENT HISTORY:  Pt is a 37 year old male with pervasive developmental disorder who resides at [facility]. He mentions that he had speech therapy during academic time in [place]. 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   He has history of anxiety.    PAIN:  Are you having pain? No   FALLS: Has patient fallen in last 6 months?  No  LIVING ENVIRONMENT: Lives with: lives with their family and lives in an adult home Lives in: House/apartment  PLOF:  Level of assistance: Needed assistance with ADLs, Needed assistance with IADLS Employment: On disability   PATIENT GOALS      To improve my speaking ability  SUBJECTIVE STATEMENT: Pt eager "I worked on my homework yesterday" Pt accompanied by: his father who attempted the session  OBJECTIVE:    TODAY'S TREATMENT:  Skilled treatment session focused on pt's speech production and social communication goals. SLP facilitated session by providing the following interventions:  Pt had difficulty modulating his pitch throughout the session. While he was very engaged, his vocal pitch was not commensurate with his age and gave the appearance of immaturity. Pt also significantly fidgety when compared to previous sessions. Multiple therapeutic activities were attempted but pt was not able to modulate.   Education shared with pt and his dad on possibility that speech is outward sign of internal stress of being out of routine as he is residing at home this week, reports anxiousness about having a golf less this afternoon. Education provided on keeping a  sense of "normalcy" as per patient when getting prepared for any community based presentation.   PATIENT EDUCATION: Education details: see above Person educated: Patient and his father Education method: Explanation Education comprehension: needs further education   HOME EXERCISE PROGRAM: Write a review  to read in our next session of his golf lesson  GOALS: Goals reviewed with patient? Yes  SHORT TERM GOALS: Target date: 10 sessions  Updated 06/27/2023 With Min A, pt will use speech intelligibility strategies to achieve > 95% intelligibility at the simple paragraph level.  Baseline: Moderate Goal status: INITIAL: MET - goal updated to - With supervision cues, pt will use speech intelligibility strategies to achieve > 95% intelligibility at the simple paragraph level.   2.  With Mod to Min A, the patient will produce multiple sentences placing pauses in appropriate places in 80% of opportunities.  Baseline:  Goal status: INITIAL: MET - goal updated - With rare Min A, the patient will produce multiple sentences placing pauses in appropriate places in 80% of opportunities.   3.  With Mod to Min A, pt will participate in turn-taking with the therapist for 5 turns per opportunity with a minimum of 5 opportunities across 3 data collections. Baseline:  Goal status: INITIAL: MET - goal updated to - With rare Min A, pt will participate in turn-taking with the therapist for 5 turns per opportunity with a minimum of 5 opportunities across 3 data collections.  4.  With Mod to Min A, pt will use age-appropriate vocal characteristics (intonation, volume) for 80% of conversational turns during therapy sessions independently for 3 data collections. Baseline:  Goal status: INITIAL: MET - goal updated to With rare Min A pt will use age-appropriate vocal characteristic (intonation, volume) for 80% of conversation turns during therapy session independently for 3 data collections.    LONG TERM GOALS: Target date: 07/23/2023  Updated 06/27/2023 With Min A, the patient will complete monologues at least 3 minutes long at 80% intelligibility. Baseline:  Goal status: INITIAL: MET - upgraded to With supervision, the patient will complete monologues at least 3 minutes long at 80% intelligibility.   2.  With  supervision, patient will produce multiple sentences placing pauses in appropriate places in 80% of opportunities.  Baseline:  Goal status: INITIAL: progress made  3.  With supervision, pt will use age-appropriate vocal characteristics (intonation, volume) for 80% of conversational turns during therapy sessions independently for 3 data collections. Baseline:  Goal status: INITIAL: MET   ASSESSMENT:  CLINICAL IMPRESSION: Patient is a 37 y.o. male who was seen today for a treatment session to target his speech intelligibility, pragmatic skills in an effort to improve his ability to perform public speaking activities in support of his residence/community, [employer]. He presents with improving communication abilities during structured and unstructured activities.   See the above treatment note for details.   OBJECTIVE IMPAIRMENTS include attention, expressive language, and pragmatic/social communication . These impairments are limiting patient from effectively communicating at home and in community. Factors affecting potential to achieve goals and functional outcome are co-morbidities and previous level of function. Patient will benefit from skilled SLP services to address above impairments and improve overall function.  REHAB POTENTIAL: Good  PLAN: SLP FREQUENCY: 1-2x/week  SLP DURATION: 8 weeks  PLANNED INTERVENTIONS: Functional tasks, SLP instruction and feedback, Compensatory strategies, Patient/family education, and social communication    Amberlee Garvey B. Dreama Saa, M.S., CCC-SLP, CBIS Speech-Language Pathologist Certified Brain Injury Specialist [place]  [facility] Rehabilitation  Services Office [phone] Ascom [phone] Fax [phone]

## 2023-07-08 ENCOUNTER — Ambulatory Visit (INDEPENDENT_AMBULATORY_CARE_PROVIDER_SITE_OTHER): Payer: Self-pay | Admitting: *Deleted

## 2023-07-08 DIAGNOSIS — J309 Allergic rhinitis, unspecified: Secondary | ICD-10-CM | POA: Diagnosis not present

## 2023-07-09 ENCOUNTER — Ambulatory Visit: Payer: Medicare Other | Attending: Psychiatry | Admitting: Speech Pathology

## 2023-07-09 DIAGNOSIS — F8089 Other developmental disorders of speech and language: Secondary | ICD-10-CM | POA: Diagnosis not present

## 2023-07-09 DIAGNOSIS — R4789 Other speech disturbances: Secondary | ICD-10-CM | POA: Insufficient documentation

## 2023-07-09 DIAGNOSIS — F849 Pervasive developmental disorder, unspecified: Secondary | ICD-10-CM | POA: Diagnosis not present

## 2023-07-09 NOTE — Therapy (Signed)
OUTPATIENT SPEECH LANGUAGE PATHOLOGY  TREATMENT NOTE    Patient Name: Francisco Cisneros MRN: [id number] DOB:[birth year], 37 y.o., male Today's Date: 07/09/2023  PCP: Farris Has, MD REFERRING PROVIDER: Lynnell Grain, MD     End of Session - 07/09/23 1437     Visit Number 12    Number of Visits 17    Date for SLP Re-Evaluation 07/23/23    Authorization Type Medicare A/ Medicare Part B    Progress Note Due on Visit 20    SLP Start Time 1400    SLP Stop Time  1445    SLP Time Calculation (min) 45 min    Activity Tolerance Patient tolerated treatment well             No past medical history on file.  The histories are not reviewed yet. Please review them in the "History" navigator section and refresh this SmartLink. 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   Right bundle branch block 02/09/2022   Seasonal and perennial allergic rhinoconjunctivitis 07/10/2021   History of esophagogastroduodenoscopy (EGD) 06/18/2018   Anaphylactic reaction due to food, subsequent encounter 03/19/2017   Atopic dermatitis 03/19/2017   Mild persistent asthma without complication 03/19/2017    ONSET DATE: chronic developmental, date of referral  05/21/2023  REFERRING DIAG: Pervasive developmental disorder ICD-10-CM F84.9  THERAPY DIAG:  Motor speech disorder  Pervasive developmental disorder  Social communication disorder  Rationale for Evaluation and Treatment Rehabilitation  SUBJECTIVE:   PERTINENT HISTORY: Pt  is a 37 year old male with pervasive developmental disorder who resides at [facility]. He mentions that he had speech therapy during academic time in [place]. 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   He has history of anxiety.    PAIN:  Are you having pain? No   FALLS: Has patient fallen in last 6 months?  No  LIVING ENVIRONMENT: Lives with: lives with their family and lives in an adult home Lives in: House/apartment  PLOF:  Level of assistance: Needed assistance with ADLs, Needed assistance with IADLS Employment: On disability   PATIENT GOALS      To improve my speaking ability  SUBJECTIVE STATEMENT: "Thanksgiving was a thumb's down" Pt accompanied by: staff member who stayed in the lobby  OBJECTIVE:    TODAY'S TREATMENT:  Skilled treatment session focused on pt's speech production and social communication goals. SLP facilitated session by providing the following interventions:  Pt had difficulty modulating his pitch throughout the session with repetitive speech observed in the hallway while walking to and from the lobby.   Pt read his homework essay with smooth speech and appropriate pitch as well as volume. Conversational speech was mildly faster in rate but with Min A cues, he was able to improve speech intelligibility. He stated that he was experiencing some emotional anxiety related to the holidays. Education provided on need to rehearse upcoming speeches for [employer]'s open house  PATIENT EDUCATION: Education details: see above Person educated: Patient and his father Education  method: Explanation Education comprehension: needs further education   HOME EXERCISE PROGRAM: Write down some points for upcoming speech during [employer]'s open house  GOALS: Goals reviewed with patient? Yes  SHORT TERM GOALS: Target date: 10 sessions  Updated  06/27/2023 With Min A, pt will use speech intelligibility strategies to achieve > 95% intelligibility at the simple paragraph level.  Baseline: Moderate Goal status: INITIAL: MET - goal updated to - With supervision cues, pt will use speech intelligibility strategies to achieve > 95% intelligibility at the simple paragraph level.   2.  With Mod to Min A, the patient will produce multiple sentences placing pauses in appropriate places in 80% of opportunities.  Baseline:  Goal status: INITIAL: MET - goal updated - With rare Min A, the patient will produce multiple sentences placing pauses in appropriate places in 80% of opportunities.   3.  With Mod to Min A, pt will participate in turn-taking with the therapist for 5 turns per opportunity with a minimum of 5 opportunities across 3 data collections. Baseline:  Goal status: INITIAL: MET - goal updated to - With rare Min A, pt will participate in turn-taking with the therapist for 5 turns per opportunity with a minimum of 5 opportunities across 3 data collections.  4.  With Mod to Min A, pt will use age-appropriate vocal characteristics (intonation, volume) for 80% of conversational turns during therapy sessions independently for 3 data collections. Baseline:  Goal status: INITIAL: MET - goal updated to With rare Min A pt will use age-appropriate vocal characteristic (intonation, volume) for 80% of conversation turns during therapy session independently for 3 data collections.    LONG TERM GOALS: Target date: 07/23/2023  Updated 06/27/2023 With Min A, the patient will complete monologues at least 3 minutes long at 80% intelligibility. Baseline:  Goal status: INITIAL: MET - upgraded to With supervision, the patient will complete monologues at least 3 minutes long at 80% intelligibility.   2.  With supervision, patient will produce multiple sentences placing pauses in appropriate places in 80% of opportunities.  Baseline:  Goal status: INITIAL:  progress made  3.  With supervision, pt will use age-appropriate vocal characteristics (intonation, volume) for 80% of conversational turns during therapy sessions independently for 3 data collections. Baseline:  Goal status: INITIAL: MET   ASSESSMENT:  CLINICAL IMPRESSION: Patient is a 37 y.o. male who was seen today for a treatment session to target his speech intelligibility, pragmatic skills in an effort to improve his ability to perform public speaking activities in support of his residence/community, [employer]. He presents with improving communication abilities during structured and unstructured activities.   See the above treatment note for details.   OBJECTIVE IMPAIRMENTS include attention, expressive language, and pragmatic/social communication . These impairments are limiting patient from effectively communicating at home and in community. Factors affecting potential to achieve goals and functional outcome are co-morbidities and previous level of function. Patient will benefit from skilled SLP services to address above impairments and improve overall function.  REHAB POTENTIAL: Good  PLAN: SLP FREQUENCY: 1-2x/week  SLP DURATION: 8 weeks  PLANNED INTERVENTIONS: Functional tasks, SLP instruction and feedback, Compensatory strategies, Patient/family education, and social communication    [place] B. Dreama Saa, M.S., CCC-SLP, Tree surgeon Certified Brain Injury Specialist [facility]  [facility] Rehabilitation Services Office [phone] Ascom [phone] Fax [phone]

## 2023-07-11 ENCOUNTER — Ambulatory Visit: Payer: Medicare Other | Admitting: Speech Pathology

## 2023-07-11 DIAGNOSIS — F8089 Other developmental disorders of speech and language: Secondary | ICD-10-CM | POA: Diagnosis not present

## 2023-07-11 DIAGNOSIS — R4789 Other speech disturbances: Secondary | ICD-10-CM | POA: Diagnosis not present

## 2023-07-11 DIAGNOSIS — F849 Pervasive developmental disorder, unspecified: Secondary | ICD-10-CM

## 2023-07-11 NOTE — Therapy (Signed)
OUTPATIENT SPEECH LANGUAGE PATHOLOGY  TREATMENT NOTE    Patient Name: Francisco Cisneros MRN: [id number] DOB:[birth year], 37 y.o., male Today's Date: 07/11/2023  PCP: Farris Has, MD REFERRING PROVIDER: Lynnell Grain, MD     End of Session - 07/11/23 1404     Visit Number 13    Number of Visits 17    Date for SLP Re-Evaluation 07/23/23    Authorization Type Medicare A/ Medicare Part B    Progress Note Due on Visit 20    SLP Start Time 1400    SLP Stop Time  1445    SLP Time Calculation (min) 45 min    Activity Tolerance Patient tolerated treatment well             No past medical history on file.  The histories are not reviewed yet. Please review them in the "History" navigator section and refresh this SmartLink. 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   Right bundle branch block 02/09/2022   Seasonal and perennial allergic rhinoconjunctivitis 07/10/2021   History of esophagogastroduodenoscopy (EGD) 06/18/2018   Anaphylactic reaction due to food, subsequent encounter 03/19/2017   Atopic dermatitis 03/19/2017   Mild persistent asthma without complication 03/19/2017    ONSET DATE: chronic developmental, date of referral  05/21/2023  REFERRING DIAG: Pervasive developmental disorder ICD-10-CM F84.9  THERAPY DIAG:  Motor speech disorder  Pervasive developmental disorder  Social communication disorder  Rationale for Evaluation and Treatment Rehabilitation  SUBJECTIVE:   PERTINENT HISTORY: Pt  is a 37 year old male with pervasive developmental disorder who resides at [facility]. He mentions that he had speech therapy during academic time in [place]. 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   He has history of anxiety.    PAIN:  Are you having pain? No   FALLS: Has patient fallen in last 6 months?  No  LIVING ENVIRONMENT: Lives with: lives with their family and lives in an adult home Lives in: House/apartment  PLOF:  Level of assistance: Needed assistance with ADLs, Needed assistance with IADLS Employment: On disability   PATIENT GOALS      To improve my speaking ability  SUBJECTIVE STATEMENT: "He is the email that you need" Pt accompanied by: staff member who stayed in the lobby  OBJECTIVE:    TODAY'S TREATMENT:  Skilled treatment session focused on pt's speech production and social communication goals. SLP facilitated session by providing the following interventions:  Pt arrived with initial written thoughts regarding upcoming speech on reflections of the last 10 years at [facility]. Pt read and made corrections with supervision level cues. Additional details created with minimal assistance.   Throughout the session, pt fluctuated between moments of relaxed appearing speech and moments of faster rate of speech. He voiced several issues that he is experiencing anxiety about. Staff member was helpful in some deep breathing and pt appeared to maintain topic with improved ability when she was present.   PATIENT EDUCATION: Education details: see above Person educated: Patient and  Marchelle Folks [facility] staff member) Education method: Explanation Education comprehension: needs further education   HOME EXERCISE PROGRAM: Create another proof of some points for upcoming speech during [employer]'s open house  GOALS: Goals reviewed with patient? Yes  SHORT TERM  GOALS: Target date: 10 sessions  Updated 06/27/2023 With Min A, pt will use speech intelligibility strategies to achieve > 95% intelligibility at the simple paragraph level.  Baseline: Moderate Goal status: INITIAL: MET - goal updated to - With supervision cues, pt will use speech intelligibility strategies to achieve > 95% intelligibility at the simple paragraph level.   2.  With Mod to Min A, the patient will produce multiple sentences placing pauses in appropriate places in 80% of opportunities.  Baseline:  Goal status: INITIAL: MET - goal updated - With rare Min A, the patient will produce multiple sentences placing pauses in appropriate places in 80% of opportunities.   3.  With Mod to Min A, pt will participate in turn-taking with the therapist for 5 turns per opportunity with a minimum of 5 opportunities across 3 data collections. Baseline:  Goal status: INITIAL: MET - goal updated to - With rare Min A, pt will participate in turn-taking with the therapist for 5 turns per opportunity with a minimum of 5 opportunities across 3 data collections.  4.  With Mod to Min A, pt will use age-appropriate vocal characteristics (intonation, volume) for 80% of conversational turns during therapy sessions independently for 3 data collections. Baseline:  Goal status: INITIAL: MET - goal updated to With rare Min A pt will use age-appropriate vocal characteristic (intonation, volume) for 80% of conversation turns during therapy session independently for 3 data collections.    LONG TERM GOALS: Target date: 07/23/2023  Updated 06/27/2023 With Min A, the patient will complete monologues at least 3 minutes long at 80% intelligibility. Baseline:  Goal status: INITIAL: MET - upgraded to With supervision, the patient will complete monologues at least 3 minutes long at 80% intelligibility.   2.  With supervision, patient will produce multiple sentences placing pauses in appropriate places in 80% of  opportunities.  Baseline:  Goal status: INITIAL: progress made  3.  With supervision, pt will use age-appropriate vocal characteristics (intonation, volume) for 80% of conversational turns during therapy sessions independently for 3 data collections. Baseline:  Goal status: INITIAL: MET   ASSESSMENT:  CLINICAL IMPRESSION: Patient is a 37 y.o. male who was seen today for a treatment session to target his speech intelligibility, pragmatic skills in an effort to improve his ability to perform public speaking activities in support of his residence/community, [employer]. He presents with improving communication abilities during structured and unstructured activities.   See the above treatment note for details.   OBJECTIVE IMPAIRMENTS include attention, expressive language, and pragmatic/social communication . These impairments are limiting patient from effectively communicating at home and in community. Factors affecting potential to achieve goals and functional outcome are co-morbidities and previous level of function. Patient will benefit from skilled SLP services to address above impairments and improve overall function.  REHAB POTENTIAL: Good  PLAN: SLP FREQUENCY: 1-2x/week  SLP DURATION: 8 weeks  PLANNED INTERVENTIONS: Functional tasks, SLP instruction and feedback, Compensatory strategies, Patient/family education, and social communication    Nancyjo Givhan B. Dreama Saa, M.S., CCC-SLP, Tree surgeon Certified Brain Injury Specialist [facility]  [facility] Rehabilitation Services Office [phone] Ascom [phone] Fax [phone]

## 2023-07-16 ENCOUNTER — Ambulatory Visit: Payer: Medicare Other | Admitting: Speech Pathology

## 2023-07-16 DIAGNOSIS — F849 Pervasive developmental disorder, unspecified: Secondary | ICD-10-CM | POA: Diagnosis not present

## 2023-07-16 DIAGNOSIS — R4789 Other speech disturbances: Secondary | ICD-10-CM

## 2023-07-16 DIAGNOSIS — F8089 Other developmental disorders of speech and language: Secondary | ICD-10-CM

## 2023-07-16 NOTE — Therapy (Unsigned)
OUTPATIENT SPEECH LANGUAGE PATHOLOGY  TREATMENT NOTE    Patient Name: Francisco Cisneros MRN: [id number] DOB:1985-12-05, 37 y.o., male Today's Date: 07/16/2023  PCP: Farris Has, MD REFERRING PROVIDER: Lynnell Grain, MD     End of Session - 07/16/23 1552     Visit Number 14    Number of Visits 17    Date for SLP Re-Evaluation 07/23/23    Authorization Type Medicare A/ Medicare Part B    Progress Note Due on Visit 20    SLP Start Time 1400    SLP Stop Time  1445    SLP Time Calculation (min) 45 min    Activity Tolerance Patient tolerated treatment well             No past medical history on file.  The histories are not reviewed yet. Please review them in the "History" navigator section and refresh this SmartLink. 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   Right bundle branch block 02/09/2022   Seasonal and perennial allergic rhinoconjunctivitis 07/10/2021   History of esophagogastroduodenoscopy (EGD) 06/18/2018   Anaphylactic reaction due to food, subsequent encounter 03/19/2017   Atopic dermatitis 03/19/2017   Mild persistent asthma without complication 03/19/2017    ONSET DATE: chronic developmental, date of referral  05/21/2023  REFERRING DIAG: Pervasive developmental disorder ICD-10-CM F84.9  THERAPY DIAG:  Motor speech disorder  Pervasive developmental disorder  Social communication disorder  Rationale for Evaluation and Treatment Rehabilitation  SUBJECTIVE:   PERTINENT HISTORY:  Pt is a 37 year old male with pervasive developmental disorder who resides at [facility]. He mentions that he had speech therapy during academic time in [place]. 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   He has history of anxiety.    PAIN:  Are you having pain? No   FALLS: Has patient fallen in last 6 months?  No  LIVING ENVIRONMENT: Lives with: lives with their family and lives in an adult home Lives in: House/apartment  PLOF:  Level of assistance: Needed assistance with ADLs, Needed assistance with IADLS Employment: On disability   PATIENT GOALS      To improve my speaking ability  SUBJECTIVE STATEMENT: "I had a great visit at [facility] for the group" Pt accompanied by: staff member who attended the session Marchelle Folks)  OBJECTIVE:    TODAY'S TREATMENT:  Skilled treatment session focused on pt's speech production and social communication goals. SLP facilitated session by providing the following interventions:  Pt arrived with final typed draft of his speech reflecting on the last 10 years at [facility]. Pt read passage with good rate of speech and loudness. When reading the speech he noticed some grammatical differences. This Clinical research associate re-typed speech with pt correctly grammar as well as addinging som cohesive details. Multiple copies were printed and pt re-read passage with continued great ability. A copy of the document was sent via email to his staff member Marchelle Folks.    PATIENT EDUCATION: Education details: see above Person educated: Patient and Marchelle Folks Restaurant manager, fast food  staff member) Education method: Explanation Education comprehension: needs further education   HOME EXERCISE PROGRAM: Create another proof of some points for upcoming speech during [employer]'s open house  GOALS: Goals reviewed with patient? Yes  SHORT TERM GOALS: Target date: 10 sessions  Updated  06/27/2023 With Min A, pt will use speech intelligibility strategies to achieve > 95% intelligibility at the simple paragraph level.  Baseline: Moderate Goal status: INITIAL: MET - goal updated to - With supervision cues, pt will use speech intelligibility strategies to achieve > 95% intelligibility at the simple paragraph level.   2.  With Mod to Min A, the patient will produce multiple sentences placing pauses in appropriate places in 80% of opportunities.  Baseline:  Goal status: INITIAL: MET - goal updated - With rare Min A, the patient will produce multiple sentences placing pauses in appropriate places in 80% of opportunities.   3.  With Mod to Min A, pt will participate in turn-taking with the therapist for 5 turns per opportunity with a minimum of 5 opportunities across 3 data collections. Baseline:  Goal status: INITIAL: MET - goal updated to - With rare Min A, pt will participate in turn-taking with the therapist for 5 turns per opportunity with a minimum of 5 opportunities across 3 data collections.  4.  With Mod to Min A, pt will use age-appropriate vocal characteristics (intonation, volume) for 80% of conversational turns during therapy sessions independently for 3 data collections. Baseline:  Goal status: INITIAL: MET - goal updated to With rare Min A pt will use age-appropriate vocal characteristic (intonation, volume) for 80% of conversation turns during therapy session independently for 3 data collections.    LONG TERM GOALS: Target date: 07/23/2023  Updated 06/27/2023 With Min A, the patient will complete monologues at least 3 minutes long at 80% intelligibility. Baseline:  Goal status: INITIAL: MET - upgraded to With supervision, the patient will complete monologues at least 3 minutes long at 80% intelligibility.   2.  With supervision, patient will produce multiple sentences placing pauses in appropriate places in 80% of opportunities.  Baseline:  Goal status: INITIAL:  progress made  3.  With supervision, pt will use age-appropriate vocal characteristics (intonation, volume) for 80% of conversational turns during therapy sessions independently for 3 data collections. Baseline:  Goal status: INITIAL: MET   ASSESSMENT:  CLINICAL IMPRESSION: Patient is a 37 y.o. male who was seen today for a treatment session to target his speech intelligibility, pragmatic skills in an effort to improve his ability to perform public speaking activities in support of his residence/community, [employer]. He presents with improving communication abilities during structured and unstructured activities.   See the above treatment note for details.   OBJECTIVE IMPAIRMENTS include attention, expressive language, and pragmatic/social communication . These impairments are limiting patient from effectively communicating at home and in community. Factors affecting potential to achieve goals and functional outcome are co-morbidities and previous level of function. Patient will benefit from skilled SLP services to address above impairments and improve overall function.  REHAB POTENTIAL: Good  PLAN: SLP FREQUENCY: 1-2x/week  SLP DURATION: 8 weeks  PLANNED INTERVENTIONS: Functional tasks, SLP instruction and feedback, Compensatory strategies, Patient/family education, and social communication    Briggs Edelen B. Dreama Saa, M.S., CCC-SLP, Tree surgeon Certified Brain Injury Specialist [facility]  [facility] Rehabilitation Services Office [phone] Ascom [phone] Fax [phone]

## 2023-07-18 ENCOUNTER — Encounter: Payer: Medicare Other | Admitting: Speech Pathology

## 2023-07-23 ENCOUNTER — Ambulatory Visit: Payer: Medicare Other | Admitting: Speech Pathology

## 2023-07-23 DIAGNOSIS — R4789 Other speech disturbances: Secondary | ICD-10-CM | POA: Diagnosis not present

## 2023-07-23 DIAGNOSIS — F8089 Other developmental disorders of speech and language: Secondary | ICD-10-CM | POA: Diagnosis not present

## 2023-07-23 DIAGNOSIS — F849 Pervasive developmental disorder, unspecified: Secondary | ICD-10-CM | POA: Diagnosis not present

## 2023-07-23 NOTE — Therapy (Signed)
OUTPATIENT SPEECH LANGUAGE PATHOLOGY  TREATMENT NOTE    Patient Name: Francisco Cisneros MRN: [id number] DOB:[birth year], 37 y.o., male Today's Date: 07/23/2023  PCP: Farris Has, MD REFERRING PROVIDER: Lynnell Grain, MD     End of Session - 07/23/23 1059     Visit Number 14    Number of Visits 17    Date for SLP Re-Evaluation 07/23/23    Authorization Type Medicare A/ Medicare Part B    Progress Note Due on Visit 20    SLP Start Time 1100    SLP Stop Time  1145    SLP Time Calculation (min) 45 min    Activity Tolerance Patient tolerated treatment well             No past medical history on file.  The histories are not reviewed yet. Please review them in the "History" navigator section and refresh this SmartLink. 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   Right bundle branch block 02/09/2022   Seasonal and perennial allergic rhinoconjunctivitis 07/10/2021   History of esophagogastroduodenoscopy (EGD) 06/18/2018   Anaphylactic reaction due to food, subsequent encounter 03/19/2017   Atopic dermatitis 03/19/2017   Mild persistent asthma without complication 03/19/2017    ONSET DATE: chronic developmental, date of referral  05/21/2023  REFERRING DIAG: Pervasive developmental disorder ICD-10-CM F84.9  THERAPY DIAG:  Motor speech disorder  Pervasive developmental disorder  Social communication disorder  Rationale for Evaluation and Treatment Rehabilitation  SUBJECTIVE:   PERTINENT HISTORY:  Pt is a 37 year old male with pervasive developmental disorder who resides at [facility]. He mentions that he had speech therapy during academic time in [place]. 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   He has history of anxiety.    PAIN:  Are you having pain? No   FALLS: Has patient fallen in last 6 months?  No  LIVING ENVIRONMENT: Lives with: lives with their family and lives in an adult home Lives in: House/apartment  PLOF:  Level of assistance: Needed assistance with ADLs, Needed assistance with IADLS Employment: On disability   PATIENT GOALS      To improve my speaking ability  SUBJECTIVE STATEMENT: "How can I come see you outside of having therapy?" Pt accompanied by: staff member who attended the session Marchelle Folks)  OBJECTIVE:    TODAY'S TREATMENT:  Skilled treatment session focused on pt's speech production and social communication goals. SLP facilitated session by providing the following interventions:  Pt brought in video of his speech that he gave during this weekend's 10th Anniversary at [facility]. Pt was observed to "ad lib" very well throughout his reading.   During more unstructured conversation starters and trivia questions, pt benefited from baseline reminders for controlling rate of speech and topic maintenance.     PATIENT EDUCATION: Education details: see above Person educated: Patient and Psychologist, prison and probation services member) Education method: Explanation Education comprehension: needs further education   HOME EXERCISE PROGRAM: Continue reading out loud, creating essays for presentations  GOALS: Goals  reviewed with patient? Yes  SHORT TERM GOALS: Target date: 10 sessions  Updated 06/27/2023  Updated 07/23/2023 With Min A, pt will use speech intelligibility strategies to achieve > 95% intelligibility at the simple paragraph level.   Baseline: Moderate Goal status: INITIAL: MET - goal updated to - With supervision cues, pt will use speech intelligibility strategies to achieve > 95% intelligibility at the simple paragraph level. - MET  2.  With Mod to Min A, the patient will produce multiple sentences placing pauses in appropriate places in 80% of opportunities.  Baseline:  Goal status: INITIAL: MET - goal updated - With rare Min A, the patient will produce multiple sentences placing pauses in appropriate places in 80% of opportunities. - MET  3.  With Mod to Min A, pt will participate in turn-taking with the therapist for 5 turns per opportunity with a minimum of 5 opportunities across 3 data collections. Baseline:  Goal status: INITIAL: MET - goal updated to - With rare Min A, pt will participate in turn-taking with the therapist for 5 turns per opportunity with a minimum of 5 opportunities across 3 data collections. - MET  4.  With Mod to Min A, pt will use age-appropriate vocal characteristics (intonation, volume) for 80% of conversational turns during therapy sessions independently for 3 data collections. Baseline:  Goal status: INITIAL: MET - goal updated to With rare Min A pt will use age-appropriate vocal characteristic (intonation, volume) for 80% of conversation turns during therapy session independently for 3 data collections. - MET   LONG TERM GOALS: Target date: 07/23/2023  Updated 06/27/2023  Updated 07/23/2023 With Min A, the patient will complete monologues at least 3 minutes long at 80% intelligibility. Baseline:  Goal status: INITIAL: MET - upgraded to With supervision, the patient will complete monologues at least 3 minutes long at 80% intelligibility. - MET  2.  With supervision, patient will produce multiple sentences placing pauses in appropriate places in 80% of opportunities.  Baseline:  Goal status: INITIAL: progress made - MET  3.  With supervision, pt will use age-appropriate vocal  characteristics (intonation, volume) for 80% of conversational turns during therapy sessions independently for 3 data collections. Baseline:  Goal status: INITIAL: MET   ASSESSMENT:  CLINICAL IMPRESSION: Pt has eagerly attended all ST sessions and he completed all assigned homework tasks. As a result, he has made progress and met all of his STG and LTGs. At this time, all education has been completed on ways to improve topic maintenance and decrease anxiety during presentations.  Pt has successfully completed course of skilled ST.   See the above treatment note for details.    PLAN: Pt is appropriate for discharge from ST services.    Ladye Macnaughton B. Dreama Saa, M.S., CCC-SLP, Tree surgeon Certified Brain Injury Specialist [facility]  [facility] Rehabilitation Services Office [phone] Ascom [phone] Fax [phone]

## 2023-07-24 DIAGNOSIS — F419 Anxiety disorder, unspecified: Secondary | ICD-10-CM | POA: Diagnosis not present

## 2023-07-25 ENCOUNTER — Encounter: Payer: Medicare Other | Admitting: Speech Pathology

## 2023-07-29 ENCOUNTER — Ambulatory Visit: Payer: Medicare Other

## 2023-07-29 ENCOUNTER — Ambulatory Visit (INDEPENDENT_AMBULATORY_CARE_PROVIDER_SITE_OTHER): Payer: Self-pay | Admitting: *Deleted

## 2023-07-29 DIAGNOSIS — J309 Allergic rhinitis, unspecified: Secondary | ICD-10-CM

## 2023-07-29 NOTE — Progress Notes (Signed)
Patient presents today to pick up custom molded foot orthotics, diagnosed with Capsulitis by Dr. Charlsie Merles.   Orthotics were dispensed and fit was satisfactory. Reviewed instructions for break-in and wear. Written instructions given to patient.  Patient will follow up as needed.  Francisco Cisneros CPed CFo, CFm

## 2023-08-01 ENCOUNTER — Encounter: Payer: Medicare Other | Admitting: Speech Pathology

## 2023-08-06 ENCOUNTER — Encounter: Payer: Medicare Other | Admitting: Speech Pathology

## 2023-08-08 ENCOUNTER — Encounter: Payer: Medicare Other | Admitting: Speech Pathology

## 2023-08-12 ENCOUNTER — Ambulatory Visit (INDEPENDENT_AMBULATORY_CARE_PROVIDER_SITE_OTHER): Payer: Medicare Other | Admitting: *Deleted

## 2023-08-12 DIAGNOSIS — J309 Allergic rhinitis, unspecified: Secondary | ICD-10-CM | POA: Diagnosis not present

## 2023-08-13 ENCOUNTER — Encounter: Payer: Medicare Other | Admitting: Speech Pathology

## 2023-08-15 ENCOUNTER — Encounter: Payer: Medicare Other | Admitting: Speech Pathology

## 2023-08-19 ENCOUNTER — Ambulatory Visit (INDEPENDENT_AMBULATORY_CARE_PROVIDER_SITE_OTHER): Payer: Medicare Other

## 2023-08-19 DIAGNOSIS — J309 Allergic rhinitis, unspecified: Secondary | ICD-10-CM | POA: Diagnosis not present

## 2023-08-20 ENCOUNTER — Encounter: Payer: Medicare Other | Admitting: Speech Pathology

## 2023-08-20 DIAGNOSIS — J4520 Mild intermittent asthma, uncomplicated: Secondary | ICD-10-CM | POA: Diagnosis not present

## 2023-08-20 DIAGNOSIS — I4510 Unspecified right bundle-branch block: Secondary | ICD-10-CM | POA: Diagnosis not present

## 2023-08-20 DIAGNOSIS — F411 Generalized anxiety disorder: Secondary | ICD-10-CM | POA: Diagnosis not present

## 2023-08-20 DIAGNOSIS — E785 Hyperlipidemia, unspecified: Secondary | ICD-10-CM | POA: Diagnosis not present

## 2023-08-20 DIAGNOSIS — Z91010 Allergy to peanuts: Secondary | ICD-10-CM | POA: Diagnosis not present

## 2023-08-20 DIAGNOSIS — Z0000 Encounter for general adult medical examination without abnormal findings: Secondary | ICD-10-CM | POA: Diagnosis not present

## 2023-08-20 DIAGNOSIS — L2084 Intrinsic (allergic) eczema: Secondary | ICD-10-CM | POA: Diagnosis not present

## 2023-08-20 DIAGNOSIS — D582 Other hemoglobinopathies: Secondary | ICD-10-CM | POA: Diagnosis not present

## 2023-08-20 DIAGNOSIS — L309 Dermatitis, unspecified: Secondary | ICD-10-CM | POA: Diagnosis not present

## 2023-08-20 DIAGNOSIS — J309 Allergic rhinitis, unspecified: Secondary | ICD-10-CM | POA: Diagnosis not present

## 2023-08-22 ENCOUNTER — Encounter: Payer: Medicare Other | Admitting: Speech Pathology

## 2023-08-26 ENCOUNTER — Ambulatory Visit (INDEPENDENT_AMBULATORY_CARE_PROVIDER_SITE_OTHER): Payer: Self-pay | Admitting: *Deleted

## 2023-08-26 DIAGNOSIS — J309 Allergic rhinitis, unspecified: Secondary | ICD-10-CM

## 2023-08-27 ENCOUNTER — Encounter: Payer: Medicare Other | Admitting: Speech Pathology

## 2023-08-29 ENCOUNTER — Encounter: Payer: Medicare Other | Admitting: Speech Pathology

## 2023-09-03 ENCOUNTER — Encounter: Payer: Medicare Other | Admitting: Speech Pathology

## 2023-09-05 ENCOUNTER — Encounter: Payer: Medicare Other | Admitting: Speech Pathology

## 2023-09-10 ENCOUNTER — Encounter: Payer: Medicare Other | Admitting: Speech Pathology

## 2023-09-12 ENCOUNTER — Encounter: Payer: Medicare Other | Admitting: Speech Pathology

## 2023-09-15 NOTE — Progress Notes (Signed)
 Follow Up Note  RE: Francisco Cisneros MRN: [id number] DOB: 1986/01/24 Date of Office Visit: 09/16/2023  Referring provider: Ronna Coho, MD Primary care provider: Ronna Coho, MD  Chief Complaint: Follow-up (Having a small flare currently. Starting having a cough starting around January 21st, did use mucus relief for a few days.  )  History of Present Illness: I had the pleasure of seeing Francisco Cisneros for a follow up visit at the [facility] on 09/16/2023. He is a 38 y.o. male, who is being followed for allergic rhinoconjunctivitis on AIT, asthma, dermatitis, food allergy . His previous allergy  office visit was on 03/25/2023 with Dr. Burdette Carolin. Today is a regular follow up visit.  He is accompanied today by his mother who provided/contributed to the history.   Discussed the use of AI scribe software for clinical note transcription with the patient, who gave verbal consent to proceed.    He experienced an allergic reaction after consuming food that may have contained oysters or clams, leading to throat discomfort and hives. Despite having an EpiPen , he did not use it during this episode. He often takes risks with food at social events, which sometimes results in eczema flares.   He has a history of eczema, currently experiencing increased itching and redness, particularly this week. He self administer under observation Dupixent injections every two weeks and uses Vanicream lotion regularly. Despite this, the eczema was notably red this week but has improved slightly. He also uses desonide  and eucrisa  which helps.   He receives allergy  shots every four weeks and uses a nasal spray, which he finds helpful. He experienced a cough in January, for which he used an inhaler and mucus relief. He continues to have occasional coughing but did not cough during today's spirometry.      Assessment and Plan: [name] is a 38 y.o. male with: Allergic rhinitis due to animal dander Allergic  rhinitis due to dust mite Allergic rhinitis due to mold Seasonal allergic rhinitis due to pollen Allergic conjunctivitis of both eyes Past history - 2018 bloodwork - IgE 6992.  Positive to dust mites, cat, dog, grass, mold, tree, weed, ragweed. Does not like to use eye drops. 2022 bloodwork positive to dust mites, mold, tree pollen and ragweed pollen.  Borderline positive to weed pollen, grass pollen, cockroach, cat and dog. Started AIT on 04/07/2021 (G-[id number] & M-Dm-C-D). Interim history - AIT is helping. Continue environmental control measures.  Use over the counter antihistamines such as Zyrtec  (cetirizine ), Claritin (loratadine), Allegra (fexofenadine), or Xyzal  (levocetirizine) daily as needed. May take twice a day during allergy  flares. May switch antihistamines every few months. Use Flonase  (fluticasone ) nasal spray 1 spray per nostril 1-2 times a day as needed for nasal congestion.  Nasal saline spray (i.e., Simply Saline) is recommended as needed especially after being outdoors for a prolonged time.  Continue allergy  injections.    Mild persistent asthma without complication Some coughing with recent respiratory infection. Today's spirometry was normal. During respiratory infections/flares:  Start Flovent  44mcg 2 puffs twice a day with spacer and rinse mouth afterwards for 1-2 weeks until your breathing symptoms return to baseline.  Pretreat with albuterol  2 puffs or albuterol  nebulizer.  If you need to use your albuterol  nebulizer machine back to back within 15-30 minutes with no relief then please go to the ER/urgent care for further evaluation.  May use albuterol  rescue inhaler 2 puffs or nebulizer every 4 to 6 hours as needed for shortness of breath, chest tightness,  coughing, and wheezing. May use albuterol  rescue inhaler 2 puffs 5 to 15 minutes prior to strenuous physical activities. Monitor frequency of use - if you need to use it more than twice per week on a consistent basis let  us  know.    Atopic dermatitis Allergic contact dermatitis Past history - managed by derm. 2023 patch testing positive to gold, carba mix, epoxy resin, potassium dichromate, p-tert-butylphenol formaldehyde resin (done by derm). On Dupixent.  Interim history - flaring on his neck and arms. Follow up with dermatology and their recommendations. Avoid items that were positive on patch testing Use vanicream ointment as a moisturizer. Continue Dupixent injections every 2 weeks.  Consider switching biologics - there's a new one that targets IL-31. Continue proper skin care - use fragrance free and dye free shampoo such as vanicream.  May use desonide  0.05% ointment twice a day as needed for mild eczema flares - okay to use on the face, neck, groin area. Do not use more than 1 week at a time. Use Eucrisa  (crisaborole ) 2% ointment twice a day on mild rash flares on the face and body. This is a non-steroid ointment  Anaphylactic reaction due to food Past history - 2018 skin testing positive to almond, hazelnut, coconut. 2021 bloodwork positive to scallop, tree nuts, peanuts and eggs. Borderline positive to coconut, clam, shrimp and lobster. Component panel for the peanuts and tree nuts showed - more likely to have anaphylactic reactions to peanuts, hazelnuts, walnuts, cashew and brazil nuts. 2024 bloodwork positive to tree nuts, peanuts, more likely to have anaphylactic reaction to hazelnuts, walnuts, cashews and [place] nuts. Shellfish and egg borderline positive.  Interim history - reaction to mollusks recently which was treated with benadryl. Continue strict avoidance of peanuts, tree nuts, eggs, shellfish, mollusks (clam, scallop). Okay to eat baked egg items - thinks this flares his eczema. Okay to try coconut. For mild symptoms you can take over the counter antihistamines such as Benadryl 1-2 tablets = 25-50mg  and monitor symptoms closely.  If symptoms worsen or if you have severe symptoms including  breathing issues, throat closure, significant swelling, whole body hives, severe diarrhea and vomiting, lightheadedness then seek immediate medical care. Consider food challenges in the winter months in 2025 or 2026.  Food challenges to consider: stove top eggs first, then shellfish. Recommend peanuts, almond milk last.   Return in about 6 months (around 03/15/2024).  No orders of the defined types were placed in this encounter.  Lab Orders  No laboratory test(s) ordered today    Diagnostics: Spirometry:  Tracings reviewed. His effort: It was hard to get consistent efforts and there is a question as to whether this reflects a maximal maneuver. FVC: 4.12L FEV1: 3.29L, 80% predicted FEV1/FVC ratio: 80% Interpretation: Spirometry consistent with normal pattern.  Please see scanned spirometry results for details. Results discussed with patient/family.   Medication List:  Current Outpatient Medications  Medication Sig Dispense Refill   acetaminophen  (TYLENOL ) 500 MG tablet Take 1,000 mg by mouth every 6 (six) hours as needed for moderate pain.     albuterol  (PROVENTIL ) (2.5 MG/3ML) 0.083% nebulizer solution Take 2.5 mg by nebulization every 6 (six) hours as needed for wheezing or shortness of breath.     albuterol  (VENTOLIN  HFA) 108 (90 Base) MCG/ACT inhaler USE 2 PUFFS BY MOUTH EVERY FOUR HOURS AS NEEDED FOR WHEEZING OR SHORTNESS OF BREATH 18 g 1   Bepotastine Besilate (BEPREVE) 1.5 % SOLN Apply to eye.     cetirizine  (ZYRTEC ) 10  MG tablet Take by mouth.     Crisaborole  (EUCRISA ) 2 % OINT Apply 1 Application topically 2 (two) times daily as needed (mild rash). 60 g 5   desonide  (DESOWEN ) 0.05 % ointment Apply sparingly to affected areas twice daily as needed to the face and/or neck. 15 g 5   dexmethylphenidate (FOCALIN XR) 20 MG 24 hr capsule Take 20 mg by mouth every morning.     dexmethylphenidate (FOCALIN) 10 MG tablet Take 10 mg by mouth daily.     docusate sodium (COLACE) 100 MG  capsule Take 100 mg by mouth daily as needed for mild constipation.     DUPIXENT 300 MG/2ML SOPN Inject into the skin.     EPINEPHrine  0.3 mg/0.3 mL IJ SOAJ injection Inject 0.3 mg into the muscle daily as needed (allergic reaction). 1 each 2   fluticasone  (FLONASE ) 50 MCG/ACT nasal spray Place 2 sprays into both nostrils daily. 16 g 5   Lactobacillus Rhamnosus, GG, (CULTURELLE) CAPS Take 1 capsule by mouth daily.     Lactobacillus-Inulin (CULTURELLE DIGESTIVE HEALTH PO) Take 1 capsule by mouth daily.     levocetirizine (XYZAL ) 5 MG tablet Take 1 tablet (5 mg total) by mouth every evening. 30 tablet 5   LORazepam  (ATIVAN ) 0.5 MG tablet Take 0.5 mg by mouth daily as needed for anxiety.     Omega-3 Fatty Acids (FISH OIL) 645 MG CAPS Take 640 mg by mouth 3 (three) times daily.      Polyethyl Glycol-Propyl Glycol (SYSTANE ULTRA OP) Apply 1 drop to eye daily as needed.     sertraline (ZOLOFT) 50 MG tablet Take 50 mg by mouth daily.     Spacer/Aero-Holding Ismael Maria Use with MDI inhaler 1 each 0   terbinafine (LAMISIL) 1 % cream Apply 1 application  topically 2 (two) times daily.     azelastine  (ASTELIN ) 0.1 % nasal spray Place 1 spray into both nostrils 2 (two) times daily as needed. Use in each nostril as directed (Patient not taking: Reported on 09/16/2023) 30 mL 5   clindamycin (CLINDAGEL) 1 % gel Apply topically 2 (two) times daily. (Patient not taking: Reported on 09/16/2023)     fluticasone  (FLOVENT  HFA) 44 MCG/ACT inhaler Inhale 2 puffs into the lungs 2 (two) times daily. (Patient not taking: Reported on 09/16/2023) 1 each 12   triamcinolone  ointment (KENALOG ) 0.1 % Apply 1 Application topically 2 (two) times daily as needed (rash flare). Do not use on the face, neck, armpits or groin area. Do not use more than 3 weeks in a row. (Patient not taking: Reported on 09/16/2023) 30 g 1   No current facility-administered medications for this visit.   Allergies: Allergies  Allergen Reactions    Egg-Derived Products Anaphylaxis   Other Anaphylaxis    Allergic to ALL NUTS   Peanut  Oil Anaphylaxis   Shellfish Allergy  Anaphylaxis   Coconut (Cocos Nucifera) Rash   Haloperidol  Other (See Comments)    Drowsy for days   Peanut -Containing Drug Products    I reviewed his past medical history, social history, family history, and environmental history and no significant changes have been reported from his previous visit.  Review of Systems  Constitutional:  Negative for appetite change, chills, fever and unexpected weight change.  HENT:  Negative for congestion and rhinorrhea.   Eyes:  Negative for itching.  Respiratory:  Negative for cough, chest tightness, shortness of breath and wheezing.   Gastrointestinal:  Negative for abdominal pain.  Skin:  Positive for rash.  Allergic/Immunologic: Positive for environmental allergies and food allergies.    Objective: BP 126/86 (BP Location: Right Arm, Patient Position: Sitting, Cuff Size: Normal)   Pulse 87   Temp (!) 97.5 F (36.4 C) (Temporal)   Resp 17   Ht 5\' 7"  (1.702 m)   Wt 187 lb 6.4 oz (85 kg)   SpO2 97%   BMI 29.35 kg/m  Body mass index is 29.35 kg/m. Physical Exam Vitals and nursing note reviewed.  Constitutional:      Appearance: He is well-developed.  HENT:     Head: Normocephalic and atraumatic.     Right Ear: Tympanic membrane and external ear normal.     Left Ear: Tympanic membrane and external ear normal.     Nose: Nose normal.     Mouth/Throat:     Mouth: Mucous membranes are moist.     Pharynx: Oropharynx is clear. No oropharyngeal exudate or posterior oropharyngeal erythema.  Eyes:     Conjunctiva/sclera: Conjunctivae normal.  Cardiovascular:     Rate and Rhythm: Normal rate and regular rhythm.     Heart sounds: Normal heart sounds. No murmur heard. Pulmonary:     Effort: Pulmonary effort is normal.     Breath sounds: Normal breath sounds. No wheezing, rhonchi or rales.  Musculoskeletal:     Cervical  back: Neck supple.  Skin:    General: Skin is warm.     Findings: Rash present.     Comments: Large erythematous patches on the upper extremities b/l. Some on the neck as well. Dry periorbital skin.  Neurological:     Mental Status: He is alert. Mental status is at baseline.    Previous notes and tests were reviewed. The plan was reviewed with the patient/family, and all questions/concerned were addressed.  It was my pleasure to see Nickalaus today and participate in his care. Please feel free to contact me with any questions or concerns.  Sincerely,  Eudelia Hero, DO Allergy  & Immunology  [facility] office: [phone] [facility] office: [phone]

## 2023-09-16 ENCOUNTER — Ambulatory Visit (INDEPENDENT_AMBULATORY_CARE_PROVIDER_SITE_OTHER): Payer: Medicare Other | Admitting: Allergy

## 2023-09-16 ENCOUNTER — Other Ambulatory Visit: Payer: Self-pay

## 2023-09-16 ENCOUNTER — Encounter: Payer: Self-pay | Admitting: Allergy

## 2023-09-16 VITALS — BP 126/86 | HR 87 | Temp 97.5°F | Resp 17 | Ht 67.0 in | Wt 187.4 lb

## 2023-09-16 DIAGNOSIS — L2089 Other atopic dermatitis: Secondary | ICD-10-CM

## 2023-09-16 DIAGNOSIS — J4530 Mild persistent asthma, uncomplicated: Secondary | ICD-10-CM

## 2023-09-16 DIAGNOSIS — J3089 Other allergic rhinitis: Secondary | ICD-10-CM

## 2023-09-16 DIAGNOSIS — J301 Allergic rhinitis due to pollen: Secondary | ICD-10-CM

## 2023-09-16 DIAGNOSIS — T7800XD Anaphylactic reaction due to unspecified food, subsequent encounter: Secondary | ICD-10-CM | POA: Diagnosis not present

## 2023-09-16 DIAGNOSIS — H1013 Acute atopic conjunctivitis, bilateral: Secondary | ICD-10-CM | POA: Diagnosis not present

## 2023-09-16 DIAGNOSIS — L2389 Allergic contact dermatitis due to other agents: Secondary | ICD-10-CM

## 2023-09-16 DIAGNOSIS — J3081 Allergic rhinitis due to animal (cat) (dog) hair and dander: Secondary | ICD-10-CM

## 2023-09-16 NOTE — Patient Instructions (Addendum)
 Seasonal and perennial allergic rhinoconjunctivitis 2022 bloodwork positive to dust mites, mold, tree pollen and ragweed pollen.  Borderline positive to weed pollen, grass pollen, cockroach, cat and dog.  Continue environmental control measures.  Use over the counter antihistamines such as Zyrtec  (cetirizine ), Claritin (loratadine), Allegra (fexofenadine), or Xyzal  (levocetirizine) daily as needed. May take twice a day during allergy  flares. May switch antihistamines every few months. Use Flonase  (fluticasone ) nasal spray 1 spray per nostril 1-2 times a day as needed for nasal congestion.  Nasal saline spray (i.e., Simply Saline) is recommended as needed especially after being outdoors for a prolonged time.  Continue allergy  injections.    Food allergy   Continue strict avoidance of peanuts, tree nuts, eggs, shellfish, mollusks (clam, scallop). Okay to eat baked egg items. Okay to try coconut. For mild symptoms you can take over the counter antihistamines such as Benadryl 1-2 tablets = 25-50mg  and monitor symptoms closely.  If symptoms worsen or if you have severe symptoms including breathing issues, throat closure, significant swelling, whole body hives, severe diarrhea and vomiting, lightheadedness then seek immediate medical care.  Consider food challenges in the winter months in 2025 or 2026.  Food challenges to consider: almond milk, shellfish, peanuts, stove top eggs.    Atopic dermatitis Use vanicream ointment as a moisturizer. Follow up with dermatology and their recommendations. 2023 patch testing positive to gold, carba mix, epoxy resin, potassium dichromate, p-tert-butylphenol formaldehyde resin (done by derm).  Avoid items that were positive on patch testing Continue Dupixent injections every 2 weeks.  Consider switching biologics.  Nemluvio - WedMap.com.cy (this targets [place]-31) Continue proper skin care - use fragrance free and dye free shampoo such as vanicream.   May use desonide  0.05% ointment twice a day as needed for mild eczema flares - okay to use on the face, neck, groin area. Do not use more than 1 week at a time. Use Eucrisa  (crisaborole ) 2% ointment twice a day on mild rash flares on the face and body. This is a non-steroid ointment  Asthma  Daily controller medication(s): none.  During respiratory infections/flares:  Start Flovent  44mcg 2 puffs twice a day with spacer and rinse mouth afterwards for 1-2 weeks until your breathing symptoms return to baseline.  Pretreat with albuterol  2 puffs or albuterol  nebulizer.  If you need to use your albuterol  nebulizer machine back to back within 15-30 minutes with no relief then please go to the ER/urgent care for further evaluation.  May use albuterol  rescue inhaler 2 puffs or nebulizer every 4 to 6 hours as needed for shortness of breath, chest tightness, coughing, and wheezing. May use albuterol  rescue inhaler 2 puffs 5 to 15 minutes prior to strenuous physical activities. Monitor frequency of use - if you need to use it more than twice per week on a consistent basis let us  know.  Asthma control goals:  Full participation in all desired activities (may need albuterol  before activity) Albuterol  use two times or less a week on average (not counting use with activity) Cough interfering with sleep two times or less a month Oral steroids no more than once a year No hospitalizations   Follow up in 6 months or sooner if needed.

## 2023-09-17 ENCOUNTER — Encounter: Payer: Medicare Other | Admitting: Speech Pathology

## 2023-09-19 ENCOUNTER — Encounter: Payer: Medicare Other | Admitting: Speech Pathology

## 2023-09-23 ENCOUNTER — Ambulatory Visit (INDEPENDENT_AMBULATORY_CARE_PROVIDER_SITE_OTHER): Payer: Self-pay

## 2023-09-23 DIAGNOSIS — J309 Allergic rhinitis, unspecified: Secondary | ICD-10-CM

## 2023-10-17 DIAGNOSIS — F419 Anxiety disorder, unspecified: Secondary | ICD-10-CM | POA: Diagnosis not present

## 2023-10-21 ENCOUNTER — Ambulatory Visit (INDEPENDENT_AMBULATORY_CARE_PROVIDER_SITE_OTHER): Payer: Self-pay | Admitting: *Deleted

## 2023-10-21 DIAGNOSIS — J309 Allergic rhinitis, unspecified: Secondary | ICD-10-CM

## 2023-10-30 ENCOUNTER — Telehealth: Payer: Self-pay

## 2023-10-30 DIAGNOSIS — M79673 Pain in unspecified foot: Secondary | ICD-10-CM

## 2023-10-30 NOTE — Telephone Encounter (Signed)
 Spoke with mother, ordering a 2nd pair of orthotics, for half off $271 will call when they come in.

## 2023-10-30 NOTE — Telephone Encounter (Signed)
 Order placed with Laurence Ferrari prior invoice  [phone]

## 2023-11-07 ENCOUNTER — Telehealth: Payer: Self-pay

## 2023-11-07 NOTE — Telephone Encounter (Signed)
 ORTHOTICS ARE IN, LEFT VM TO PICK UP 2ND PAIR OF ORTHOS. WILL HAVE TO PAY $271

## 2023-11-18 ENCOUNTER — Ambulatory Visit (INDEPENDENT_AMBULATORY_CARE_PROVIDER_SITE_OTHER): Payer: Self-pay

## 2023-11-18 DIAGNOSIS — J309 Allergic rhinitis, unspecified: Secondary | ICD-10-CM | POA: Diagnosis not present

## 2023-11-19 ENCOUNTER — Telehealth: Payer: Self-pay | Admitting: Podiatry

## 2023-11-19 DIAGNOSIS — L2089 Other atopic dermatitis: Secondary | ICD-10-CM | POA: Diagnosis not present

## 2023-11-19 NOTE — Telephone Encounter (Signed)
error 

## 2023-11-19 NOTE — Telephone Encounter (Signed)
 Pt got 1 pair of shoes in Dec and 2nd pair about 2 wks ago. Father says they are hard to put into shoe and then they buckley. May just need a minor trim. Wanting to see if you can call him if you can work them in w/o having to book them into June.

## 2023-11-20 ENCOUNTER — Ambulatory Visit

## 2023-11-20 NOTE — Progress Notes (Signed)
 Patient and Father were here I ground out arch of 1st pair of orthotics making more flexible  Patient will try and dad will call back and let me know If I need to re-make 2nd pair in accommodative material   Britton Cane Cped, CFo, CFm

## 2023-12-13 DIAGNOSIS — H109 Unspecified conjunctivitis: Secondary | ICD-10-CM | POA: Diagnosis not present

## 2023-12-16 DIAGNOSIS — L2089 Other atopic dermatitis: Secondary | ICD-10-CM | POA: Diagnosis not present

## 2023-12-16 DIAGNOSIS — Z5181 Encounter for therapeutic drug level monitoring: Secondary | ICD-10-CM | POA: Diagnosis not present

## 2023-12-23 ENCOUNTER — Ambulatory Visit (INDEPENDENT_AMBULATORY_CARE_PROVIDER_SITE_OTHER): Payer: Self-pay

## 2023-12-23 DIAGNOSIS — J309 Allergic rhinitis, unspecified: Secondary | ICD-10-CM

## 2023-12-31 ENCOUNTER — Other Ambulatory Visit: Payer: Self-pay | Admitting: Allergy

## 2024-01-11 DIAGNOSIS — H109 Unspecified conjunctivitis: Secondary | ICD-10-CM | POA: Diagnosis not present

## 2024-01-16 DIAGNOSIS — F419 Anxiety disorder, unspecified: Secondary | ICD-10-CM | POA: Diagnosis not present

## 2024-01-20 ENCOUNTER — Ambulatory Visit (INDEPENDENT_AMBULATORY_CARE_PROVIDER_SITE_OTHER): Payer: Self-pay

## 2024-01-20 DIAGNOSIS — J309 Allergic rhinitis, unspecified: Secondary | ICD-10-CM | POA: Diagnosis not present

## 2024-01-31 DIAGNOSIS — H10412 Chronic giant papillary conjunctivitis, left eye: Secondary | ICD-10-CM | POA: Diagnosis not present

## 2024-01-31 DIAGNOSIS — H01112 Allergic dermatitis of right lower eyelid: Secondary | ICD-10-CM | POA: Diagnosis not present

## 2024-01-31 DIAGNOSIS — H01111 Allergic dermatitis of right upper eyelid: Secondary | ICD-10-CM | POA: Diagnosis not present

## 2024-01-31 DIAGNOSIS — H01114 Allergic dermatitis of left upper eyelid: Secondary | ICD-10-CM | POA: Diagnosis not present

## 2024-01-31 DIAGNOSIS — H01115 Allergic dermatitis of left lower eyelid: Secondary | ICD-10-CM | POA: Diagnosis not present

## 2024-02-04 DIAGNOSIS — Z5181 Encounter for therapeutic drug level monitoring: Secondary | ICD-10-CM | POA: Diagnosis not present

## 2024-02-04 DIAGNOSIS — L2089 Other atopic dermatitis: Secondary | ICD-10-CM | POA: Diagnosis not present

## 2024-02-13 DIAGNOSIS — H10503 Unspecified blepharoconjunctivitis, bilateral: Secondary | ICD-10-CM | POA: Diagnosis not present

## 2024-02-14 ENCOUNTER — Telehealth: Payer: Self-pay | Admitting: Allergy

## 2024-02-14 NOTE — Telephone Encounter (Signed)
 Please call patient to clarify what kind of reaction he had to nemluvio?  I'm not managing his eczema and his dermatologist is doing it. So if they want to switch him to oral medications that is okay with me.

## 2024-02-14 NOTE — Telephone Encounter (Signed)
 PT's mom called to f/u on phone call from 3 days ago, regarding information on injection Rxs and wanting to not attend anticipated injection on Monday, was expecting a phone call back from clinical staff to review/discuss. Dupixent, Numovio (just had allergic reaction), and Dr. Robinson put him on Rinvoq. Can call back on [phone] and wanted to make sure that Dr. Luke was aware

## 2024-02-17 NOTE — Telephone Encounter (Signed)
 I called and left a message on the patient's father's phone to call the office back regarding Dr. Mariella previous note.

## 2024-02-17 NOTE — Telephone Encounter (Signed)
 Patient started Rinvoq 2 weeks ago and has been doing good no issues. He will be coming in for his allergy  injection on the 28th. They are out of town for a [place]  trip and did not want to have the allergy  shot too close to is Rinvoq medication.

## 2024-02-18 DIAGNOSIS — L2084 Intrinsic (allergic) eczema: Secondary | ICD-10-CM | POA: Diagnosis not present

## 2024-02-18 DIAGNOSIS — Z6828 Body mass index (BMI) 28.0-28.9, adult: Secondary | ICD-10-CM | POA: Diagnosis not present

## 2024-02-18 DIAGNOSIS — J4520 Mild intermittent asthma, uncomplicated: Secondary | ICD-10-CM | POA: Diagnosis not present

## 2024-02-18 DIAGNOSIS — F411 Generalized anxiety disorder: Secondary | ICD-10-CM | POA: Diagnosis not present

## 2024-02-18 DIAGNOSIS — E785 Hyperlipidemia, unspecified: Secondary | ICD-10-CM | POA: Diagnosis not present

## 2024-02-18 DIAGNOSIS — R7989 Other specified abnormal findings of blood chemistry: Secondary | ICD-10-CM | POA: Diagnosis not present

## 2024-02-18 DIAGNOSIS — D582 Other hemoglobinopathies: Secondary | ICD-10-CM | POA: Diagnosis not present

## 2024-03-05 DIAGNOSIS — J301 Allergic rhinitis due to pollen: Secondary | ICD-10-CM | POA: Diagnosis not present

## 2024-03-05 NOTE — Progress Notes (Signed)
 VIALS MADE ON 03/05/24

## 2024-03-06 DIAGNOSIS — J3089 Other allergic rhinitis: Secondary | ICD-10-CM | POA: Diagnosis not present

## 2024-03-09 ENCOUNTER — Ambulatory Visit (INDEPENDENT_AMBULATORY_CARE_PROVIDER_SITE_OTHER)

## 2024-03-09 DIAGNOSIS — J309 Allergic rhinitis, unspecified: Secondary | ICD-10-CM

## 2024-03-10 DIAGNOSIS — B353 Tinea pedis: Secondary | ICD-10-CM | POA: Diagnosis not present

## 2024-03-10 DIAGNOSIS — Z5181 Encounter for therapeutic drug level monitoring: Secondary | ICD-10-CM | POA: Diagnosis not present

## 2024-03-10 DIAGNOSIS — L2089 Other atopic dermatitis: Secondary | ICD-10-CM | POA: Diagnosis not present

## 2024-03-16 ENCOUNTER — Encounter: Payer: Self-pay | Admitting: Allergy

## 2024-03-16 ENCOUNTER — Ambulatory Visit (INDEPENDENT_AMBULATORY_CARE_PROVIDER_SITE_OTHER): Payer: Medicare Other | Admitting: Allergy

## 2024-03-16 ENCOUNTER — Other Ambulatory Visit: Payer: Self-pay

## 2024-03-16 VITALS — BP 110/88 | HR 92 | Temp 98.1°F | Resp 18 | Ht 68.0 in | Wt 181.6 lb

## 2024-03-16 DIAGNOSIS — J4530 Mild persistent asthma, uncomplicated: Secondary | ICD-10-CM

## 2024-03-16 DIAGNOSIS — J3081 Allergic rhinitis due to animal (cat) (dog) hair and dander: Secondary | ICD-10-CM

## 2024-03-16 DIAGNOSIS — J3089 Other allergic rhinitis: Secondary | ICD-10-CM | POA: Diagnosis not present

## 2024-03-16 DIAGNOSIS — H1013 Acute atopic conjunctivitis, bilateral: Secondary | ICD-10-CM

## 2024-03-16 DIAGNOSIS — T7800XD Anaphylactic reaction due to unspecified food, subsequent encounter: Secondary | ICD-10-CM

## 2024-03-16 DIAGNOSIS — L2089 Other atopic dermatitis: Secondary | ICD-10-CM

## 2024-03-16 DIAGNOSIS — L2389 Allergic contact dermatitis due to other agents: Secondary | ICD-10-CM

## 2024-03-16 DIAGNOSIS — B353 Tinea pedis: Secondary | ICD-10-CM

## 2024-03-16 DIAGNOSIS — J301 Allergic rhinitis due to pollen: Secondary | ICD-10-CM | POA: Diagnosis not present

## 2024-03-16 NOTE — Patient Instructions (Addendum)
 Seasonal and perennial allergic rhinoconjunctivitis 2022 bloodwork positive to dust mites, mold, tree pollen and ragweed pollen. Borderline positive to weed pollen, grass pollen, cockroach, cat and dog.  Continue environmental control measures.  Use over the counter antihistamines such as Zyrtec  (cetirizine ), Claritin (loratadine), Allegra (fexofenadine), or Xyzal  (levocetirizine) daily as needed. May take twice a day during allergy  flares. May switch antihistamines every few months. Use Flonase  (fluticasone ) nasal spray 1 spray per nostril 1-2 times a day as needed for nasal congestion.  Nasal saline spray (i.e., Simply Saline) is recommended as needed especially after being outdoors for a prolonged time.  Continue allergy  injections.    Food allergy   Continue strict avoidance of peanuts, tree nuts, eggs, shellfish, mollusks (clam, scallop). Okay to eat baked egg items. Okay to try coconut. For mild symptoms you can take over the counter antihistamines (zyrtec  10mg  to 20mg ) and monitor symptoms closely.  If symptoms worsen or if you have severe symptoms including breathing issues, throat closure, significant swelling, whole body hives, severe diarrhea and vomiting, lightheadedness then use epinephrine  and seek immediate medical care afterwards. Emergency action plan in place.   Consider food challenges in the winter months in 2025 or 2026.  Food challenge to eggs first, shellfish next.   Food challenge instructions: You must be off antihistamines for 3-5 days before. Must be in good health and not ill. No vaccines/injections/antibiotics within the past 7 days.  Plan on being in the office for 2-3 hours and must bring in the food you want to do the oral challenge for - 2 scrambled eggs.  You must call to schedule an appointment and specify it's for a food challenge.    Atopic dermatitis 2023 patch testing positive to gold, carba mix, epoxy resin, potassium dichromate, p-tert-butylphenol  formaldehyde resin (done by derm). Avoid items that were positive on patch testing Continue Rinvoq as per dermatology.  Continue proper skin care - use fragrance free and dye free shampoo such as vanicream.  May use desonide  0.05% ointment twice a day as needed for mild eczema flares - okay to use on the face, neck, groin area. Do not use more than 1 week at a time.  Asthma  Breathing test looked fine today.  You can buy disposable spacers online to use as needed. During respiratory infections/flares:  Start Flovent  44mcg 2 puffs twice a day with spacer and rinse mouth afterwards for 1-2 weeks until your breathing symptoms return to baseline.  Pretreat with albuterol  2 puffs or albuterol  nebulizer.  If you need to use your albuterol  nebulizer machine back to back within 15-30 minutes with no relief then please go to the ER/urgent care for further evaluation.  May use albuterol  rescue inhaler 2 puffs or nebulizer every 4 to 6 hours as needed for shortness of breath, chest tightness, coughing, and wheezing. May use albuterol  rescue inhaler 2 puffs 5 to 15 minutes prior to strenuous physical activities. Monitor frequency of use - if you need to use it more than twice per week on a consistent basis let us  know.  Asthma control goals:  Full participation in all desired activities (may need albuterol  before activity) Albuterol  use two times or less a week on average (not counting use with activity) Cough interfering with sleep two times or less a month Oral steroids no more than once a year No hospitalizations   Athlete's foot See handout. Mention to your dermatologist as well.  Follow up in 6 months or sooner if needed.  Follow up  with the eye doctor.  Okay to get flu shot and covid-19 booster. Wait 48 hours between vaccines and allergy  shots.

## 2024-03-16 NOTE — Progress Notes (Signed)
 Follow Up Note  RE: Francisco Cisneros MRN: [id number] DOB: 28-Feb-1986 Date of Office Visit: 03/16/2024  Referring provider: Kip Righter, MD Primary care provider: Kip Righter, MD  Chief Complaint: Follow-up (Pt presents to the office with dad and mom. Pt started Rinvoq 5 weeks ago and pt states that he is doing very well with the new medication.)  History of Present Illness: I had the pleasure of seeing Francisco Cisneros for a follow up visit at the [facility] on 03/16/2024. He is a 38 y.o. male, who is being followed for allergic rhinoconjunctivitis on AIT, asthma, dermatitis, food allergy . His previous allergy  office visit was on 09/16/2023 with Dr. Luke. Today is a regular follow up visit. He is accompanied today by his mother and father who provided/contributed to the history.   Discussed the use of AI scribe software for clinical note transcription with the patient, who gave verbal consent to proceed.    He recently transitioned from Dupixent to Nemluvio and then to Rinvoq due to a suspected allergic reaction to [facility]. He has been on Rinvoq for about five to six weeks with positive results. He uses desonide  for flare-ups on his arms and neck and Vanicream for general moisturizing. He avoids certain foods like eggs and coconut due to allergies, though he can tolerate small amounts of eggs in baked goods. He plans to undergo food challenges for eggs and possibly other allergens in the coming months.  He has a history of eye infections and pink eye, which have been stable recently. However, he experiences persistent teary eyes and is scheduled to see an eye doctor next week.  He is also managing high cholesterol and is working on weight loss, having reduced from 190 to 181 pounds.  He receives allergy  shots, which have been beneficial in reducing the frequency of his visits for allergy -related issues. He continues to use allergy  pills and Flonase  as needed, particularly  at night.  He has asthma and uses an inhaler occasionally, especially when sick or experiencing coughing fits. He finds it challenging to use a spacer at his current living situation but manages to use the inhaler effectively without it.  He is managing athlete's foot on his left foot with medication and takes measures to keep his feet dry and clean, especially with upcoming changes to his shower setup to prevent water accumulation.     Assessment and Plan: Francisco Cisneros is a 38 y.o. male with: Allergic rhinitis due to animal dander Allergic rhinitis due to dust mite Allergic rhinitis due to mold Seasonal allergic rhinitis due to pollen Allergic conjunctivitis of both eyes Past history - 2018 bloodwork - IgE 6992.  Positive to dust mites, cat, dog, grass, mold, tree, weed, ragweed. Does not like to use eye drops. 2022 bloodwork positive to dust mites, mold, tree pollen and ragweed pollen.  Borderline positive to weed pollen, grass pollen, cockroach, cat and dog. Started AIT on 04/07/2021 (G-Rw-W-T & M-Dm-C-D). Interim history - AIT is helping and having less flares with the allergies.  Continue environmental control measures.  Use over the counter antihistamines such as Zyrtec  (cetirizine ), Claritin (loratadine), Allegra (fexofenadine), or Xyzal  (levocetirizine) daily as needed. May take twice a day during allergy  flares. May switch antihistamines every few months. Use Flonase  (fluticasone ) nasal spray 1 spray per nostril 1-2 times a day as needed for nasal congestion.  Nasal saline spray (i.e., Simply Saline) is recommended as needed especially after being outdoors for a prolonged time.  Continue allergy   injections - given today.    Mild persistent asthma without complication Rare albuterol  use.  Today's spirometry was unremarkable. You can buy disposable spacers online to use as needed. During respiratory infections/flares:  Start Flovent  44mcg 2 puffs twice a day with spacer and rinse mouth  afterwards for 1-2 weeks until your breathing symptoms return to baseline.  Pretreat with albuterol  2 puffs or albuterol  nebulizer.  If you need to use your albuterol  nebulizer machine back to back within 15-30 minutes with no relief then please go to the ER/urgent care for further evaluation.  May use albuterol  rescue inhaler 2 puffs or nebulizer every 4 to 6 hours as needed for shortness of breath, chest tightness, coughing, and wheezing. May use albuterol  rescue inhaler 2 puffs 5 to 15 minutes prior to strenuous physical activities. Monitor frequency of use - if you need to use it more than twice per week on a consistent basis let us  know.    Atopic dermatitis Allergic contact dermatitis Past history - managed by derm. 2023 patch testing positive to gold, carba mix, epoxy resin, potassium dichromate, p-tert-butylphenol formaldehyde resin (done by derm). Failed Dupixent.  Interim history - flaring on his neck and arms. Had a reaction to Nemluvio and now on rinvoq - managed by dermatology. Follow up with dermatology and their recommendations. Avoid items that were positive on patch testing. Continue Rinvoq as per dermatology.  Continue proper skin care - use fragrance free and dye free shampoo such as vanicream.  May use desonide  0.05% ointment twice a day as needed for mild eczema flares - okay to use on the face, neck, groin area. Do not use more than 1 week at a time.   Anaphylactic reaction due to food Past history - 2018 skin testing positive to almond, hazelnut, coconut. 2021 bloodwork positive to scallop, tree nuts, peanuts and eggs. Borderline positive to coconut, clam, shrimp and lobster. Component panel for the peanuts and tree nuts showed - more likely to have anaphylactic reactions to peanuts, hazelnuts, walnuts, cashew and brazil nuts. 2024 bloodwork positive to tree nuts, peanuts, more likely to have anaphylactic reaction to hazelnuts, walnuts, cashews and [place] nuts. Shellfish and  egg borderline positive. Reaction to mollusks recently which was treated with benadryl. Interim history - increased baked egg flares eczema? Continue strict avoidance of peanuts, tree nuts, eggs, shellfish, mollusks (clam, scallop). Okay to eat baked egg items. Okay to try coconut. For mild symptoms you can take over the counter antihistamines (zyrtec  10mg  to 20mg ) and monitor symptoms closely.  If symptoms worsen or if you have severe symptoms including breathing issues, throat closure, significant swelling, whole body hives, severe diarrhea and vomiting, lightheadedness then use epinephrine  and seek immediate medical care afterwards. Emergency action plan in place.  Consider food challenges in the winter months in 2025 or 2026.  Food challenge to eggs first, shellfish next (shrimp?) Will avoid tree nuts and peanuts due to cross reaction and anaphylaxis risks.   Athlete's foot See handout. Mention to your dermatologist as well.  Return in about 6 months (around 09/16/2024).  No orders of the defined types were placed in this encounter.  Lab Orders  No laboratory test(s) ordered today    Diagnostics: Spirometry:  Tracings reviewed. His effort: It was hard to get consistent efforts and there is a question as to whether this reflects a maximal maneuver. FVC: 4.39L FEV1: 3.50L, 86% predicted FEV1/FVC ratio: 80% Interpretation: No overt abnormalities noted given today's efforts.  Please see scanned spirometry results  for details.  Results discussed with patient/family.   Medication List:  Current Outpatient Medications  Medication Sig Dispense Refill   acetaminophen  (TYLENOL ) 500 MG tablet Take 1,000 mg by mouth every 6 (six) hours as needed for moderate pain.     albuterol  (PROVENTIL ) (2.5 MG/3ML) 0.083% nebulizer solution Take 2.5 mg by nebulization every 6 (six) hours as needed for wheezing or shortness of breath.     albuterol  (VENTOLIN  HFA) 108 (90 Base) MCG/ACT inhaler USE 2  PUFFS BY MOUTH EVERY FOUR HOURS AS NEEDED FOR WHEEZING OR SHORTNESS OF BREATH 8.5 g 1   azelastine  (ASTELIN ) 0.1 % nasal spray Place 1 spray into both nostrils 2 (two) times daily as needed. Use in each nostril as directed 30 mL 5   cetirizine  (ZYRTEC ) 10 MG tablet Take by mouth.     desonide  (DESOWEN ) 0.05 % ointment Apply sparingly to affected areas twice daily as needed to the face and/or neck. 15 g 5   dexmethylphenidate (FOCALIN XR) 20 MG 24 hr capsule Take 20 mg by mouth every morning.     dexmethylphenidate (FOCALIN) 10 MG tablet Take 10 mg by mouth daily.     docusate sodium (COLACE) 100 MG capsule Take 100 mg by mouth daily as needed for mild constipation.     EPINEPHrine  0.3 mg/0.3 mL IJ SOAJ injection Inject 0.3 mg into the muscle daily as needed (allergic reaction). 1 each 2   fluticasone  (FLONASE ) 50 MCG/ACT nasal spray Place 2 sprays into both nostrils daily. 16 g 5   Lactobacillus Rhamnosus, GG, (CULTURELLE) CAPS Take 1 capsule by mouth daily.     Lactobacillus-Inulin (CULTURELLE DIGESTIVE HEALTH PO) Take 1 capsule by mouth daily.     levocetirizine (XYZAL ) 5 MG tablet Take 1 tablet (5 mg total) by mouth every evening. 30 tablet 5   LORazepam  (ATIVAN ) 0.5 MG tablet Take 0.5 mg by mouth daily as needed for anxiety.     Omega-3 Fatty Acids (FISH OIL) 645 MG CAPS Take 640 mg by mouth 3 (three) times daily.      Polyethyl Glycol-Propyl Glycol (SYSTANE ULTRA OP) Apply 1 drop to eye daily as needed.     RINVOQ 15 MG TB24 Take 1 tablet by mouth daily.     sertraline (ZOLOFT) 50 MG tablet Take 50 mg by mouth daily.     Spacer/Aero-Holding Raguel FRENCH Use with MDI inhaler 1 each 0   terbinafine (LAMISIL) 1 % cream Apply 1 application  topically 2 (two) times daily.     triamcinolone  ointment (KENALOG ) 0.1 % Apply 1 Application topically 2 (two) times daily as needed (rash flare). Do not use on the face, neck, armpits or groin area. Do not use more than 3 weeks in a row. 30 g 1    fluticasone  (FLOVENT  HFA) 44 MCG/ACT inhaler Inhale 2 puffs into the lungs 2 (two) times daily. (Patient not taking: Reported on 03/16/2024) 1 each 12   No current facility-administered medications for this visit.   Allergies: Allergies  Allergen Reactions   Egg-Derived Products Anaphylaxis   Other Anaphylaxis    Allergic to ALL NUTS   Peanut  Oil Anaphylaxis   Shellfish Allergy  Anaphylaxis   Coconut (Cocos Nucifera) Rash   Haloperidol  Other (See Comments)    Drowsy for days   Peanut -Containing Drug Products    I reviewed his past medical history, social history, family history, and environmental history and no significant changes have been reported from his previous visit.  Review of Systems  Constitutional:  Negative for appetite change, chills, fever and unexpected weight change.  HENT:  Negative for congestion and rhinorrhea.   Eyes:  Positive for discharge.  Respiratory:  Negative for cough, chest tightness, shortness of breath and wheezing.   Gastrointestinal:  Negative for abdominal pain.  Skin:  Positive for rash.  Allergic/Immunologic: Positive for environmental allergies and food allergies.    Objective: BP 110/88 (BP Location: Right Arm, Patient Position: Sitting, Cuff Size: Normal)   Pulse 92   Temp 98.1 F (36.7 C) (Temporal)   Resp 18   Ht 5' 8 (1.727 m)   Wt 181 lb 9.6 oz (82.4 kg)   SpO2 97%   BMI 27.61 kg/m  Body mass index is 27.61 kg/m. Physical Exam Vitals and nursing note reviewed.  Constitutional:      Appearance: He is well-developed.  HENT:     Head: Normocephalic and atraumatic.     Right Ear: Tympanic membrane and external ear normal.     Left Ear: Tympanic membrane and external ear normal.     Nose: Nose normal.     Mouth/Throat:     Mouth: Mucous membranes are moist.     Pharynx: Oropharynx is clear. No oropharyngeal exudate or posterior oropharyngeal erythema.  Eyes:     Conjunctiva/sclera: Conjunctivae normal.  Cardiovascular:      Rate and Rhythm: Normal rate and regular rhythm.     Heart sounds: Normal heart sounds. No murmur heard. Pulmonary:     Effort: Pulmonary effort is normal.     Breath sounds: Normal breath sounds. No wheezing, rhonchi or rales.  Musculoskeletal:     Cervical back: Neck supple.  Skin:    General: Skin is warm and dry.     Findings: Rash present.     Comments: Some dry skin on the neck and periorbitally b/l. Left arm - small area of erythema with excoriation mark. Eczematous patch on right antecubital fossa area.   Neurological:     Mental Status: He is alert. Mental status is at baseline.    Previous notes and tests were reviewed. The plan was reviewed with the patient/family, and all questions/concerned were addressed.  It was my pleasure to see Silvano today and participate in his care. Please feel free to contact me with any questions or concerns.  Sincerely,  Orlan Cramp, DO Allergy  & Immunology  [facility]  [facility] office: [phone] [facility] office: [phone]

## 2024-03-23 ENCOUNTER — Ambulatory Visit (INDEPENDENT_AMBULATORY_CARE_PROVIDER_SITE_OTHER)

## 2024-03-23 DIAGNOSIS — J309 Allergic rhinitis, unspecified: Secondary | ICD-10-CM | POA: Diagnosis not present

## 2024-04-01 DIAGNOSIS — H5213 Myopia, bilateral: Secondary | ICD-10-CM | POA: Diagnosis not present

## 2024-04-01 DIAGNOSIS — H10403 Unspecified chronic conjunctivitis, bilateral: Secondary | ICD-10-CM | POA: Diagnosis not present

## 2024-04-07 DIAGNOSIS — E785 Hyperlipidemia, unspecified: Secondary | ICD-10-CM | POA: Diagnosis not present

## 2024-04-07 DIAGNOSIS — D751 Secondary polycythemia: Secondary | ICD-10-CM | POA: Diagnosis not present

## 2024-04-07 DIAGNOSIS — R7989 Other specified abnormal findings of blood chemistry: Secondary | ICD-10-CM | POA: Diagnosis not present

## 2024-04-15 DIAGNOSIS — F419 Anxiety disorder, unspecified: Secondary | ICD-10-CM | POA: Diagnosis not present

## 2024-04-20 ENCOUNTER — Ambulatory Visit (INDEPENDENT_AMBULATORY_CARE_PROVIDER_SITE_OTHER)

## 2024-04-20 DIAGNOSIS — J309 Allergic rhinitis, unspecified: Secondary | ICD-10-CM

## 2024-05-18 ENCOUNTER — Ambulatory Visit

## 2024-05-18 DIAGNOSIS — J309 Allergic rhinitis, unspecified: Secondary | ICD-10-CM | POA: Diagnosis not present

## 2024-05-25 ENCOUNTER — Ambulatory Visit (INDEPENDENT_AMBULATORY_CARE_PROVIDER_SITE_OTHER)

## 2024-05-25 DIAGNOSIS — J309 Allergic rhinitis, unspecified: Secondary | ICD-10-CM | POA: Diagnosis not present

## 2024-06-01 ENCOUNTER — Ambulatory Visit

## 2024-06-01 DIAGNOSIS — J309 Allergic rhinitis, unspecified: Secondary | ICD-10-CM | POA: Diagnosis not present

## 2024-06-09 DIAGNOSIS — L2089 Other atopic dermatitis: Secondary | ICD-10-CM | POA: Diagnosis not present

## 2024-06-09 DIAGNOSIS — Z5181 Encounter for therapeutic drug level monitoring: Secondary | ICD-10-CM | POA: Diagnosis not present

## 2024-06-15 ENCOUNTER — Encounter: Admitting: Internal Medicine

## 2024-06-16 ENCOUNTER — Encounter: Payer: Self-pay | Admitting: Internal Medicine

## 2024-06-16 ENCOUNTER — Other Ambulatory Visit: Payer: Self-pay

## 2024-06-16 ENCOUNTER — Ambulatory Visit (INDEPENDENT_AMBULATORY_CARE_PROVIDER_SITE_OTHER): Admitting: Internal Medicine

## 2024-06-16 VITALS — BP 122/88 | HR 100 | Temp 98.1°F | Wt 185.8 lb

## 2024-06-16 DIAGNOSIS — J4530 Mild persistent asthma, uncomplicated: Secondary | ICD-10-CM | POA: Diagnosis not present

## 2024-06-16 DIAGNOSIS — J069 Acute upper respiratory infection, unspecified: Secondary | ICD-10-CM

## 2024-06-16 DIAGNOSIS — J302 Other seasonal allergic rhinitis: Secondary | ICD-10-CM | POA: Diagnosis not present

## 2024-06-16 DIAGNOSIS — J3089 Other allergic rhinitis: Secondary | ICD-10-CM | POA: Diagnosis not present

## 2024-06-16 MED ORDER — AZELASTINE HCL 0.1 % NA SOLN: 1.0000 | Freq: Two times a day (BID) | NASAL | 5 refills | Status: AC | PRN

## 2024-06-16 NOTE — Patient Instructions (Addendum)
 Viral Upper Respiratory Infection If symptoms worsen or has fevers, needs to see PCP or urgent care for viral testing.    Nasal congestion, post nasal dripping, or sinus pressure:  -Use Flonase  1 spray each nostril twice daily.  Aim upward and outward.   -Use Azelastine  2 sprays each nostril twice daily as needed for congestion, drainage, runny nose.  Aim upward and outward.  - Use Mucinex  or Guaifenesin  1 tablet every 12 hours as needed for mucous.    Sore throat:  Use a pain reliever, such as acetaminophen  (Tylenol ) or ibuprofen (Advil).   Gargle with salt water (1/4 tsp of salt dissolved in 8 oz of warm water). Salt water can be used as often as you like.   Throat lozenges or throat sprays (Cepacol lozenges or Chloroseptic spray) may bring some relief by increasing saliva production and lubricating your throat.   Drink plenty of fluids (e.g., water, diluted juice, tea) to soothe your throat and to stay well hydrated.   Coughing:   Medications that contain dextromethorphan (e.g., Robitussin DM, Mucinex  DM, Delsym) may help to suppress a cough.   Tea with honey, when taken regularly, can soothe a sore throat and help suppress a cough. Take care with medications   Strengthen your immune system:   Make sure you eat well (e.g., a healthy, balanced variety of foods).   Avoid alcohol as it can directly suppress various immune responses.   Stay away from cigarettes, and second hand smoke.   Rest as much as possible and get plenty of sleep (at least 8 hours).   Follow up with Dr Luke in February 2026.

## 2024-06-16 NOTE — Progress Notes (Signed)
 FOLLOW UP Date of Service/Encounter:  06/16/24   Subjective:  Francisco Cisneros (DOB: [birth year]) is a 38 y.o. male who [facility] for follow up for an acute visit.   History obtained from: chart review and patient and father.  Reports since Friday, he has noted sneezing, congestion, drainage, coughing.  No SOB/wheezing.  No fevers/sore throat/ear pain.  Notes dad was sick recently.  Has been using Flonase , Albuterol , Mucinex .  Feels fatigued.  Wondering if he needs to wear a mask and what else he can do at this time.    Past Medical History: Past Medical History:  Diagnosis Date   ADHD (attention deficit hyperactivity disorder)    Asthma    Autistic disorder    Eczema     Objective:  BP 122/88 (BP Location: Right Arm, Patient Position: Sitting, Cuff Size: Normal)   Pulse 100   Temp 98.1 F (36.7 C) (Temporal)   Wt 185 lb 12.8 oz (84.3 kg)   SpO2 98%   BMI 28.25 kg/m  Body mass index is 28.25 kg/m. Physical Exam: GEN: alert, well developed HEENT: clear conjunctiva, nose with mild inferior turbinate hypertrophy, pink nasal mucosa, +clear rhinorrhea, + cobblestoning HEART: regular rate and rhythm, no murmur LUNGS: clear to auscultation bilaterally,+ mucoid coughing, unlabored respiration SKIN: no rashes or lesions  Spirometry:  Tracings reviewed. His effort: Good reproducible efforts. FVC: 4.42L, 88% predicted  FEV1: 3.13L, 77% predicted FEV1/FVC ratio: 71% Interpretation: Spirometry consistent with normal pattern.  Please see scanned spirometry results for details.    Assessment:   1. Viral URI with cough   2. Seasonal and perennial allergic rhinitis   3. Mild persistent asthma without complication     Plan/Recommendations:  If symptoms worsen or has fevers, needs to see PCP or urgent care for viral testing.  Spirometry today was normal and no dyspnea/wheezing, low suspicion for asthma flare up.    Nasal  congestion, post nasal dripping, or sinus pressure:  -Use Flonase  1 spray each nostril twice daily.  Aim upward and outward.   -Use Azelastine  2 sprays each nostril twice daily as needed for congestion, drainage, runny nose.  Aim upward and outward.  - Use Mucinex  or Guaifenesin  1 tablet every 12 hours as needed for mucous.    Sore throat:  Use a pain reliever, such as acetaminophen  (Tylenol ) or ibuprofen (Advil).   Gargle with salt water (1/4 tsp of salt dissolved in 8 oz of warm water). Salt water can be used as often as you like.   Throat lozenges or throat sprays (Cepacol lozenges or Chloroseptic spray) may bring some relief by increasing saliva production and lubricating your throat.   Drink plenty of fluids (e.g., water, diluted juice, tea) to soothe your throat and to stay well hydrated.   Coughing:   Medications that contain dextromethorphan (e.g., Robitussin DM, Mucinex  DM, Delsym) may help to suppress a cough.   Tea with honey, when taken regularly, can soothe a sore throat and help suppress a cough. Take care with medications   Strengthen your immune system:   Make sure you eat well (e.g., a healthy, balanced variety of foods).   Avoid alcohol as it can directly suppress various immune responses.   Stay away from cigarettes, and second hand smoke.   Rest as much as possible and get plenty of sleep (at least 8 hours).   Follow up with Dr Luke in February 2026.      Arleta Blanch,  MD [facility] 

## 2024-06-29 ENCOUNTER — Ambulatory Visit (INDEPENDENT_AMBULATORY_CARE_PROVIDER_SITE_OTHER)

## 2024-06-29 DIAGNOSIS — J309 Allergic rhinitis, unspecified: Secondary | ICD-10-CM

## 2024-07-28 ENCOUNTER — Ambulatory Visit

## 2024-07-28 DIAGNOSIS — J302 Other seasonal allergic rhinitis: Secondary | ICD-10-CM | POA: Diagnosis not present

## 2024-07-28 DIAGNOSIS — J309 Allergic rhinitis, unspecified: Secondary | ICD-10-CM

## 2024-08-24 ENCOUNTER — Ambulatory Visit (INDEPENDENT_AMBULATORY_CARE_PROVIDER_SITE_OTHER)

## 2024-08-24 DIAGNOSIS — J302 Other seasonal allergic rhinitis: Secondary | ICD-10-CM | POA: Diagnosis not present

## 2024-09-01 NOTE — Progress Notes (Signed)
 VIALS MADE ON 09/01/24

## 2024-09-14 ENCOUNTER — Ambulatory Visit: Admitting: Allergy

## 2024-11-03 ENCOUNTER — Ambulatory Visit: Admitting: Allergy
